# Patient Record
Sex: Female | Born: 1945 | Marital: Single | Smoking: Former smoker
Health system: Southern US, Community
[De-identification: ages and names within clinical notes are randomized; demographics above are authoritative.]

## PROBLEM LIST (undated history)

## (undated) ENCOUNTER — Emergency Department (HOSPITAL_COMMUNITY): Disposition: A | Discharge status: Other Acute Inpatient Hospital | Payer: Medicare HMO

## (undated) DIAGNOSIS — I639 Cerebral infarction, unspecified: Secondary | ICD-10-CM

## (undated) DIAGNOSIS — C801 Malignant (primary) neoplasm, unspecified: Secondary | ICD-10-CM

## (undated) HISTORY — PX: ANKLE FRACTURE SURGERY: SHX122

---

## 2015-06-02 ENCOUNTER — Emergency Department (HOSPITAL_COMMUNITY): Payer: Medicare HMO

## 2015-06-02 ENCOUNTER — Inpatient Hospital Stay (HOSPITAL_COMMUNITY)
Admission: EM | Admit: 2015-06-02 | Discharge: 2015-06-14 | DRG: 576 | Disposition: A | Discharge status: Skilled Nursing Facility | Payer: Medicare HMO | Attending: Internal Medicine | Admitting: Internal Medicine

## 2015-06-02 ENCOUNTER — Inpatient Hospital Stay (HOSPITAL_COMMUNITY): Payer: Medicare HMO | Admitting: Anesthesiology

## 2015-06-02 ENCOUNTER — Encounter (HOSPITAL_COMMUNITY): Payer: Self-pay | Admitting: Emergency Medicine

## 2015-06-02 ENCOUNTER — Encounter (HOSPITAL_COMMUNITY)
Admission: EM | Disposition: A | Discharge status: Skilled Nursing Facility | Payer: Self-pay | Source: Home / Self Care | Attending: Family Medicine

## 2015-06-02 ENCOUNTER — Encounter: Payer: Self-pay | Admitting: *Deleted

## 2015-06-02 DIAGNOSIS — I779 Disorder of arteries and arterioles, unspecified: Secondary | ICD-10-CM | POA: Diagnosis not present

## 2015-06-02 DIAGNOSIS — I7419 Embolism and thrombosis of other parts of aorta: Secondary | ICD-10-CM | POA: Diagnosis present

## 2015-06-02 DIAGNOSIS — M314 Aortic arch syndrome [Takayasu]: Secondary | ICD-10-CM

## 2015-06-02 DIAGNOSIS — D62 Acute posthemorrhagic anemia: Secondary | ICD-10-CM | POA: Diagnosis not present

## 2015-06-02 DIAGNOSIS — N179 Acute kidney failure, unspecified: Secondary | ICD-10-CM | POA: Diagnosis present

## 2015-06-02 DIAGNOSIS — K5900 Constipation, unspecified: Secondary | ICD-10-CM | POA: Diagnosis not present

## 2015-06-02 DIAGNOSIS — Z6821 Body mass index (BMI) 21.0-21.9, adult: Secondary | ICD-10-CM

## 2015-06-02 DIAGNOSIS — E279 Disorder of adrenal gland, unspecified: Secondary | ICD-10-CM

## 2015-06-02 DIAGNOSIS — Z87891 Personal history of nicotine dependence: Secondary | ICD-10-CM | POA: Diagnosis not present

## 2015-06-02 DIAGNOSIS — E876 Hypokalemia: Secondary | ICD-10-CM | POA: Diagnosis present

## 2015-06-02 DIAGNOSIS — C799 Secondary malignant neoplasm of unspecified site: Secondary | ICD-10-CM | POA: Diagnosis not present

## 2015-06-02 DIAGNOSIS — Z510 Encounter for antineoplastic radiation therapy: Secondary | ICD-10-CM | POA: Diagnosis present

## 2015-06-02 DIAGNOSIS — G936 Cerebral edema: Secondary | ICD-10-CM | POA: Diagnosis present

## 2015-06-02 DIAGNOSIS — N6320 Unspecified lump in the left breast, unspecified quadrant: Secondary | ICD-10-CM | POA: Diagnosis present

## 2015-06-02 DIAGNOSIS — D72829 Elevated white blood cell count, unspecified: Secondary | ICD-10-CM | POA: Diagnosis present

## 2015-06-02 DIAGNOSIS — I361 Nonrheumatic tricuspid (valve) insufficiency: Secondary | ICD-10-CM | POA: Diagnosis not present

## 2015-06-02 DIAGNOSIS — H547 Unspecified visual loss: Secondary | ICD-10-CM | POA: Diagnosis present

## 2015-06-02 DIAGNOSIS — R160 Hepatomegaly, not elsewhere classified: Secondary | ICD-10-CM | POA: Diagnosis not present

## 2015-06-02 DIAGNOSIS — D638 Anemia in other chronic diseases classified elsewhere: Secondary | ICD-10-CM

## 2015-06-02 DIAGNOSIS — E43 Unspecified severe protein-calorie malnutrition: Secondary | ICD-10-CM | POA: Diagnosis present

## 2015-06-02 DIAGNOSIS — D329 Benign neoplasm of meninges, unspecified: Secondary | ICD-10-CM | POA: Diagnosis present

## 2015-06-02 DIAGNOSIS — S21002A Unspecified open wound of left breast, initial encounter: Secondary | ICD-10-CM

## 2015-06-02 DIAGNOSIS — E872 Acidosis: Secondary | ICD-10-CM | POA: Diagnosis present

## 2015-06-02 DIAGNOSIS — N61 Mastitis without abscess: Secondary | ICD-10-CM | POA: Diagnosis present

## 2015-06-02 DIAGNOSIS — C7949 Secondary malignant neoplasm of other parts of nervous system: Secondary | ICD-10-CM

## 2015-06-02 DIAGNOSIS — R001 Bradycardia, unspecified: Secondary | ICD-10-CM | POA: Diagnosis present

## 2015-06-02 DIAGNOSIS — C50919 Malignant neoplasm of unspecified site of unspecified female breast: Secondary | ICD-10-CM | POA: Diagnosis present

## 2015-06-02 DIAGNOSIS — C801 Malignant (primary) neoplasm, unspecified: Secondary | ICD-10-CM | POA: Diagnosis not present

## 2015-06-02 DIAGNOSIS — D7589 Other specified diseases of blood and blood-forming organs: Secondary | ICD-10-CM | POA: Diagnosis present

## 2015-06-02 DIAGNOSIS — I639 Cerebral infarction, unspecified: Secondary | ICD-10-CM | POA: Diagnosis present

## 2015-06-02 DIAGNOSIS — C50112 Malignant neoplasm of central portion of left female breast: Secondary | ICD-10-CM | POA: Diagnosis present

## 2015-06-02 DIAGNOSIS — R7881 Bacteremia: Secondary | ICD-10-CM | POA: Diagnosis not present

## 2015-06-02 DIAGNOSIS — C50912 Malignant neoplasm of unspecified site of left female breast: Secondary | ICD-10-CM | POA: Diagnosis not present

## 2015-06-02 DIAGNOSIS — N644 Mastodynia: Secondary | ICD-10-CM | POA: Diagnosis present

## 2015-06-02 DIAGNOSIS — N63 Unspecified lump in breast: Secondary | ICD-10-CM

## 2015-06-02 DIAGNOSIS — C50812 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: Principal | ICD-10-CM | POA: Diagnosis present

## 2015-06-02 DIAGNOSIS — C50011 Malignant neoplasm of nipple and areola, right female breast: Secondary | ICD-10-CM | POA: Diagnosis not present

## 2015-06-02 DIAGNOSIS — I7410 Embolism and thrombosis of unspecified parts of aorta: Secondary | ICD-10-CM | POA: Diagnosis not present

## 2015-06-02 DIAGNOSIS — C7931 Secondary malignant neoplasm of brain: Secondary | ICD-10-CM | POA: Diagnosis present

## 2015-06-02 DIAGNOSIS — E860 Dehydration: Secondary | ICD-10-CM | POA: Diagnosis present

## 2015-06-02 DIAGNOSIS — Z9012 Acquired absence of left breast and nipple: Secondary | ICD-10-CM | POA: Diagnosis not present

## 2015-06-02 DIAGNOSIS — D509 Iron deficiency anemia, unspecified: Secondary | ICD-10-CM | POA: Diagnosis present

## 2015-06-02 DIAGNOSIS — Z5900 Homelessness unspecified: Secondary | ICD-10-CM

## 2015-06-02 HISTORY — PX: TOTAL MASTECTOMY: SHX6129

## 2015-06-02 LAB — CBC
HCT: 25 % — ABNORMAL LOW (ref 36.0–46.0)
HEMOGLOBIN: 8.1 g/dL — AB (ref 12.0–15.0)
MCH: 24.3 pg — AB (ref 26.0–34.0)
MCHC: 32.4 g/dL (ref 30.0–36.0)
MCV: 75.1 fL — ABNORMAL LOW (ref 78.0–100.0)
Platelets: 512 10*3/uL — ABNORMAL HIGH (ref 150–400)
RBC: 3.33 MIL/uL — AB (ref 3.87–5.11)
RDW: 14.1 % (ref 11.5–15.5)
WBC: 14.2 10*3/uL — AB (ref 4.0–10.5)

## 2015-06-02 LAB — BASIC METABOLIC PANEL
Anion gap: 14 (ref 5–15)
BUN: 29 mg/dL — AB (ref 6–20)
CALCIUM: 8.6 mg/dL — AB (ref 8.9–10.3)
CO2: 19 mmol/L — AB (ref 22–32)
CREATININE: 1.41 mg/dL — AB (ref 0.44–1.00)
Chloride: 103 mmol/L (ref 101–111)
GFR calc Af Amer: 44 mL/min — ABNORMAL LOW (ref >60)
GFR calc non Af Amer: 38 mL/min — ABNORMAL LOW (ref >60)
GLUCOSE: 87 mg/dL (ref 65–99)
Potassium: 4.6 mmol/L (ref 3.5–5.1)
Sodium: 136 mmol/L (ref 135–145)

## 2015-06-02 LAB — RAPID URINE DRUG SCREEN, HOSP PERFORMED
Amphetamines: NOT DETECTED
BARBITURATES: NOT DETECTED
Benzodiazepines: NOT DETECTED
Cocaine: NOT DETECTED
Opiates: NOT DETECTED
Tetrahydrocannabinol: NOT DETECTED

## 2015-06-02 LAB — CBC WITH DIFFERENTIAL/PLATELET
BASOS PCT: 0 %
Basophils Absolute: 0 10*3/uL (ref 0.0–0.1)
EOS ABS: 0 10*3/uL (ref 0.0–0.7)
Eosinophils Relative: 0 %
HCT: 28.8 % — ABNORMAL LOW (ref 36.0–46.0)
Hemoglobin: 9.3 g/dL — ABNORMAL LOW (ref 12.0–15.0)
Lymphocytes Relative: 7 %
Lymphs Abs: 1.2 10*3/uL (ref 0.7–4.0)
MCH: 24.2 pg — AB (ref 26.0–34.0)
MCHC: 32.3 g/dL (ref 30.0–36.0)
MCV: 75 fL — AB (ref 78.0–100.0)
MONOS PCT: 4 %
Monocytes Absolute: 0.8 10*3/uL (ref 0.1–1.0)
NEUTROS ABS: 15.9 10*3/uL — AB (ref 1.7–7.7)
NEUTROS PCT: 89 %
PLATELETS: 553 10*3/uL — AB (ref 150–400)
RBC: 3.84 MIL/uL — ABNORMAL LOW (ref 3.87–5.11)
RDW: 14.2 % (ref 11.5–15.5)
WBC: 17.9 10*3/uL — AB (ref 4.0–10.5)

## 2015-06-02 LAB — COMPREHENSIVE METABOLIC PANEL
ALK PHOS: 103 U/L (ref 38–126)
ALT: 17 U/L (ref 14–54)
ANION GAP: 10 (ref 5–15)
AST: 14 U/L — ABNORMAL LOW (ref 15–41)
Albumin: 1.6 g/dL — ABNORMAL LOW (ref 3.5–5.0)
BUN: 25 mg/dL — ABNORMAL HIGH (ref 6–20)
CALCIUM: 7.5 mg/dL — AB (ref 8.9–10.3)
CO2: 20 mmol/L — AB (ref 22–32)
Chloride: 107 mmol/L (ref 101–111)
Creatinine, Ser: 1.45 mg/dL — ABNORMAL HIGH (ref 0.44–1.00)
GFR, EST AFRICAN AMERICAN: 42 mL/min — AB (ref >60)
GFR, EST NON AFRICAN AMERICAN: 36 mL/min — AB (ref >60)
Glucose, Bld: 113 mg/dL — ABNORMAL HIGH (ref 65–99)
Potassium: 3.3 mmol/L — ABNORMAL LOW (ref 3.5–5.1)
SODIUM: 137 mmol/L (ref 135–145)
Total Bilirubin: 0.5 mg/dL (ref 0.3–1.2)
Total Protein: 5.3 g/dL — ABNORMAL LOW (ref 6.5–8.1)

## 2015-06-02 LAB — HIV ANTIBODY (ROUTINE TESTING W REFLEX): HIV SCREEN 4TH GENERATION: NONREACTIVE

## 2015-06-02 LAB — PROTIME-INR
INR: 1.26 (ref 0.00–1.49)
Prothrombin Time: 15.9 seconds — ABNORMAL HIGH (ref 11.6–15.2)

## 2015-06-02 LAB — GLUCOSE, CAPILLARY: Glucose-Capillary: 133 mg/dL — ABNORMAL HIGH (ref 65–99)

## 2015-06-02 LAB — ABO/RH: ABO/RH(D): O POS

## 2015-06-02 LAB — I-STAT CG4 LACTIC ACID, ED: LACTIC ACID, VENOUS: 1.43 mmol/L (ref 0.5–2.0)

## 2015-06-02 LAB — APTT: aPTT: 45 seconds — ABNORMAL HIGH (ref 24–37)

## 2015-06-02 LAB — ETHANOL

## 2015-06-02 LAB — SURGICAL PCR SCREEN
MRSA, PCR: NEGATIVE
STAPHYLOCOCCUS AUREUS: NEGATIVE

## 2015-06-02 SURGERY — MASTECTOMY, SIMPLE
Anesthesia: General | Site: Breast | Laterality: Left

## 2015-06-02 MED ORDER — EPHEDRINE SULFATE 50 MG/ML IJ SOLN
INTRAMUSCULAR | Status: DC | PRN
  Administered 2015-06-02: 10 mg via INTRAVENOUS

## 2015-06-02 MED ORDER — ACETAMINOPHEN 650 MG RE SUPP: 650.0000 mg | Freq: Four times a day (QID) | RECTAL | Status: DC | PRN

## 2015-06-02 MED ORDER — NEOSTIGMINE METHYLSULFATE 10 MG/10ML IV SOLN
INTRAVENOUS | Status: DC | PRN
Start: 2015-06-02 — End: 2015-06-02
  Administered 2015-06-02: 1 mg via INTRAVENOUS
  Administered 2015-06-02: 4 mg via INTRAVENOUS

## 2015-06-02 MED ORDER — SODIUM CHLORIDE 0.9 % IV SOLN
3.0000 g | Freq: Four times a day (QID) | INTRAVENOUS | Status: DC
  Administered 2015-06-02 – 2015-06-14 (×46): 3 g via INTRAVENOUS
  Filled 2015-06-02 (×53): qty 3

## 2015-06-02 MED ORDER — MORPHINE SULFATE (PF) 2 MG/ML IV SOLN
2.0000 mg | INTRAVENOUS | Status: DC | PRN
  Administered 2015-06-05 – 2015-06-07 (×3): 2 mg via INTRAVENOUS
  Filled 2015-06-02 (×3): qty 1

## 2015-06-02 MED ORDER — ONDANSETRON HCL 4 MG/2ML IJ SOLN
INTRAMUSCULAR | Status: AC
  Filled 2015-06-02: qty 2

## 2015-06-02 MED ORDER — SODIUM CHLORIDE 0.9% FLUSH
3.0000 mL | Freq: Two times a day (BID) | INTRAVENOUS | Status: DC
  Administered 2015-06-02 – 2015-06-13 (×9): 3 mL via INTRAVENOUS

## 2015-06-02 MED ORDER — NEOSTIGMINE METHYLSULFATE 10 MG/10ML IV SOLN
INTRAVENOUS | Status: AC
  Filled 2015-06-02: qty 1

## 2015-06-02 MED ORDER — SODIUM CHLORIDE 0.9 % IV BOLUS (SEPSIS)
2000.0000 mL | Freq: Once | INTRAVENOUS | Status: AC
  Administered 2015-06-02: 2000 mL via INTRAVENOUS

## 2015-06-02 MED ORDER — MIDAZOLAM HCL 2 MG/2ML IJ SOLN
INTRAMUSCULAR | Status: AC
  Filled 2015-06-02: qty 2

## 2015-06-02 MED ORDER — VANCOMYCIN HCL IN DEXTROSE 1-5 GM/200ML-% IV SOLN
1000.0000 mg | INTRAVENOUS | Status: DC
  Administered 2015-06-03 – 2015-06-05 (×3): 1000 mg via INTRAVENOUS
  Filled 2015-06-02 (×3): qty 200

## 2015-06-02 MED ORDER — VANCOMYCIN HCL 10 G IV SOLR
1500.0000 mg | Freq: Once | INTRAVENOUS | Status: AC
  Administered 2015-06-02: 1500 mg via INTRAVENOUS
  Filled 2015-06-02: qty 1500

## 2015-06-02 MED ORDER — OXYCODONE-ACETAMINOPHEN 5-325 MG PO TABS
ORAL_TABLET | ORAL | Status: AC
  Filled 2015-06-02: qty 2

## 2015-06-02 MED ORDER — SUCCINYLCHOLINE CHLORIDE 20 MG/ML IJ SOLN
INTRAMUSCULAR | Status: DC | PRN
  Administered 2015-06-02: 100 mg via INTRAVENOUS

## 2015-06-02 MED ORDER — PROPOFOL 10 MG/ML IV BOLUS
INTRAVENOUS | Status: DC | PRN
  Administered 2015-06-02: 30 mg via INTRAVENOUS
  Administered 2015-06-02: 150 mg via INTRAVENOUS

## 2015-06-02 MED ORDER — OXYCODONE-ACETAMINOPHEN 5-325 MG PO TABS
1.0000 | ORAL_TABLET | ORAL | Status: DC | PRN
  Administered 2015-06-02 – 2015-06-09 (×6): 2 via ORAL
  Administered 2015-06-10 – 2015-06-11 (×3): 1 via ORAL
  Administered 2015-06-12: 2 via ORAL
  Administered 2015-06-12 – 2015-06-13 (×2): 1 via ORAL
  Administered 2015-06-14: 2 via ORAL
  Filled 2015-06-02: qty 2
  Filled 2015-06-02 (×4): qty 1
  Filled 2015-06-02: qty 2
  Filled 2015-06-02: qty 1
  Filled 2015-06-02 (×6): qty 2

## 2015-06-02 MED ORDER — GLYCOPYRROLATE 0.2 MG/ML IJ SOLN
INTRAMUSCULAR | Status: DC | PRN
  Administered 2015-06-02: .15 mg via INTRAVENOUS
  Administered 2015-06-02: 0.6 mg via INTRAVENOUS

## 2015-06-02 MED ORDER — ONDANSETRON HCL 4 MG PO TABS: 4.0000 mg | ORAL_TABLET | Freq: Four times a day (QID) | ORAL | Status: DC | PRN

## 2015-06-02 MED ORDER — FENTANYL CITRATE (PF) 250 MCG/5ML IJ SOLN
INTRAMUSCULAR | Status: AC
  Filled 2015-06-02: qty 5

## 2015-06-02 MED ORDER — FENTANYL CITRATE (PF) 100 MCG/2ML IJ SOLN
INTRAMUSCULAR | Status: DC | PRN
  Administered 2015-06-02: 100 ug via INTRAVENOUS
  Administered 2015-06-02 (×3): 50 ug via INTRAVENOUS

## 2015-06-02 MED ORDER — PIPERACILLIN-TAZOBACTAM 3.375 G IVPB 30 MIN
3.3750 g | Freq: Once | INTRAVENOUS | Status: AC
  Administered 2015-06-02: 3.375 g via INTRAVENOUS
  Filled 2015-06-02: qty 50

## 2015-06-02 MED ORDER — SUCCINYLCHOLINE CHLORIDE 20 MG/ML IJ SOLN
INTRAMUSCULAR | Status: AC
  Filled 2015-06-02: qty 1

## 2015-06-02 MED ORDER — ALBUMIN HUMAN 5 % IV SOLN
INTRAVENOUS | Status: DC | PRN
  Administered 2015-06-02: 13:00:00 via INTRAVENOUS

## 2015-06-02 MED ORDER — ONDANSETRON HCL 4 MG/2ML IJ SOLN: 4.0000 mg | Freq: Four times a day (QID) | INTRAMUSCULAR | Status: DC | PRN

## 2015-06-02 MED ORDER — ROCURONIUM BROMIDE 100 MG/10ML IV SOLN
INTRAVENOUS | Status: DC | PRN
  Administered 2015-06-02: 30 mg via INTRAVENOUS

## 2015-06-02 MED ORDER — LINEZOLID 600 MG/300ML IV SOLN
600.0000 mg | Freq: Once | INTRAVENOUS | Status: AC
  Administered 2015-06-02: 600 mg via INTRAVENOUS
  Filled 2015-06-02: qty 300

## 2015-06-02 MED ORDER — MIDAZOLAM HCL 5 MG/5ML IJ SOLN
INTRAMUSCULAR | Status: DC | PRN
  Administered 2015-06-02: 1 mg via INTRAVENOUS

## 2015-06-02 MED ORDER — ACETAMINOPHEN 325 MG PO TABS: 650.0000 mg | ORAL_TABLET | Freq: Four times a day (QID) | ORAL | Status: DC | PRN

## 2015-06-02 MED ORDER — HYDROCODONE-ACETAMINOPHEN 5-325 MG PO TABS: 1.0000 | ORAL_TABLET | ORAL | Status: DC | PRN

## 2015-06-02 MED ORDER — ALUM & MAG HYDROXIDE-SIMETH 200-200-20 MG/5ML PO SUSP: 30.0000 mL | Freq: Four times a day (QID) | ORAL | Status: DC | PRN

## 2015-06-02 MED ORDER — LINEZOLID 600 MG PO TABS
600.0000 mg | ORAL_TABLET | Freq: Two times a day (BID) | ORAL | Status: DC
  Administered 2015-06-02: 600 mg via ORAL
  Filled 2015-06-02 (×2): qty 1

## 2015-06-02 MED ORDER — LACTATED RINGERS IV SOLN
INTRAVENOUS | Status: DC | PRN
  Administered 2015-06-02 (×2): via INTRAVENOUS

## 2015-06-02 MED ORDER — HYDROMORPHONE HCL 1 MG/ML IJ SOLN
0.2500 mg | INTRAMUSCULAR | Status: DC | PRN
  Administered 2015-06-02 (×2): 0.5 mg via INTRAVENOUS

## 2015-06-02 MED ORDER — DEXAMETHASONE SODIUM PHOSPHATE 10 MG/ML IJ SOLN
INTRAMUSCULAR | Status: DC | PRN
  Administered 2015-06-02: 10 mg via INTRAVENOUS

## 2015-06-02 MED ORDER — HYDROMORPHONE HCL 1 MG/ML IJ SOLN
INTRAMUSCULAR | Status: AC
  Filled 2015-06-02: qty 1

## 2015-06-02 MED ORDER — LIDOCAINE HCL (CARDIAC) 20 MG/ML IV SOLN
INTRAVENOUS | Status: AC
  Filled 2015-06-02: qty 5

## 2015-06-02 MED ORDER — GLYCOPYRROLATE 0.2 MG/ML IJ SOLN
INTRAMUSCULAR | Status: AC
  Filled 2015-06-02: qty 3

## 2015-06-02 MED ORDER — 0.9 % SODIUM CHLORIDE (POUR BTL) OPTIME
TOPICAL | Status: DC | PRN
  Administered 2015-06-02 (×2): 1000 mL

## 2015-06-02 MED ORDER — DEXAMETHASONE SODIUM PHOSPHATE 10 MG/ML IJ SOLN
INTRAMUSCULAR | Status: AC
  Filled 2015-06-02: qty 1

## 2015-06-02 MED ORDER — PROPOFOL 10 MG/ML IV BOLUS
INTRAVENOUS | Status: AC
  Filled 2015-06-02: qty 40

## 2015-06-02 MED ORDER — EPHEDRINE SULFATE 50 MG/ML IJ SOLN
INTRAMUSCULAR | Status: AC
  Filled 2015-06-02: qty 1

## 2015-06-02 MED ORDER — IOHEXOL 300 MG/ML  SOLN
75.0000 mL | Freq: Once | INTRAMUSCULAR | Status: AC | PRN
Start: 2015-06-02 — End: 2015-06-02
  Administered 2015-06-02: 75 mL via INTRAVENOUS

## 2015-06-02 MED ORDER — ROCURONIUM BROMIDE 50 MG/5ML IV SOLN
INTRAVENOUS | Status: AC
  Filled 2015-06-02: qty 1

## 2015-06-02 MED ORDER — PROMETHAZINE HCL 25 MG/ML IJ SOLN: 6.2500 mg | INTRAMUSCULAR | Status: DC | PRN

## 2015-06-02 MED ORDER — SODIUM CHLORIDE 0.9 % IV SOLN
INTRAVENOUS | Status: DC
  Administered 2015-06-02: 05:00:00 via INTRAVENOUS
  Administered 2015-06-03: 100 mL via INTRAVENOUS
  Administered 2015-06-05 – 2015-06-10 (×3): via INTRAVENOUS

## 2015-06-02 MED ORDER — LIDOCAINE HCL (CARDIAC) 20 MG/ML IV SOLN
INTRAVENOUS | Status: DC | PRN
  Administered 2015-06-02: 60 mg via INTRAVENOUS

## 2015-06-02 SURGICAL SUPPLY — 43 items
APPLIER CLIP 9.375 MED OPEN (MISCELLANEOUS) ×6
BINDER BREAST LRG (GAUZE/BANDAGES/DRESSINGS) IMPLANT
BINDER BREAST XLRG (GAUZE/BANDAGES/DRESSINGS) IMPLANT
CANISTER SUCTION 2500CC (MISCELLANEOUS) ×3 IMPLANT
CANISTER WOUND CARE 500ML ATS (WOUND CARE) ×3 IMPLANT
CHLORAPREP W/TINT 26ML (MISCELLANEOUS) IMPLANT
CLIP APPLIE 9.375 MED OPEN (MISCELLANEOUS) ×2 IMPLANT
COVER SURGICAL LIGHT HANDLE (MISCELLANEOUS) ×3 IMPLANT
DRAIN CHANNEL 19F RND (DRAIN) IMPLANT
DRAPE LAPAROSCOPIC ABDOMINAL (DRAPES) ×3 IMPLANT
DRAPE UTILITY XL STRL (DRAPES) IMPLANT
DRSG VAC ATS LRG SENSATRAC (GAUZE/BANDAGES/DRESSINGS) ×3 IMPLANT
ELECT CAUTERY BLADE 6.4 (BLADE) ×3 IMPLANT
ELECT REM PT RETURN 9FT ADLT (ELECTROSURGICAL) ×3
ELECTRODE REM PT RTRN 9FT ADLT (ELECTROSURGICAL) ×1 IMPLANT
EVACUATOR SILICONE 100CC (DRAIN) IMPLANT
GLOVE BIO SURGEON STRL SZ 6 (GLOVE) ×3 IMPLANT
GLOVE BIO SURGEON STRL SZ7 (GLOVE) ×12 IMPLANT
GLOVE BIOGEL PI IND STRL 6.5 (GLOVE) ×3 IMPLANT
GLOVE BIOGEL PI IND STRL 7.0 (GLOVE) ×1 IMPLANT
GLOVE BIOGEL PI IND STRL 7.5 (GLOVE) ×1 IMPLANT
GLOVE BIOGEL PI INDICATOR 6.5 (GLOVE) ×6
GLOVE BIOGEL PI INDICATOR 7.0 (GLOVE) ×2
GLOVE BIOGEL PI INDICATOR 7.5 (GLOVE) ×2
GLOVE SURG SS PI 6.5 STRL IVOR (GLOVE) ×3 IMPLANT
GOWN STRL REUS W/ TWL LRG LVL3 (GOWN DISPOSABLE) ×4 IMPLANT
GOWN STRL REUS W/ TWL XL LVL3 (GOWN DISPOSABLE) ×1 IMPLANT
GOWN STRL REUS W/TWL LRG LVL3 (GOWN DISPOSABLE) ×8
GOWN STRL REUS W/TWL XL LVL3 (GOWN DISPOSABLE) ×2
KIT BASIN OR (CUSTOM PROCEDURE TRAY) ×3 IMPLANT
KIT ROOM TURNOVER OR (KITS) ×3 IMPLANT
NS IRRIG 1000ML POUR BTL (IV SOLUTION) ×3 IMPLANT
PACK GENERAL/GYN (CUSTOM PROCEDURE TRAY) ×3 IMPLANT
PAD ARMBOARD 7.5X6 YLW CONV (MISCELLANEOUS) ×3 IMPLANT
SPECIMEN JAR LARGE (MISCELLANEOUS) ×3 IMPLANT
SPECIMEN JAR X LARGE (MISCELLANEOUS) IMPLANT
SPONGE GAUZE 4X4 12PLY STER LF (GAUZE/BANDAGES/DRESSINGS) IMPLANT
SPONGE LAP 18X18 X RAY DECT (DISPOSABLE) ×3 IMPLANT
SUT ETHILON 3 0 FSL (SUTURE) IMPLANT
SUT VIC AB 3-0 SH 18 (SUTURE) ×6 IMPLANT
TOWEL OR 17X24 6PK STRL BLUE (TOWEL DISPOSABLE) ×3 IMPLANT
TOWEL OR 17X26 10 PK STRL BLUE (TOWEL DISPOSABLE) ×3 IMPLANT
WATER STERILE IRR 1000ML POUR (IV SOLUTION) ×3 IMPLANT

## 2015-06-02 NOTE — Care Management Note (Signed)
Case Management Note  Patient Details  Name: Haneen Ratts MRN: [id number] Date of Birth: 01/20/1946  Subjective/Objective:     Adm w breast infection               Action/Plan: lives in car per chart, sw ref made   Expected Discharge Date:                  Expected Discharge Plan:     In-House Referral:  Clinical Social Work  Discharge planning Services  CM Consult  Post Acute Care Choice:    Choice offered to:     DME Arranged:    DME Agency:     HH Arranged:    [place] Agency:     Status of Service:     Medicare Important Message Given:    Date Medicare IM Given:    Medicare IM give by:    Date Additional Medicare IM Given:    Additional Medicare Important Message give by:     If discussed at [place] of Stay Meetings, dates discussed:    Additional Comments: ur review done  Lacretia Leigh, RN 06/02/2015, 8:22 AM

## 2015-06-02 NOTE — Consult Note (Signed)
Reason for Referral: Breast mass.   HPI: 70 year old woman currently of [place] and have not been seeking medical care routinely. She had been homeless and living in her truck for the last few months. She was seen in the emergency department on 06/01/2015 after she noticed a left breast lesion and purulent drainage. She was found to have a large necrotic left breast mass as well as leukocytosis, thrombocytosis and fever. CT scan of the chest showed a large breast mass consistent with malignancy with axillary lymphadenopathy. She was also noted to have 3 cm visitation attached to the ascending aortic wall. There are intermediate liver and adrenal masses suspicious for malignancy. Infectious disease evaluated the patient for possible endocarditis and have been started on broad-spectrum antibiotics. Patient was evaluated by Gen. surgery and underwent salvage mastectomy and wound VAC placement.   She did report fevers, chills and 20 pound weight loss. The remaining review of system was not able to be obtained. She is not able to give much history at this time as she is recovering from her recent surgery and anesthesia.    History reviewed. No pertinent past medical history.:  Past Surgical History  Procedure Laterality Date  . Ankle fracture surgery    :   Current facility-administered medications:  .  0.9 %  sodium chloride infusion, , Intravenous, Continuous, Ivor Costa, MD, Stopped at 06/02/15 1215 .  0.9 % irrigation (POUR BTL), , , PRN, Donnie Mesa, MD, 1,000 mL at 06/02/15 1257 .  [MAR Hold] acetaminophen (TYLENOL) tablet 650 mg, 650 mg, Oral, Q6H PRN **OR** [MAR Hold] acetaminophen (TYLENOL) suppository 650 mg, 650 mg, Rectal, Q6H PRN, Ivor Costa, MD .  Doug Sou Hold] alum & mag hydroxide-simeth (MAALOX/MYLANTA) 200-200-20 MG/5ML suspension 30 mL, 30 mL, Oral, Q6H PRN, Ivor Costa, MD .  Doug Sou Hold] Ampicillin-Sulbactam (UNASYN) 3 g in sodium chloride 0.9 % 100 mL IVPB, 3 g, Intravenous, Q6H,  Ivor Costa, MD, 3 g at 06/02/15 0644 .  [MAR Hold] HYDROcodone-acetaminophen (NORCO/VICODIN) 5-325 MG per tablet 1-2 tablet, 1-2 tablet, Oral, Q4H PRN, Ivor Costa, MD .  Doug Sou Hold] linezolid (ZYVOX) tablet 600 mg, 600 mg, Oral, Q12H, Ivor Costa, MD .  Doug Sou Hold] morphine 2 MG/ML injection 2 mg, 2 mg, Intravenous, Q4H PRN, Ivor Costa, MD .  Doug Sou Hold] ondansetron [facility]) tablet 4 mg, 4 mg, Oral, Q6H PRN **OR** [MAR Hold] ondansetron (ZOFRAN) injection 4 mg, 4 mg, Intravenous, Q6H PRN, Ivor Costa, MD .  Doug Sou Hold] sodium chloride flush (NS) 0.9 % injection 3 mL, 3 mL, Intravenous, Q12H, Ivor Costa, MD  Facility-Administered Medications Ordered in Other Encounters:  .  albumin human 5 % solution, , , Continuous PRN, Terrill Mohr, CRNA, Stopped at 06/02/15 1302 .  dexamethasone (DECADRON) injection, , , Anesthesia Intra-op, Terrill Mohr, CRNA, 10 mg at 06/02/15 1220 .  ePHEDrine injection, , , Anesthesia Intra-op, Terrill Mohr, CRNA, 10 mg at 06/02/15 1302 .  fentaNYL (SUBLIMAZE) injection, , , Anesthesia Intra-op, Terrill Mohr, CRNA, 50 mcg at 06/02/15 1249 .  glycopyrrolate (ROBINUL) injection, , , Anesthesia Intra-op, Terrill Mohr, CRNA, 0.6 mg at 06/02/15 1318 .  lactated ringers infusion, , , Continuous PRN, Terrill Mohr, CRNA .  lidocaine (cardiac) 100 mg/75ml (XYLOCAINE) 20 MG/ML injection 2%, , , Anesthesia Intra-op, Terrill Mohr, CRNA, 60 mg at 06/02/15 1153 .  midazolam (VERSED) 5 MG/5ML injection, , , Anesthesia Intra-op, Terrill Mohr, CRNA, 1 mg at 06/02/15 1145 .  neostigmine (BLOXIVERZ) injection, ,  Intravenous, Anesthesia Intra-op, Terrill Mohr, CRNA, 4 mg at 06/02/15 1318 .  propofol (DIPRIVAN) 10 mg/mL bolus/IV push, , , Anesthesia Intra-op, Terrill Mohr, CRNA, 30 mg at 06/02/15 1209 .  rocuronium (ZEMURON) injection, , , Anesthesia Intra-op, Terrill Mohr, CRNA, 30 mg at 06/02/15 1209 .  succinylcholine (ANECTINE) injection, , , Anesthesia Intra-op, Terrill Mohr, CRNA, 100 mg at  06/02/15 1156:  No Known Allergies:  History reviewed. No pertinent family history.:  Social History   Social History  . Marital Status: Single    Spouse Name: N/A  . Number of Children: N/A  . Years of Education: N/A   Occupational History  . Not on file.   Social History Main Topics  . Smoking status: Former Research scientist (life sciences)  . Smokeless tobacco: Not on file  . Alcohol Use: No  . Drug Use: No  . Sexual Activity: Not on file   Other Topics Concern  . Not on file   Social History Narrative  . No narrative on file  :  Pertinent items are noted in HPI.  Exam: ECOG 1 Blood pressure 122/68, pulse 77, temperature 98.8 F (37.1 C), temperature source Oral, resp. rate 19, height 5\' 8"  (1.727 m), weight 139 lb 12.4 oz (63.4 kg), SpO2 100 %. General appearance: Slightly somnolent but appeared without distress. She was answering some questions appropriately. Head: Normocephalic, without obvious abnormality Throat: lips, mucosa, and tongue normal; teeth and gums normal no oral ulcers or lesions. Neck: no adenopathy Back: negative Resp: clear to auscultation bilaterally Chest wall: no tenderness. No tenderness or masses noted. Breasts: Normal right breast. Left breast status post mastectomy with a wound VAC in place. Cardio: regular rate and rhythm, S1, S2 normal, no murmur, click, rub or gallop Extremities: extremities normal, atraumatic, no cyanosis or edema Pulses: 2+ and symmetric Skin: Skin color, texture, turgor normal. No rashes or lesions Lymph nodes: Cervical, supraclavicular, and axillary nodes normal.   Recent Labs  06/02/15 0120 06/02/15 0540  WBC 17.9* 14.2*  HGB 9.3* 8.1*  HCT 28.8* 25.0*  PLT 553* 512*    Recent Labs  06/02/15 0120 06/02/15 0540  NA 136 137  K 4.6 3.3*  CL 103 107  CO2 19* 20*  GLUCOSE 87 113*  BUN 29* 25*  CREATININE 1.41* 1.45*  CALCIUM 8.6* 7.5*      Ct Chest W Contrast  06/02/2015  CLINICAL DATA:  Evaluate for left breast  mass versus malignancy. EXAM: CT CHEST WITH CONTRAST TECHNIQUE: Multidetector CT imaging of the chest was performed during intravenous contrast administration. CONTRAST:  64mL OMNIPAQUE IOHEXOL 300 MG/ML  SOLN COMPARISON:  None. FINDINGS: THORACIC INLET/BODY WALL: The entire left breast is affected by a broad-based ulcerated mass with peripheral enhancement, heaped up margins, and nonenhancing debris in the ulcer base. Maximal diameter is approximately 13 cm. No drainable fluid collection. This is has the appearance of a necrotic malignancy. There is likely involvement of the pectoralis muscles but no bony invasion noted. Left axillary nodes are within normal limits in size, but asymmetric and suspicious soley based on the size of breast mass. No right breast lesion is seen. MEDIASTINUM: No cardiomegaly or pericardial effusion. Atherosclerosis, including the coronary arteries. There is a hypodense filling defect attached to the left wall of the ascending aorta which measures up to 3 cm. On reformats, this appears to have a narrow base along the wall which is not otherwise remarkable for thickening or calcification. Atherosclerosis is present along the aorta and  great vessels, without dissection or other aerated notable mural thrombus. There is no indication of valvular disease/vegetation. No lymphadenopathy, including along the left IMA distribution LUNG WINDOWS: Mild centrilobular emphysema. There is no edema, consolidation, effusion, or pneumothorax. No suspicious nodule. UPPER ABDOMEN: There is a 10 mm low-density in the upper left liver which does not measured cystic density. Indeterminate 13 mm right adrenal mass. OSSEOUS: No acute fracture.  No suspicious lytic or blastic lesions. IMPRESSION: 1. 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 As questioned clinically, no abscess.Small but asymmetric left axillary lymph nodes. 2. 3 cm vegetation attached to the ascending aortic wall. This would be unusual  for bland atherosclerotic thrombus, correlate for systemic infection/bacteremia. 3. Indeterminate liver and right adrenal masses. Recommend abdominal staging after workup of #1. Electronically Signed   By: Monte Fantasia M.D.   On: 06/02/2015 03:24    Assessment and Plan:   70 year old woman with the following issues:  1. Left breast mass presented with a large fungating tumor. This will likely be locally advanced breast cancer and possibly metastatic if imaging studies of the abdomen and pelvis showed possible adrenal and liver metastasis. She is s/p salvage mastectomy for palliative purposes given the large tumor and the severe infection and impending sepsis.  After the pathology is available, the characteristics of the tumor will certainly dictate therapeutic options. It is very likely we are dealing with  incurable malignancy and likely we are dealing with more palliative options. If it is a hormone positive tumor, her disease can be palliated for an extended period of time using hormone therapy.  It is also possible also that we are dealing with a rather aggressive triple negative tumor with widespread metastasis that carries a rather poor prognosis.  Any palliative options to be offered will be at a later date once she recovered from surgery and have recovered from her recent infections.  She will definitely require aggressive psychosocial support before any treatment take place given her desperate social situation.   2. Aortic arch vegetation: Further workup and evaluation will require a TTE December recommendation from infectious disease.  3. Leukocytosis: Likely related to infectious process and reactive related to malignancy.  4. Microcytic anemia: Likely multifactorial with element of possible iron deficiency versus nutritional deficiency and chronic disease. Iron studies will be helpful to replace any deficiencies.  5. Prognosis: This will be discussed at a later date once more  information is available.   She would benefit from palliative medicine consult to help with goals of care early on in the process. She will also benefit from social work evaluation to assist her moving forward upon discharge.

## 2015-06-02 NOTE — Evaluation (Signed)
Physical Therapy Evaluation Patient Details Name: Robin Cantrell MRN: [id number] DOB: 1946/03/20 Today's Date: 06/02/2015   History of Present Illness  patient is a 70 yo female whoe presents with Infection of left breast, possible left breast cancer and vegetation in ascending aorta.  Clinical Impression  Patient demonstrates deficits in functional mobility as indicated below. Patient will benefit from continued skilled PT to address deficits and maximize function. Will see as indicated and progress as tolerated.  OF NOTE: Patient reporting increased dizziness (light headed) with blurred vision during mobility. BPs assessed (negative orthostatic response). As patient returned to room and was instructed to sit in chair, patient attempted to sit on bedside table, states she could not see chair.     Follow Up Recommendations SNF;Supervision/Assistance - 24 hour    Equipment Recommendations  Other (comment) (TBD)    Recommendations for Other Services       Precautions / Restrictions Precautions Precautions: Fall Restrictions Weight Bearing Restrictions: No      Mobility  Bed Mobility Overal bed mobility: Needs Assistance Bed Mobility: Supine to Sit     Supine to sit: Min guard     General bed mobility comments: VCs for safety, patient impulsive with poor understanding of environment  Transfers Overall transfer level: Needs assistance Equipment used: 1 person hand held assist Transfers: Sit to/from [facility] Sit to Stand: Min assist Stand pivot transfers: Min guard       General transfer comment: VCs for hand placement and safety. Patient with some instability upon standing. SPT to [facility] with min guard. Min assist required to stand from [facility] secondary to posterior instability.   Ambulation/Gait Ambulation/Gait assistance: Min assist Ambulation Distance (Feet): 40 Feet Assistive device: 1 person hand held assist Gait Pattern/deviations: Step-through  pattern;Decreased stride length;Scissoring;Narrow base of support;Drifts right/left Gait velocity: decreased Gait velocity interpretation: Below normal speed for age/gender General Gait Details: instability noted with minimal ambulation. increased instability with increased time in standing. patient reports + dizziness and blurred vision with upright activity. BP assessed in sitting and standing. sitting 11783 HR 92; standing 143/84 HR 100  Stairs            Wheelchair Mobility    Modified Rankin (Stroke Patients Only)       Balance Overall balance assessment: History of Falls                                           Pertinent Vitals/Pain Pain Assessment: 0-10 Pain Score: 7  Pain Location: left breast/chest Pain Descriptors / Indicators: Discomfort;Grimacing;Guarding;Sore Pain Intervention(s): Limited activity within patient's tolerance;Monitored during session;Repositioned;Relaxation    Home Living Family/patient expects to be discharged to:: Shelter/Homeless                      Prior Function Level of Independence: Independent               Hand Dominance   Dominant Hand: Right    Extremity/Trunk Assessment   Upper Extremity Assessment: Defer to OT evaluation           Lower Extremity Assessment: Generalized weakness      Cervical / Trunk Assessment: Kyphotic  Communication   Communication: No difficulties  Cognition Arousal/Alertness: Awake/alert Behavior During Therapy: Flat affect;Impulsive Overall Cognitive Status: Impaired/Different from baseline Area of Impairment: Attention;Memory;Following commands;Safety/judgement;Awareness;Problem solving   Current Attention Level: Sustained Memory: Decreased  short-term memory Following Commands: Follows one step commands with increased time Safety/Judgement: Decreased awareness of safety;Decreased awareness of deficits Awareness: Emergent Problem Solving: Slow  processing;Decreased initiation;Difficulty sequencing;Requires verbal cues;Requires tactile cues General Comments: patient alert and oriented but very tangential, easily distracted and noted confusion during session.     General Comments      Exercises        Assessment/Plan    PT Assessment Patient needs continued PT services  PT Diagnosis Difficulty walking;Abnormality of gait;Generalized weakness;Acute pain;Altered mental status   PT Problem List Decreased strength;Decreased range of motion;Decreased activity tolerance;Decreased balance;Decreased mobility;Decreased coordination;Decreased cognition;Decreased safety awareness;Decreased skin integrity;Pain  PT Treatment Interventions DME instruction;Gait training;Functional mobility training;Therapeutic activities;Therapeutic exercise;Balance training;Cognitive remediation;Patient/family education   PT Goals (Current goals can be found in the Care Plan section) Acute Rehab PT Goals Patient Stated Goal: to get better PT Goal Formulation: With patient Time For Goal Achievement: 06/16/15 Potential to Achieve Goals: Good    Frequency Min 2X/week   Barriers to discharge Decreased caregiver support      Co-evaluation               End of Session Equipment Utilized During Treatment: Gait belt Activity Tolerance: Patient tolerated treatment well;Patient limited by fatigue Patient left: in chair;with call bell/phone within reach;with chair alarm set Nurse Communication: Mobility status         Time: [id number] PT Time Calculation (min) (ACUTE ONLY): 23 min   Charges:   PT Evaluation $PT Eval Moderate Complexity: 1 Procedure PT Treatments $Therapeutic Activity: 8-22 mins   PT G CodesDuncan Dull June 15, 2015, 9:23 AM  Alben Deeds, PT DPT  [phone]

## 2015-06-02 NOTE — Anesthesia Procedure Notes (Signed)
Procedure Name: Intubation Date/Time: 06/02/2015 11:57 AM Performed by: Terrill Mohr Pre-anesthesia Checklist: Emergency Drugs available, Patient identified, Suction available and Patient being monitored Patient Re-evaluated:Patient Re-evaluated prior to inductionOxygen Delivery Method: Circle system utilized Preoxygenation: Pre-oxygenation with 100% oxygen Intubation Type: IV induction Ventilation: Mask ventilation without difficulty Laryngoscope Size: Mac and 3 Grade View: Grade I Tube type: Oral Tube size: 7.5 mm Number of attempts: 1 Airway Equipment and Method: Stylet Placement Confirmation: ETT inserted through vocal cords under direct vision,  breath sounds checked- equal and bilateral and positive ETCO2 Secured at: 22 (cm upper gum) cm Tube secured with: Tape Dental Injury: Teeth and Oropharynx as per pre-operative assessment

## 2015-06-02 NOTE — Consult Note (Signed)
Reason for Consult: left necrotic breast mass Referring Physician: Dr. Verneita Griffes   HPI: Robin Cantrell is a 70 year old female with no significant medical history expect homelessness.  She presented this AM with left breast pain.  Initially started as "a boil" about 5 months ago and progressively worsened.  She is unable to provide additional information regarding when it increased or became malodorous.  She did not nipple "sunk down" after it initially started.  Reports about 20lb of unintentional weight loss in 5 months along with chills and sweats.  Denies family history of breast cancer stating most family members died of heart failure.  She has children, but is not in contact with them.  Initial ED work up reveals WBC 17.9k afebrile, sCr 1.41.  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   We have therefore been asked to evaluate.    Repeat CBC shows a decrease in WBC to 14.2k.  Started on Unasyn and zyvox.    History reviewed. No pertinent past medical history.  Past Surgical History  Procedure Laterality Date  . Ankle fracture surgery      History reviewed. No pertinent family history.  Social History:  reports that she has quit smoking. She does not have any smokeless tobacco history on file. She reports that she does not drink alcohol or use illicit drugs.  Allergies: No Known Allergies  Medications:  Scheduled Meds: . ampicillin-sulbactam (UNASYN) IV  3 g Intravenous Q6H  . linezolid  600 mg Oral Q12H  . sodium chloride flush  3 mL Intravenous Q12H   Continuous Infusions: . sodium chloride 100 mL/hr at 06/02/15 0521   PRN Meds:.acetaminophen **OR** acetaminophen, alum & mag hydroxide-simeth, HYDROcodone-acetaminophen, morphine injection, ondansetron **OR** ondansetron (ZOFRAN) IV   Results for orders  placed or performed during the hospital encounter of 06/02/15 (from the past 48 hour(s))  CBC with Differential/Platelet     Status: Abnormal   Collection Time: 06/02/15  1:20 AM  Result Value Ref Range   WBC 17.9 (H) 4.0 - 10.5 K/uL   RBC 3.84 (L) 3.87 - 5.11 MIL/uL   Hemoglobin 9.3 (L) 12.0 - 15.0 g/dL   HCT 28.8 (L) 36.0 - 46.0 %   MCV 75.0 (L) 78.0 - 100.0 fL   MCH 24.2 (L) 26.0 - 34.0 pg   MCHC 32.3 30.0 - 36.0 g/dL   RDW 14.2 11.5 - 15.5 %   Platelets 553 (H) 150 - 400 K/uL   Neutrophils Relative % 89 %   Neutro Abs 15.9 (H) 1.7 - 7.7 K/uL   Lymphocytes Relative 7 %   Lymphs Abs 1.2 0.7 - 4.0 K/uL   Monocytes Relative 4 %   Monocytes Absolute 0.8 0.1 - 1.0 K/uL   Eosinophils Relative 0 %   Eosinophils Absolute 0.0 0.0 - 0.7 K/uL   Basophils Relative 0 %   Basophils Absolute 0.0 0.0 - 0.1 K/uL  Basic metabolic panel     Status: Abnormal   Collection Time: 06/02/15  1:20 AM  Result Value Ref Range   Sodium 136 135 - 145 mmol/L   Potassium 4.6 3.5 - 5.1 mmol/L   Chloride 103 101 - 111 mmol/L   CO2 19 (L) 22 - 32 mmol/L   Glucose, Bld 87 65 - 99 mg/dL   BUN 29 (H) 6 - 20 mg/dL   Creatinine, Ser 1.41 (H)  0.44 - 1.00 mg/dL   Calcium 8.6 (L) 8.9 - 10.3 mg/dL   GFR calc non Af Amer 38 (L) >60 mL/min   GFR calc Af Amer 44 (L) >60 mL/min    Comment: (NOTE) The eGFR has been calculated using the CKD EPI equation. This calculation has not been validated in all clinical situations. eGFR's persistently <60 mL/min signify possible Chronic Kidney Disease.    Anion gap 14 5 - 15  Ethanol     Status: None   Collection Time: 06/02/15  1:20 AM  Result Value Ref Range   Alcohol, Ethyl (B) <5 <5 mg/dL    Comment:        LOWEST DETECTABLE LIMIT FOR SERUM ALCOHOL IS 5 mg/dL FOR MEDICAL PURPOSES ONLY   I-Stat CG4 Lactic Acid, ED     Status: None   Collection Time: 06/02/15  1:26 AM  Result Value Ref Range   Lactic Acid, Venous 1.43 0.5 - 2.0 mmol/L  Urine rapid drug screen (hosp  performed)     Status: None   Collection Time: 06/02/15  3:55 AM  Result Value Ref Range   Opiates NONE DETECTED NONE DETECTED   Cocaine NONE DETECTED NONE DETECTED   Benzodiazepines NONE DETECTED NONE DETECTED   Amphetamines NONE DETECTED NONE DETECTED   Tetrahydrocannabinol NONE DETECTED NONE DETECTED   Barbiturates NONE DETECTED NONE DETECTED    Comment:        DRUG SCREEN FOR MEDICAL PURPOSES ONLY.  IF CONFIRMATION IS NEEDED FOR ANY PURPOSE, NOTIFY LAB WITHIN 5 DAYS.        LOWEST DETECTABLE LIMITS FOR URINE DRUG SCREEN Drug Class       Cutoff (ng/mL) Amphetamine      1000 Barbiturate      200 Benzodiazepine   213 Tricyclics       086 Opiates          300 Cocaine          300 THC              50   Surgical pcr screen     Status: None   Collection Time: 06/02/15  4:35 AM  Result Value Ref Range   MRSA, PCR NEGATIVE NEGATIVE   Staphylococcus aureus NEGATIVE NEGATIVE    Comment:        The Xpert SA Assay (FDA approved for NASAL specimens in patients over 62 years of age), is one component of a comprehensive surveillance program.  Test performance has been validated by [facility] for patients greater than or equal to 100 year old. It is not intended to diagnose infection nor to guide or monitor treatment.   Protime-INR     Status: Abnormal   Collection Time: 06/02/15  5:40 AM  Result Value Ref Range   Prothrombin Time 15.9 (H) 11.6 - 15.2 seconds   INR 1.26 0.00 - 1.49  APTT     Status: Abnormal   Collection Time: 06/02/15  5:40 AM  Result Value Ref Range   aPTT 45 (H) 24 - 37 seconds    Comment:        IF BASELINE aPTT IS ELEVATED, SUGGEST PATIENT RISK ASSESSMENT BE USED TO DETERMINE APPROPRIATE ANTICOAGULANT THERAPY.   Comprehensive metabolic panel     Status: Abnormal   Collection Time: 06/02/15  5:40 AM  Result Value Ref Range   Sodium 137 135 - 145 mmol/L   Potassium 3.3 (L) 3.5 - 5.1 mmol/L   Chloride 107 101 - 111 mmol/L  CO2 20 (L) 22 - 32  mmol/L   Glucose, Bld 113 (H) 65 - 99 mg/dL   BUN 25 (H) 6 - 20 mg/dL   Creatinine, Ser 1.45 (H) 0.44 - 1.00 mg/dL   Calcium 7.5 (L) 8.9 - 10.3 mg/dL   Total Protein 5.3 (L) 6.5 - 8.1 g/dL   Albumin 1.6 (L) 3.5 - 5.0 g/dL   AST 14 (L) 15 - 41 U/L   ALT 17 14 - 54 U/L   Alkaline Phosphatase 103 38 - 126 U/L   Total Bilirubin 0.5 0.3 - 1.2 mg/dL   GFR calc non Af Amer 36 (L) >60 mL/min   GFR calc Af Amer 42 (L) >60 mL/min    Comment: (NOTE) The eGFR has been calculated using the CKD EPI equation. This calculation has not been validated in all clinical situations. eGFR's persistently <60 mL/min signify possible Chronic Kidney Disease.    Anion gap 10 5 - 15  CBC     Status: Abnormal   Collection Time: 06/02/15  5:40 AM  Result Value Ref Range   WBC 14.2 (H) 4.0 - 10.5 K/uL   RBC 3.33 (L) 3.87 - 5.11 MIL/uL   Hemoglobin 8.1 (L) 12.0 - 15.0 g/dL   HCT 25.0 (L) 36.0 - 46.0 %   MCV 75.1 (L) 78.0 - 100.0 fL   MCH 24.3 (L) 26.0 - 34.0 pg   MCHC 32.4 30.0 - 36.0 g/dL   RDW 14.1 11.5 - 15.5 %   Platelets 512 (H) 150 - 400 K/uL  Glucose, capillary     Status: Abnormal   Collection Time: 06/02/15  7:39 AM  Result Value Ref Range   Glucose-Capillary 133 (H) 65 - 99 mg/dL   Comment 1 Capillary Specimen     Ct Chest W Contrast  06/02/2015  CLINICAL DATA:  Evaluate for left breast mass versus malignancy. EXAM: CT CHEST WITH CONTRAST TECHNIQUE: Multidetector CT imaging of the chest was performed during intravenous contrast administration. CONTRAST:  73m OMNIPAQUE IOHEXOL 300 MG/ML  SOLN COMPARISON:  None. FINDINGS: THORACIC INLET/BODY WALL: The entire left breast is affected by a broad-based ulcerated mass with peripheral enhancement, heaped up margins, and nonenhancing debris in the ulcer base. Maximal diameter is approximately 13 cm. No drainable fluid collection. This is has the appearance of a necrotic malignancy. There is likely involvement of the pectoralis muscles but no bony invasion  noted. Left axillary nodes are within normal limits in size, but asymmetric and suspicious soley based on the size of breast mass. No right breast lesion is seen. MEDIASTINUM: No cardiomegaly or pericardial effusion. Atherosclerosis, including the coronary arteries. There is a hypodense filling defect attached to the left wall of the ascending aorta which measures up to 3 cm. On reformats, this appears to have a narrow base along the wall which is not otherwise remarkable for thickening or calcification. Atherosclerosis is present along the aorta and great vessels, without dissection or other aerated notable mural thrombus. There is no indication of valvular disease/vegetation. No lymphadenopathy, including along the left IMA distribution LUNG WINDOWS: Mild centrilobular emphysema. There is no edema, consolidation, effusion, or pneumothorax. No suspicious nodule. UPPER ABDOMEN: There is a 10 mm low-density in the upper left liver which does not measured cystic density. Indeterminate 13 mm right adrenal mass. OSSEOUS: No acute fracture.  No suspicious lytic or blastic lesions. IMPRESSION: 1. 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 As questioned clinically, no abscess.Small but asymmetric left axillary lymph nodes. 2. 3 cm  vegetation attached to the ascending aortic wall. This would be unusual for bland atherosclerotic thrombus, correlate for systemic infection/bacteremia. 3. Indeterminate liver and right adrenal masses. Recommend abdominal staging after workup of #1. Electronically Signed   By: Monte Fantasia M.D.   On: 06/02/2015 03:24    Review of Systems  Constitutional: Positive for fever, chills, weight loss, malaise/fatigue and diaphoresis.  Respiratory: Positive for shortness of breath. Negative for cough, hemoptysis, sputum production and wheezing.   Cardiovascular: Negative for chest pain, palpitations, orthopnea, claudication, leg swelling and PND.  Gastrointestinal: Positive for  constipation. Negative for heartburn, nausea, vomiting, abdominal pain, diarrhea, blood in stool and melena.  Genitourinary: Negative for dysuria, urgency, frequency, hematuria and flank pain.  Neurological: Negative for dizziness, tingling, tremors, sensory change, speech change, seizures, loss of consciousness, weakness and headaches.  Psychiatric/Behavioral: Negative for suicidal ideas and substance abuse.   Blood pressure 112/66, pulse 80, temperature 98.8 F (37.1 C), temperature source Oral, resp. rate 20, height 5' 8"  (1.727 m), weight 63.4 kg (139 lb 12.4 oz), SpO2 100 %. Physical Exam  Constitutional: She is oriented to person, place, and time. She appears well-developed and well-nourished. No distress.  Cardiovascular: Normal rate, regular rhythm, normal heart sounds and intact distal pulses.  Exam reveals no gallop and no friction rub.   No murmur heard. Respiratory: Effort normal and breath sounds normal. No respiratory distress. She has no wheezes. She has no rales. She exhibits no tenderness.  GI: Soft. Bowel sounds are normal. She exhibits no distension and no mass. There is no tenderness. There is no rebound and no guarding.  Musculoskeletal: Normal range of motion. She exhibits no edema or tenderness.  Neurological: She is alert and oriented to person, place, and time.  Skin: She is not diaphoretic.  Foul smelling left necrotic mass.  See image   Psychiatric: She has a normal mood and affect. Her behavior is normal. Judgment and thought content normal.  The mass is moderately mobile, so it may not be entirely fixed to the chest wall.     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  May need eventual skin graft -oncology consult  -BID wet to dry dressing changes  ID-Unasyn and zyvox Possible  vegetation in ascending aorta-per primary team.  ID consulted, probably needs cardiology evaluation   AKI-per primary team   The surgical procedure has been discussed with the patient.  Potential risks, benefits, alternative treatments, and expected outcomes have been explained.  All of the patient's questions at this time have been answered.  The likelihood of reaching the patient's treatment goal is good.  The patient understand the proposed surgical procedure and wishes to proceed.   Erby Pian ANP-BC Pager [phone] 06/02/2015, 9:35 AM   Imogene Burn. Georgette Dover, MD, [facility]  General/ Trauma Surgery  06/02/2015 10:46 AM

## 2015-06-02 NOTE — ED Notes (Signed)
Pt presents to ER via [facility] from car in [facility]; pt states she is homeless and lives in her car; pt reports "spider bite" to L breast for unknown amount of time; purulent drainage and foul odor noted; pt also reports dehydration and hunger x 3 days

## 2015-06-02 NOTE — Progress Notes (Signed)
Initial Nutrition Assessment  DOCUMENTATION CODES:   Not applicable  INTERVENTION:   Supplement diet once advanced.   NUTRITION DIAGNOSIS:   Increased nutrient needs related to wound healing as evidenced by estimated needs.  GOAL:   Patient will meet greater than or equal to 90% of their needs  MONITOR:   Diet advancement, Supplement acceptance, PO intake, Skin, I & O's  REASON FOR ASSESSMENT:   Malnutrition Screening Tool   ASSESSMENT:   Pt who is homeless admitted with left breast pain which started as a boil about 5 months ago. 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   Per surgery plan for breast removal and wound vac.  Pt currently in OR. Unable to complete Nutrition-Focused physical exam at this time.  Per MD pt has lost 20 lb of unintentional weight loss in the last 5 months with chills and sweats.  Pt at high risk for malnutrition but unable to determine at this time.   Labs reviewed: potassium low 3.3   Diet Order:  Diet NPO time specified Except for: Sips with Meds  Skin:  Wound (see comment) (Necrotic left breast)  Last BM:  unknown  Height:   Ht Readings from Last 1 Encounters:  06/02/15 5\' 8"  (1.727 m)   Weight:   Wt Readings from Last 1 Encounters:  06/02/15 139 lb 12.4 oz (63.4 kg)    Ideal Body Weight:  63.6 kg  BMI:  Body mass index is 21.26 kg/(m^2).  Estimated Nutritional Needs:   Kcal:  1800-2000  Protein:  95-115 grams  Fluid:  > 1.8 L/day  EDUCATION NEEDS:   No education needs identified at this time  [place], [place], [facility] Pager [phone] After Hours Pager

## 2015-06-02 NOTE — ED Provider Notes (Signed)
CSN: [id number]     Arrival date & time 06/02/15  [id number] History  By signing my name below, I, Meriel Pica, attest that this documentation has been prepared under the direction and in the presence of Everlene Balls, MD. Electronically Signed: Meriel Pica, ED Scribe. 06/02/2015. 1:18 AM.   Chief Complaint  Patient presents with  . Breast Problem    The history is provided by the EMS personnel. The history is limited by the condition of the patient. No language interpreter was used.   LEVEL 5 CAVEAT: ALTERED MENTAL STATUS   HPI Comments: Robin Cantrell is a 70 y.o. female, with no known history, brought in by ambulance, who presents to the Emergency Department for evaluation of a possible spider bite sustained to left upper chest with timeframe unknown. Tthere is a bandage placed over the area that is saturated with purulent drainage. No other details are known.   History reviewed. No pertinent past medical history. Past Surgical History  Procedure Laterality Date  . Ankle fracture surgery     History reviewed. No pertinent family history. Social History  Substance Use Topics  . Smoking status: Former Research scientist (life sciences)  . Smokeless tobacco: None  . Alcohol Use: No   OB History    No data available     Review of Systems  Unable to perform ROS: Mental status change    Allergies  Review of patient's allergies indicates no known allergies.  Home Medications   Prior to Admission medications   Not on File   BP 141/84 mmHg  Pulse 69  Temp(Src) 98.2 F (36.8 C) (Oral)  Resp 18  SpO2 100% Physical Exam  Constitutional: She is oriented to person, place, and time. Vital signs are normal. She appears well-developed and well-nourished.  Non-toxic appearance. She does not appear ill. No distress.  Disheveled appearance  HENT:  Head: Normocephalic and atraumatic.  Nose: Nose normal.  Mouth/Throat: Oropharynx is clear and moist. No oropharyngeal exudate.  Eyes: Conjunctivae and EOM  are normal. Pupils are equal, round, and reactive to light. No scleral icterus.  Neck: Normal range of motion. Neck supple. No tracheal deviation, no edema, no erythema and normal range of motion present. No thyroid mass and no thyromegaly present.  Cardiovascular: Normal rate, regular rhythm, S1 normal, S2 normal, normal heart sounds, intact distal pulses and normal pulses.  Exam reveals no gallop and no friction rub.   No murmur heard. Pulmonary/Chest: Effort normal and breath sounds normal. No respiratory distress. She has no wheezes. She has no rhonchi. She has no rales. She exhibits tenderness.  Abdominal: Soft. Normal appearance and bowel sounds are normal. He exhibits no distension, no ascites and no mass. There is no hepatosplenomegaly. There is no tenderness. There is no rebound, no guarding and no CVA tenderness.  Musculoskeletal: Normal range of motion. He exhibits no edema or tenderness.  Lymphadenopathy:    He has no cervical adenopathy.  Neurological: He is alert and oriented to person, place, and time. He has normal strength. No cranial nerve deficit or sensory deficit.  Skin: Skin is warm, dry and intact. No petechiae and no rash noted. She is not diaphoretic. No erythema. No pallor.  Large necrotic, purulent wound encompassing the entire left breast. It is malodorous and draining pus. There is tenderness to palpation.  Nursing note and vitals reviewed.   ED Course  Procedures  DIAGNOSTIC STUDIES: Oxygen Saturation is 100% on RA, normal by my interpretation.    COORDINATION OF CARE: 12:59 AM Discussed  treatment plan with pt. Pt acknowledges and agrees to plan.   Labs Review Labs Reviewed  CBC WITH DIFFERENTIAL/PLATELET - Abnormal; Notable for the following:    WBC 17.9 (*)    RBC 3.84 (*)    Hemoglobin 9.3 (*)    HCT 28.8 (*)    MCV 75.0 (*)    MCH 24.2 (*)    Platelets 553 (*)    Neutro Abs 15.9 (*)    All other components within normal limits  BASIC METABOLIC  PANEL - Abnormal; Notable for the following:    CO2 19 (*)    BUN 29 (*)    Creatinine, Ser 1.41 (*)    Calcium 8.6 (*)    GFR calc non Af Amer 38 (*)    GFR calc Af Amer 44 (*)    All other components within normal limits  CULTURE, BLOOD (ROUTINE X 2)  CULTURE, BLOOD (ROUTINE X 2)  ETHANOL  URINE RAPID DRUG SCREEN, HOSP PERFORMED  I-STAT CG4 LACTIC ACID, ED    Imaging Review Ct Chest W Contrast  06/02/2015  CLINICAL DATA:  Evaluate for left breast mass versus malignancy. EXAM: CT CHEST WITH CONTRAST TECHNIQUE: Multidetector CT imaging of the chest was performed during intravenous contrast administration. CONTRAST:  63mL OMNIPAQUE IOHEXOL 300 MG/ML  SOLN COMPARISON:  None. FINDINGS: THORACIC INLET/BODY WALL: The entire left breast is affected by a broad-based ulcerated mass with peripheral enhancement, heaped up margins, and nonenhancing debris in the ulcer base. Maximal diameter is approximately 13 cm. No drainable fluid collection. This is has the appearance of a necrotic malignancy. There is likely involvement of the pectoralis muscles but no bony invasion noted. Left axillary nodes are within normal limits in size, but asymmetric and suspicious soley based on the size of breast mass. No right breast lesion is seen. MEDIASTINUM: No cardiomegaly or pericardial effusion. Atherosclerosis, including the coronary arteries. There is a hypodense filling defect attached to the left wall of the ascending aorta which measures up to 3 cm. On reformats, this appears to have a narrow base along the wall which is not otherwise remarkable for thickening or calcification. Atherosclerosis is present along the aorta and great vessels, without dissection or other aerated notable mural thrombus. There is no indication of valvular disease/vegetation. No lymphadenopathy, including along the left IMA distribution LUNG WINDOWS: Mild centrilobular emphysema. There is no edema, consolidation, effusion, or pneumothorax. No  suspicious nodule. UPPER ABDOMEN: There is a 10 mm low-density in the upper left liver which does not measured cystic density. Indeterminate 13 mm right adrenal mass. OSSEOUS: No acute fracture.  No suspicious lytic or blastic lesions. IMPRESSION: 1. 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 As questioned clinically, no abscess.Small but asymmetric left axillary lymph nodes. 2. 3 cm vegetation attached to the ascending aortic wall. This would be unusual for bland atherosclerotic thrombus, correlate for systemic infection/bacteremia. 3. Indeterminate liver and right adrenal masses. Recommend abdominal staging after workup of #1. Electronically Signed   By: Monte Fantasia M.D.   On: 06/02/2015 03:24   I have personally reviewed and evaluated these images and lab results as part of my medical decision-making.   MDM   Final diagnoses:  None    Patient presents to the emergency department for weakness and hunger. She is homeless. Physical exam reveals a large necrotic mass of the left breast. I concern for possible malignancy versus abscess. Will obtain CT scan of the chest for evaluation. Laboratory studies show white blood cell count  of 17.9. Patient was given vancomycin and Zosyn for treatment of possible infection.  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  Patient will require admission for further work up.  [facility] hospitalist have ben paged. Dr. Porfirio Mylar accepts the patient to step down  I personally performed the services described in this documentation, which was scribed in my presence. The recorded information has been reviewed and is accurate.      Everlene Balls, MD 06/02/15 [phone]

## 2015-06-02 NOTE — Transfer of Care (Signed)
Immediate Anesthesia Transfer of Care Note  Patient: Robin Cantrell  Procedure(s) Performed: Procedure(s): LEFT TOTAL MASTECTOMY (Left)  Patient Location: PACU  Anesthesia Type:General  Level of Consciousness: awake, sedated and patient cooperative  Airway & Oxygen Therapy: Patient Spontanous Breathing and Patient connected to face mask oxygen  Post-op Assessment: Report given to RN, Post -op Vital signs reviewed and stable and Patient moving all extremities  Post vital signs: Reviewed and stable  Last Vitals:  Filed Vitals:   06/02/15 0900 06/02/15 1100  BP: 122/68   Pulse: 72 77  Temp:    Resp: 15 19    Complications: No apparent anesthesia complications

## 2015-06-02 NOTE — Progress Notes (Signed)
ANTIBIOTIC CONSULT NOTE - INITIAL  Pharmacy Consult for Vancomycin  Indication: Endovascular infection (Vegetation on aortic wall)  No Known Allergies  Patient Measurements: Height: 5\' 8"  (172.7 cm) Weight: 139 lb 12.4 oz (63.4 kg) IBW/kg (Calculated) : 63.9  Vital Signs: Temp: 97.4 F (36.3 C) (02/03 2018) Temp Source: Oral (02/03 2018) BP: 114/70 mmHg (02/03 1800) Pulse Rate: 46 (02/03 1800)  Labs:  Recent Labs  06/02/15 0120 06/02/15 0540  WBC 17.9* 14.2*  HGB 9.3* 8.1*  PLT 553* 512*  CREATININE 1.41* 1.45*   Estimated Creatinine Clearance: 37.7 mL/min (by C-G formula based on Cr of 1.45). No results for input(s): VANCOTROUGH, VANCOPEAK, VANCORANDOM, GENTTROUGH, GENTPEAK, GENTRANDOM, TOBRATROUGH, TOBRAPEAK, TOBRARND, AMIKACINPEAK, AMIKACINTROU, AMIKACIN in the last 72 hours.   Microbiology: Recent Results (from the past 720 hour(s))  Surgical pcr screen     Status: None   Collection Time: 06/02/15  4:35 AM  Result Value Ref Range Status   MRSA, PCR NEGATIVE NEGATIVE Final   Staphylococcus aureus NEGATIVE NEGATIVE Final    Comment:        The Xpert SA Assay (FDA approved for NASAL specimens in patients over 10 years of age), is one component of a comprehensive surveillance program.  Test performance has been validated by [facility] for patients greater than or equal to 45 year old. It is not intended to diagnose infection nor to guide or monitor treatment.     Medical History: History reviewed. No pertinent past medical history.   Assessment: 70 y/o F on Zyvox/Unasysn, switching Zvyox to Vancomycin, WBC elevated, noted renal dysfunction  Goal of Therapy:  Vancomycin trough level 15-20 mcg/ml  Plan:  -Vancomycin 1000 mg IV q24h (received 1500 mg IV in the ED on 2/3 at ~0300) -Continue Unasyn  -Drug levels as indicated  -F/U infectious work-up  Narda Bonds 06/02/2015,11:43 PM

## 2015-06-02 NOTE — Op Note (Signed)
Pre-op Diagnosis:  Necrotic left breast mass, probable cancer Post-op Diagnosis:  Same Procedure performed: Left mastectomy with placement of wound VAC (60 cm2) Surgeon:  Maia Petties. Anesthesia Gen. endotracheal Indications: This is a 70 year old female who has not sought any medical attention over the last couple of years. She presented to the emergency department with a large necrotic mass in her left breast. This involved almost her entire breast. CT scan showed that this extended down to the chest wall but it is unclear whether this involves the pectoralis muscle. We were asked to see the patient. She has elevated white blood cell count and some electrode abnormalities. We recommended a palliative mastectomy to try to remove the necrotic breast tissue off the chest wall. She knows that this is not a curative procedure. She also understands that we will not be up to close her mastectomy site and that she may need a skin graft in the near future.  Description of procedure:  The patient is brought to the operating room and placed in a supine position on the operating room table. After an adequate level general anesthesia was obtained her left breast was prepped with Betadine and draped in sterile fashion.   We made a large elliptical incision around the entire breast. This goes up to the infraclavicular space and begins medially at the edge of the sternum. Inferiorly this goes almost to the inframammary crease. Laterally this goes almost to the axilla. We made our skin incisions. There are significant large subcutaneous vessels because of the hypervascularity of the tumor. These were controlled with cautery as well as clips. We dissected under the skin flaps to the fascia of the pectoralis muscle superiorly below the clavicle. Medially we dissected down to the sternum. Inferiorly we dissected down to the inframammary crease. With continued our dissection laterally to expose the edge of the pectoralis  muscle. We dissected into the axilla. Several enlarged lymph nodes are palpated. These were taken with the specimen. We continued dissecting until we were at the lateral chest wall just anterior to the latissimus muscle. We then dissected the breast off of the chest wall from medial to lateral. It does not appear that the gross tumor invades the pectoralis. We'll remove the anterior pectoralis fascial specimen. The entire specimen was oriented with a suture lateral. This was sent for pathologic examination. We then irrigated the wound thoroughly and cautery issues for hemostasis. We tacked down the skin flaps to the pectoralis muscle with multiple interrupted 3-0 Vicryl sutures. We tacked down the skin flaps laterally to close the axilla. This left a open wound measuring about 6 x 10 cm. We cut a large VAC sponge to fit the wound. This was placed into the wound and secured with an occlusive drape. This was placed to suction with good seal. The patient was then extubated and brought to the recovery room in stable condition. All sponge, initially, and needle counts are correct.  Imogene Burn. Georgette Dover, MD, [facility]  General/ Trauma Surgery  06/02/2015 1:31 PM

## 2015-06-02 NOTE — Anesthesia Postprocedure Evaluation (Signed)
Anesthesia Post Note  Patient: Robin Cantrell  Procedure(s) Performed: Procedure(s) (LRB): LEFT TOTAL MASTECTOMY (Left)  Patient location during evaluation: PACU Anesthesia Type: General Level of consciousness: awake and alert Pain management: pain level controlled Vital Signs Assessment: post-procedure vital signs reviewed and stable Respiratory status: spontaneous breathing, nonlabored ventilation, respiratory function stable and patient connected to nasal cannula oxygen Cardiovascular status: blood pressure returned to baseline and stable Postop Assessment: no signs of nausea or vomiting Anesthetic complications: no    Last Vitals:  Filed Vitals:   06/02/15 1415 06/02/15 1430  BP:    Pulse: 53 49  Temp:    Resp: 17 18    Last Pain: There were no vitals filed for this visit.               Yao Hyppolite S

## 2015-06-02 NOTE — H&P (Signed)
[facility] History and Physical  Jerald Kimler [id number] DOB: Mar 29, 1946 DOA: 06/02/2015  Referring physician: ED physician PCP: No primary care provider on file.  Specialists:   Chief Complaint: left breast pain  HPI: Robin Cantrell is a 70 y.o. female without significant past medical history except for homeless, who presents with left breast pain.  Patient reports that she noticed left breast lesion for more than 5 month, did not seek for treatment. She states that she may have had a spider bite to left upper chest with timeframe unknown. She has pain over left breast with purulent drainage. She has extensive necrotic lesion over left breast. Patient does not have fever, chills, nausea, vomiting, abdominal pain or symptoms of UTI. She has mild shortness of breath since January, but no chest pain or coughing. No unilateral weakness.   In ED, patient was found to have WBC 17.9, temperature normal, RR 23, no tachycardia, acute renal injury with creatinine 1.41, BUN 29, lactate 1.43, alcohol level less than 5. CT-chest with contrast showed necrotic left breast mass consistent with malignancy, small but asymmetric left axillary lymph nodes, 3 cm vegetation attached to the ascending aortic wall, indeterminate liver and right adrenal masses. Patient is admitted to inpatient for further eval and treatment. ID, Dr. Tommy Medal was consulted.  EKG: Not done in ED, will get one.   Where does patient live?   Homeless, lives in her car Can patient participate in ADLs? Some   Review of Systems:   General: no fevers, chills, has poor appetite, has fatigue HEENT: no blurry vision, hearing changes or sore throat Pulm: has dyspnea, no coughing, wheezing CV: no chest pain, palpitations Abd: no nausea, vomiting, abdominal pain, diarrhea, constipation GU: no dysuria, burning on urination, increased urinary frequency, hematuria  Ext: no leg edema Neuro: no unilateral weakness, numbness, or  tingling, no vision change or hearing loss Skin: no rash. Has necrotic left breast lesion with purulent drainage MSK: No muscle spasm, no deformity, no limitation of range of movement in spin Heme: No easy bruising.  Travel history: No recent long distant travel.  Allergy: No Known Allergies  Past Medical History  Diagnosis Date  . Breast cancer ([place]) 06/02/2015    Past Surgical History  Procedure Laterality Date  . Ankle fracture surgery      Social History:  reports that she has quit smoking. She does not have any smokeless tobacco history on file. She reports that she does not drink alcohol or use illicit drugs.  Family History: reviewed with patient, but patient does not know any family medical history.  Prior to Admission medications   Not on File    Physical Exam: Filed Vitals:   06/02/15 0115 06/02/15 0330 06/02/15 0400 06/02/15 0445  BP: 140/88 141/78 109/80 150/87  Pulse: 71 74  85  Temp:    98.5 F (36.9 C)  TempSrc:    Oral  Resp: 15 23 18 17   Height:    5\' 8"  (1.727 m)  Weight:    63.4 kg (139 lb 12.4 oz)  SpO2: 100% 100% 99% 100%   General: Not in acute distress HEENT:       Eyes: PERRL, EOMI, no scleral icterus.       ENT: No discharge from the ears and nose, no pharynx injection, no tonsillar enlargement.        Neck: No JVD, no bruit, no mass felt. Heme: No neck lymph node enlargement. Cardiac: S1/S2, RRR, No murmurs, No gallops or rubs.  Pulm:  No rales, wheezing, rhonchi or rubs. Abd: Soft, nondistended, nontender, no rebound pain, no organomegaly, BS present. Ext: No pitting leg edema bilaterally. 2+DP/PT pulse bilaterally. Musculoskeletal: No joint deformities, No joint redness or warmth, no limitation of ROM in spin. Skin: there is a large necrotic, purulent wound encompassing the entire left breast. It is malodorous and draining pus. There is tenderness to palpation.  Neuro: Alert, oriented X3, cranial nerves II-XII grossly intact, moves all  extremities. Psych: Patient is not psychotic, no suicidal or hemocidal ideation.  Labs on Admission:  Basic Metabolic Panel:  Recent Labs Lab 06/02/15 0120  NA 136  K 4.6  CL 103  CO2 19*  GLUCOSE 87  BUN 29*  CREATININE 1.41*  CALCIUM 8.6*   Liver Function Tests: No results for input(s): AST, ALT, ALKPHOS, BILITOT, PROT, ALBUMIN in the last 168 hours. No results for input(s): LIPASE, AMYLASE in the last 168 hours. No results for input(s): AMMONIA in the last 168 hours. CBC:  Recent Labs Lab 06/02/15 0120  WBC 17.9*  NEUTROABS 15.9*  HGB 9.3*  HCT 28.8*  MCV 75.0*  PLT 553*   Cardiac Enzymes: No results for input(s): CKTOTAL, CKMB, CKMBINDEX, TROPONINI in the last 168 hours.  BNP (last 3 results) No results for input(s): BNP in the last 8760 hours.  ProBNP (last 3 results) No results for input(s): PROBNP in the last 8760 hours.  CBG: No results for input(s): GLUCAP in the last 168 hours.  Radiological Exams on Admission: Ct Chest W Contrast  06/02/2015  CLINICAL DATA:  Evaluate for left breast mass versus malignancy. EXAM: CT CHEST WITH CONTRAST TECHNIQUE: Multidetector CT imaging of the chest was performed during intravenous contrast administration. CONTRAST:  38mL OMNIPAQUE IOHEXOL 300 MG/ML  SOLN COMPARISON:  None. FINDINGS: THORACIC INLET/BODY WALL: The entire left breast is affected by a broad-based ulcerated mass with peripheral enhancement, heaped up margins, and nonenhancing debris in the ulcer base. Maximal diameter is approximately 13 cm. No drainable fluid collection. This is has the appearance of a necrotic malignancy. There is likely involvement of the pectoralis muscles but no bony invasion noted. Left axillary nodes are within normal limits in size, but asymmetric and suspicious soley based on the size of breast mass. No right breast lesion is seen. MEDIASTINUM: No cardiomegaly or pericardial effusion. Atherosclerosis, including the coronary arteries.  There is a hypodense filling defect attached to the left wall of the ascending aorta which measures up to 3 cm. On reformats, this appears to have a narrow base along the wall which is not otherwise remarkable for thickening or calcification. Atherosclerosis is present along the aorta and great vessels, without dissection or other aerated notable mural thrombus. There is no indication of valvular disease/vegetation. No lymphadenopathy, including along the left IMA distribution LUNG WINDOWS: Mild centrilobular emphysema. There is no edema, consolidation, effusion, or pneumothorax. No suspicious nodule. UPPER ABDOMEN: There is a 10 mm low-density in the upper left liver which does not measured cystic density. Indeterminate 13 mm right adrenal mass. OSSEOUS: No acute fracture.  No suspicious lytic or blastic lesions. IMPRESSION: 1. 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 As questioned clinically, no abscess.Small but asymmetric left axillary lymph nodes. 2. 3 cm vegetation attached to the ascending aortic wall. This would be unusual for bland atherosclerotic thrombus, correlate for systemic infection/bacteremia. 3. Indeterminate liver and right adrenal masses. Recommend abdominal staging after workup of #1. Electronically Signed   By: Monte Fantasia M.D.   On: 06/02/2015  03:24    Assessment/Plan Principal Problem:   Infection of left breast Active Problems:   Left breast mass   Breast cancer (HCC)   AKI (acute kidney injury) ([place])   Breast infection   Homeless   Infection of left breast, possible left breast cancer and vegetation in ascending aorta: pt likely has breast cancer as evidenced by CT-chest with contrast. Now completed with infection. I spoke with ID, Dr. Drucilla Schmidt, who recommended to start Linezolide and Unasyn (patient received 1 dose of vancomycin and Zosyn in emergency room). Pt is hemodynamically stable currently and lactate is normal, but she is at high risk to develop septic  emboli due to possible infectious vegetation in the ascending aorta. Pt needs high level of care. Due to large necrotic lesion over left breast, it seems to be difficult to do 2d echo.  -will admit to SDU -Appreciate Dr. Derek Mound recommendation. -start Linezolide (one dose with IV and then oral) and IV Unasyn per Dr. Tommy Medal -IVF: 2L of NS bolus, then 100 cc/h -When necessary Zofran for nausea, and prn Norco morphine for pain -f/u blood culture -consult to SW for her homeless status -check UDS and HIV ab -INR/PTT/type & screen -May need to oncology and surgeon (pt may need breast reconstruction)   AKI: Likely due to prerenal secondary to dehydration - IVF as above - Check FeNa  - US-renal - Follow up renal function by BMP - Avoid NSAIDs   DVT ppx: SCD  Code Status: Full code Family Communication: None at bed side.  Disposition Plan: Admit to inpatient   Date of Service 06/02/2015    Ivor Costa [facility] Pager [phone]  If 7PM-7AM, please contact night-coverage [email or web address] Password [email or web address] 06/02/2015, 5:03 AM

## 2015-06-02 NOTE — Consult Note (Signed)
Patient ID: Robin Cantrell MRN: [id number], DOB/AGE: 68-22-1947   Admit date: 06/02/2015   Primary Physician: No primary care provider on file. Primary Cardiologist: New (Dr. Oval Linsey)  Pt. Profile:  70 y/o homeless female with no significant past medical history and does not seek routine care, who presented with a chronic left necrotic breast lesion, c/w malignancy on chest CT. She is s/p left total mastectomy. CT also concerning for a 3 cm vegetation attached to the ascending aortic wall.  Problem List  History reviewed. No pertinent past medical history.  Past Surgical History  Procedure Laterality Date  . Ankle fracture surgery       Allergies  No Known Allergies  HPI  70 y/o homeless female, who presented to [facility] on 06/02/15 with a complaint of left breast pain. She reports development of a breast lesion that she first noticed 5 months ago that had progressively worsened with increased pain. On arrival to [facility], she was found to have extensive necrotic tissue over the left breast.   In ED, patient was found to have WBC 17.9, temperature normal, RR 23, no tachycardia, acute renal injury with creatinine 1.41, BUN 29, lactate 1.43. CT of chest with contrast showed necrotic left breast mass consistent with malignancy. She was also noted to have a 3 cm vegetation attached to the ascending aortic wall, for which cardiology has been consulted. She has just returned from the PACU after undergoing left total mastectomy. She remains drowsy from her sedation, thus unable to obtain a good solid history from the patient at this time regarding cardiac symptoms.   Home Medications  Prior to Admission medications   Not on File    Family History  History reviewed. No pertinent family history.  Social History  Social History   Social History  . Marital Status: Single    Spouse Name: N/A  . Number of Children: N/A  . Years of Education: N/A   Occupational History  . Not on file.    Social History Main Topics  . Smoking status: Former Research scientist (life sciences)  . Smokeless tobacco: Not on file  . Alcohol Use: No  . Drug Use: No  . Sexual Activity: Not on file   Other Topics Concern  . Not on file   Social History Narrative  . No narrative on file     Review of Systems General:  No chills, fever, night sweats or weight changes.  Cardiovascular:  No chest pain, dyspnea on exertion, edema, orthopnea, palpitations, paroxysmal nocturnal dyspnea. Dermatological: No rash, lesions/masses Respiratory: No cough, dyspnea Urologic: No hematuria, dysuria Abdominal:   No nausea, vomiting, diarrhea, bright red blood per rectum, melena, or hematemesis Neurologic:  No visual changes, wkns, changes in mental status. All other systems reviewed and are otherwise negative except as noted above.  Physical Exam  Blood pressure 125/67, pulse 48, temperature 98.1 F (36.7 C), temperature source Oral, resp. rate 17, height 5\' 8"  (1.727 m), weight 139 lb 12.4 oz (63.4 kg), SpO2 100 %.  General: Pleasant, NAD Psych: Normal affect. Neuro: Alert and oriented X 3. Moves all extremities spontaneously. HEENT: Normal  Neck: Supple without bruits or JVD. Chest Wall: s/p left total mastectomy  Lungs:  Resp regular and unlabored, CTA. Heart: RRR no s3, s4, or murmurs. Abdomen: Soft, non-tender, non-distended, BS + x 4.  Extremities: No clubbing, cyanosis or edema. DP/PT/Radials 2+ and equal bilaterally.  Labs  Troponin (Point of Care Test) No results for input(s): TROPIPOC in the last 72 hours.  No results for input(s): CKTOTAL, CKMB, TROPONINI in the last 72 hours. Lab Results  Component Value Date   WBC 14.2* 06/02/2015   HGB 8.1* 06/02/2015   HCT 25.0* 06/02/2015   MCV 75.1* 06/02/2015   PLT 512* 06/02/2015    Recent Labs Lab 06/02/15 0540  NA 137  K 3.3*  CL 107  CO2 20*  BUN 25*  CREATININE 1.45*  CALCIUM 7.5*  PROT 5.3*  BILITOT 0.5  ALKPHOS 103  ALT 17  AST 14*  GLUCOSE  113*   No results found for: CHOL, HDL, LDLCALC, TRIG No results found for: DDIMER   Radiology/Studies  Ct Chest W Contrast  06/02/2015  CLINICAL DATA:  Evaluate for left breast mass versus malignancy. EXAM: CT CHEST WITH CONTRAST TECHNIQUE: Multidetector CT imaging of the chest was performed during intravenous contrast administration. CONTRAST:  23mL OMNIPAQUE IOHEXOL 300 MG/ML  SOLN COMPARISON:  None. FINDINGS: THORACIC INLET/BODY WALL: The entire left breast is affected by a broad-based ulcerated mass with peripheral enhancement, heaped up margins, and nonenhancing debris in the ulcer base. Maximal diameter is approximately 13 cm. No drainable fluid collection. This is has the appearance of a necrotic malignancy. There is likely involvement of the pectoralis muscles but no bony invasion noted. Left axillary nodes are within normal limits in size, but asymmetric and suspicious soley based on the size of breast mass. No right breast lesion is seen. MEDIASTINUM: No cardiomegaly or pericardial effusion. Atherosclerosis, including the coronary arteries. There is a hypodense filling defect attached to the left wall of the ascending aorta which measures up to 3 cm. On reformats, this appears to have a narrow base along the wall which is not otherwise remarkable for thickening or calcification. Atherosclerosis is present along the aorta and great vessels, without dissection or other aerated notable mural thrombus. There is no indication of valvular disease/vegetation. No lymphadenopathy, including along the left IMA distribution LUNG WINDOWS: Mild centrilobular emphysema. There is no edema, consolidation, effusion, or pneumothorax. No suspicious nodule. UPPER ABDOMEN: There is a 10 mm low-density in the upper left liver which does not measured cystic density. Indeterminate 13 mm right adrenal mass. OSSEOUS: No acute fracture.  No suspicious lytic or blastic lesions. IMPRESSION: 1. Necrotic left breast mass  consistent with malignancy. As questioned clinically, no abscess.Small but asymmetric left axillary lymph nodes. 2. 3 cm vegetation attached to the ascending aortic wall. This would be unusual for bland atherosclerotic thrombus, correlate for systemic infection/bacteremia. 3. Indeterminate liver and right adrenal masses. Recommend abdominal staging after workup of #1. Electronically Signed   By: Monte Fantasia M.D.   On: 06/02/2015 03:24    ECG  NSR, poor R wave progression    ASSESSMENT AND PLAN  Principal Problem:   Infection of left breast Active Problems:   Left breast mass   Breast cancer (HCC)   AKI (acute kidney injury) ([place])   Breast infection   Homeless    1. Aortic Mass: found incidentally on CT of chest during w/u for necrotic left breast lesion. She has just returned from the PACU after undergoing left total mastectomy. She remains drowsy from her sedation, thus unable to obtain a good solid history from the patient at this time regarding cardiac symptoms.  MD to follow. Plan for TEE to further evaluate once more stable.    Signed, Lyda Jester, PA-C 06/02/2015, 3:16 PM

## 2015-06-02 NOTE — Progress Notes (Signed)
Royston Sinner PX:[id number] DOB: [birth year] DOA: 06/02/2015 PCP: No primary care provider on file.  Brief narrative: 70 y/o ? No prior PMH Homeless Admitted from [facility] ED 06/02/15 with fungating breast lesion ~ 5 mo duration CT chest showed Necrotic mass with asymmetric nodes and  ?2.3 cm [place] aortic wall veg + liver and adrenal masses  WBC 14, Hb 8.1 PLt 512 BUN /Creat 25/1.4 CO2 20  K 3.3  Past medical history-As per Problem list Chart reviewed as below- oncology  Consultants:   Oncology  Gen surgery  Procedures:    Antibiotics:  unasyn  linezolide  Zosyn  vancomycin   Subjective   Alert pleasant in no distress Tells me has been living in her truck for 5 mo as has not been able to afford getting a place to stay since being evicted Gets SS check for 680/mo No close relatives around    Objective    Interim History:   Telemetry: Sinus/sinus tach   Objective: Filed Vitals:   06/02/15 0445 06/02/15 0500 06/02/15 0600 06/02/15 0700  BP: 150/87 162/86 133/69 112/66  Pulse: 85 77 77 80  Temp: 98.5 F (36.9 C)     TempSrc: Oral     Resp: 17 21 20 20   Height: 5\' 8"  (1.727 m)     Weight: 63.4 kg (139 lb 12.4 oz)     SpO2: 100% 100% 100% 100%    Intake/Output Summary (Last 24 hours) at 06/02/15 0727 Last data filed at 06/02/15 0700  Gross per 24 hour  Intake    265 ml  Output      0 ml  Net    265 ml    Exam:  General: alert .  A little confused and "spaced out" Cardiovascular: s1 s 2no m/r/g Respiratory: clear no added sound Abdomen:  Soft nt nd no rebound nor gaurding Skin Neuro intact but a little confused on/off  Data Reviewed: Basic Metabolic Panel:  Recent Labs Lab 06/02/15 0120  NA 136  K 4.6  CL 103  CO2 19*  GLUCOSE 87  BUN 29*  CREATININE 1.41*  CALCIUM 8.6*   Liver Function Tests: No results for input(s): AST, ALT, ALKPHOS, BILITOT, PROT, ALBUMIN in the last 168 hours. No results for input(s): LIPASE, AMYLASE in  the last 168 hours. No results for input(s): AMMONIA in the last 168 hours. CBC:  Recent Labs Lab 06/02/15 0120 06/02/15 0540  WBC 17.9* 14.2*  NEUTROABS 15.9*  --   HGB 9.3* 8.1*  HCT 28.8* 25.0*  MCV 75.0* 75.1*  PLT 553* 512*   Cardiac Enzymes: No results for input(s): CKTOTAL, CKMB, CKMBINDEX, TROPONINI in the last 168 hours. BNP: Invalid input(s): POCBNP CBG: No results for input(s): GLUCAP in the last 168 hours.  Recent Results (from the past 240 hour(s))  Surgical pcr screen     Status: None   Collection Time: 06/02/15  4:35 AM  Result Value Ref Range Status   MRSA, PCR NEGATIVE NEGATIVE Final   Staphylococcus aureus NEGATIVE NEGATIVE Final    Comment:        The Xpert SA Assay (FDA approved for NASAL specimens in patients over 24 years of age), is one component of a comprehensive surveillance program.  Test performance has been validated by [facility] for patients greater than or equal to 100 year old. It is not intended to diagnose infection nor to guide or monitor treatment.      Studies:  All Imaging reviewed and is as per above notation   Scheduled Meds: . ampicillin-sulbactam (UNASYN) IV  3 g Intravenous Q6H  . linezolid (ZYVOX) IV  600 mg Intravenous Once  . linezolid  600 mg Oral Q12H  . sodium chloride flush  3 mL Intravenous Q12H   Continuous Infusions: . sodium chloride 100 mL/hr at 06/02/15 0521     Assessment/Plan:  1. Fungating mass of breast with widespread local destruction-Cancer-asked Gen surg/Onology to eval.  Appreciate input.  Will have salvage mastectomy.  Further management re: Cancer work-up to be directed by Oncology and general surgery-Imagine a biopsy will be needed--she will need at least 24 hr delay as received contrast already for CT chest 2. Ascending aorta 3cm Asc. aorta vegetation-? needs systemic AC ion addition to long term Abx-appreciate ID input-suspect polymicrobial disease needing broad coverage.   Cardiology to evaluate to better characterize with ? TEE.  Follow HIV 3. Metabolic acidosis-No baseline.  Has presumed AKI-continue saline 100 cc/h.  Rpt Bmet am 4. Hypokalemia-? 2/2 to acidosis.  Monitor in am 5. Homelessness-Social worker to look into this   No family Keep SDU overnight monitor  Verneita Griffes, MD  [facility] Pager [phone] 06/02/2015, 7:27 AM    LOS: 0 days

## 2015-06-02 NOTE — Consult Note (Signed)
[facility] for Infectious Disease  Total days of antibiotics 2        Day 2 unasyn/linezolid               Reason for Consult: fungating breast wound    Referring Physician: samtani  Principal Problem:   Infection of left breast Active Problems:   Left breast mass   Breast cancer ([place])   AKI (acute kidney injury) ([place])   Breast infection   Homeless    HPI: Robin Cantrell is a 70 y.o. female who is homeless has been living in her truck for the past 4-5 months. She noticed having a left breast lesion x 5 month,which initially started as a small lesion. She has not sought out any medical care for this. she subscribes to pain over left breast with purulent drainage and foul smelling odor. On physical exam, she has extensive necrotic lesion over left breast. She also subscribes to fever, chills, and 20 pound weight loss. Her admit labs show leukocytosis of WBC 17.9k, elevated plt  at 553, Cr 1.41. CT of chest showed a necrotic left breast mass consistent with malignancy, asymmetric left axillary lymph nodes, but also 3cm vegetation attached to the ascending aortic wall, indeterminate liver/right adrenal masses. she was initiated on amp/sub plus linezolid. Leukocytosis mildly improved this morning. Patient being taken to the OR.  She is tearful during this interview  History reviewed. No pertinent past medical history.  Allergies: No Known Allergies  MEDICATIONS: . ampicillin-sulbactam (UNASYN) IV  3 g Intravenous Q6H  . linezolid  600 mg Oral Q12H  . sodium chloride flush  3 mL Intravenous Q12H    Social History  Substance Use Topics  . Smoking status: Former Research scientist (life sciences)  . Smokeless tobacco: None  . Alcohol Use: No    Family hx: no family history of breast cancer   Review of Systems  Constitutional: Negative for fever, chills, diaphoresis, activity change, appetite change, fatigue and unexpected weight change.  HENT: Negative for congestion, sore throat,  rhinorrhea, sneezing, trouble swallowing and sinus pressure.  Eyes: Negative for photophobia and visual disturbance.  Respiratory: Negative for cough, chest tightness, shortness of breath, wheezing and stridor.  Cardiovascular: Negative for chest pain, palpitations and leg swelling.  Gastrointestinal: Negative for nausea, vomiting, abdominal pain, diarrhea, constipation, blood in stool, abdominal distention and anal bleeding.  Genitourinary: Negative for dysuria, hematuria, flank pain and difficulty urinating.  Musculoskeletal: Negative for myalgias, back pain, joint swelling, arthralgias and gait problem.  Skin: Negative for color change, pallor, rash and wound.  Neurological: Negative for dizziness, tremors, weakness and light-headedness.  Hematological: Negative for adenopathy. Does not bruise/bleed easily.  Psychiatric/Behavioral: Negative for behavioral problems, confusion, sleep disturbance, dysphoric mood, decreased concentration and agitation.     OBJECTIVE: Temp:  [98.2 F (36.8 C)-98.8 F (37.1 C)] 98.8 F (37.1 C) (02/03 0700) Pulse Rate:  [69-85] 80 (02/03 0700) Resp:  [15-23] 20 (02/03 0700) BP: (109-162)/(66-88) 112/66 mmHg (02/03 0700) SpO2:  [99 %-100 %] 100 % (02/03 0700) Weight:  [139 lb 12.4 oz (63.4 kg)] 139 lb 12.4 oz (63.4 kg) (02/03 0445) Physical Exam  Constitutional:  oriented to person, place, and time. appears well-developed and well-nourished. No distress.  HENT: [place]/AT, PERRLA, no scleral icterus Mouth/Throat: Oropharynx is clear and moist. No oropharyngeal exudate.  Cardiovascular: Normal rate, regular rhythm and normal heart sounds. Exam reveals no gallop and no friction rub.  No murmur heard.  Chest wall: left breast nearly completely involved with  large ulcerative wound, dense fibrinous tissue, foul necrotic maladorous wound. Roughly 15 x 15cm height and length. Difficulty to assess depth due to base of wound covered with fibrinous debris. Please see  photo from general surgery consultation for other description. Pulmonary/Chest: Effort normal and breath sounds normal. No respiratory distress.  has no wheezes.  Neck = supple, no nuchal rigidity Abdominal: Soft. Bowel sounds are normal.  exhibits no distension. There is no tenderness.  Lymphadenopathy: no cervical adenopathy. No axillary adenopathy Neurological: alert and oriented to person, place, and time.  Psychiatric: a normal mood and affect.  behavior is normal.    LABS: Results for orders placed or performed during the hospital encounter of 06/02/15 (from the past 48 hour(s))  CBC with Differential/Platelet     Status: Abnormal   Collection Time: 06/02/15  1:20 AM  Result Value Ref Range   WBC 17.9 (H) 4.0 - 10.5 K/uL   RBC 3.84 (L) 3.87 - 5.11 MIL/uL   Hemoglobin 9.3 (L) 12.0 - 15.0 g/dL   HCT 28.8 (L) 36.0 - 46.0 %   MCV 75.0 (L) 78.0 - 100.0 fL   MCH 24.2 (L) 26.0 - 34.0 pg   MCHC 32.3 30.0 - 36.0 g/dL   RDW 14.2 11.5 - 15.5 %   Platelets 553 (H) 150 - 400 K/uL   Neutrophils Relative % 89 %   Neutro Abs 15.9 (H) 1.7 - 7.7 K/uL   Lymphocytes Relative 7 %   Lymphs Abs 1.2 0.7 - 4.0 K/uL   Monocytes Relative 4 %   Monocytes Absolute 0.8 0.1 - 1.0 K/uL   Eosinophils Relative 0 %   Eosinophils Absolute 0.0 0.0 - 0.7 K/uL   Basophils Relative 0 %   Basophils Absolute 0.0 0.0 - 0.1 K/uL  Basic metabolic panel     Status: Abnormal   Collection Time: 06/02/15  1:20 AM  Result Value Ref Range   Sodium 136 135 - 145 mmol/L   Potassium 4.6 3.5 - 5.1 mmol/L   Chloride 103 101 - 111 mmol/L   CO2 19 (L) 22 - 32 mmol/L   Glucose, Bld 87 65 - 99 mg/dL   BUN 29 (H) 6 - 20 mg/dL   Creatinine, Ser 1.41 (H) 0.44 - 1.00 mg/dL   Calcium 8.6 (L) 8.9 - 10.3 mg/dL   GFR calc non Af Amer 38 (L) >60 mL/min   GFR calc Af Amer 44 (L) >60 mL/min    Comment: (NOTE) The eGFR has been calculated using the CKD EPI equation. This calculation has not been validated in all clinical  situations. eGFR's persistently <60 mL/min signify possible Chronic Kidney Disease.    Anion gap 14 5 - 15  Ethanol     Status: None   Collection Time: 06/02/15  1:20 AM  Result Value Ref Range   Alcohol, Ethyl (B) <5 <5 mg/dL    Comment:        LOWEST DETECTABLE LIMIT FOR SERUM ALCOHOL IS 5 mg/dL FOR MEDICAL PURPOSES ONLY   I-Stat CG4 Lactic Acid, ED     Status: None   Collection Time: 06/02/15  1:26 AM  Result Value Ref Range   Lactic Acid, Venous 1.43 0.5 - 2.0 mmol/L  Urine rapid drug screen (hosp performed)     Status: None   Collection Time: 06/02/15  3:55 AM  Result Value Ref Range   Opiates NONE DETECTED NONE DETECTED   Cocaine NONE DETECTED NONE DETECTED   Benzodiazepines NONE DETECTED NONE DETECTED  Amphetamines NONE DETECTED NONE DETECTED   Tetrahydrocannabinol NONE DETECTED NONE DETECTED   Barbiturates NONE DETECTED NONE DETECTED    Comment:        DRUG SCREEN FOR MEDICAL PURPOSES ONLY.  IF CONFIRMATION IS NEEDED FOR ANY PURPOSE, NOTIFY LAB WITHIN 5 DAYS.        LOWEST DETECTABLE LIMITS FOR URINE DRUG SCREEN Drug Class       Cutoff (ng/mL) Amphetamine      1000 Barbiturate      200 Benzodiazepine   725 Tricyclics       366 Opiates          300 Cocaine          300 THC              50   Surgical pcr screen     Status: None   Collection Time: 06/02/15  4:35 AM  Result Value Ref Range   MRSA, PCR NEGATIVE NEGATIVE   Staphylococcus aureus NEGATIVE NEGATIVE    Comment:        The Xpert SA Assay (FDA approved for NASAL specimens in patients over 11 years of age), is one component of a comprehensive surveillance program.  Test performance has been validated by [facility] for patients greater than or equal to 73 year old. It is not intended to diagnose infection nor to guide or monitor treatment.   Protime-INR     Status: Abnormal   Collection Time: 06/02/15  5:40 AM  Result Value Ref Range   Prothrombin Time 15.9 (H) 11.6 - 15.2 seconds   INR  1.26 0.00 - 1.49  APTT     Status: Abnormal   Collection Time: 06/02/15  5:40 AM  Result Value Ref Range   aPTT 45 (H) 24 - 37 seconds    Comment:        IF BASELINE aPTT IS ELEVATED, SUGGEST PATIENT RISK ASSESSMENT BE USED TO DETERMINE APPROPRIATE ANTICOAGULANT THERAPY.   Comprehensive metabolic panel     Status: Abnormal   Collection Time: 06/02/15  5:40 AM  Result Value Ref Range   Sodium 137 135 - 145 mmol/L   Potassium 3.3 (L) 3.5 - 5.1 mmol/L   Chloride 107 101 - 111 mmol/L   CO2 20 (L) 22 - 32 mmol/L   Glucose, Bld 113 (H) 65 - 99 mg/dL   BUN 25 (H) 6 - 20 mg/dL   Creatinine, Ser 1.45 (H) 0.44 - 1.00 mg/dL   Calcium 7.5 (L) 8.9 - 10.3 mg/dL   Total Protein 5.3 (L) 6.5 - 8.1 g/dL   Albumin 1.6 (L) 3.5 - 5.0 g/dL   AST 14 (L) 15 - 41 U/L   ALT 17 14 - 54 U/L   Alkaline Phosphatase 103 38 - 126 U/L   Total Bilirubin 0.5 0.3 - 1.2 mg/dL   GFR calc non Af Amer 36 (L) >60 mL/min   GFR calc Af Amer 42 (L) >60 mL/min    Comment: (NOTE) The eGFR has been calculated using the CKD EPI equation. This calculation has not been validated in all clinical situations. eGFR's persistently <60 mL/min signify possible Chronic Kidney Disease.    Anion gap 10 5 - 15  CBC     Status: Abnormal   Collection Time: 06/02/15  5:40 AM  Result Value Ref Range   WBC 14.2 (H) 4.0 - 10.5 K/uL   RBC 3.33 (L) 3.87 - 5.11 MIL/uL   Hemoglobin 8.1 (L) 12.0 - 15.0 g/dL  HCT 25.0 (L) 36.0 - 46.0 %   MCV 75.1 (L) 78.0 - 100.0 fL   MCH 24.3 (L) 26.0 - 34.0 pg   MCHC 32.4 30.0 - 36.0 g/dL   RDW 14.1 11.5 - 15.5 %   Platelets 512 (H) 150 - 400 K/uL  Type and screen [place]     Status: None   Collection Time: 06/02/15  6:00 AM  Result Value Ref Range   ABO/RH(D) O POS    Antibody Screen NEG    Sample Expiration 06/05/2015   ABO/Rh     Status: None   Collection Time: 06/02/15  6:00 AM  Result Value Ref Range   ABO/RH(D) O POS   Glucose, capillary     Status: Abnormal    Collection Time: 06/02/15  7:39 AM  Result Value Ref Range   Glucose-Capillary 133 (H) 65 - 99 mg/dL   Comment 1 Capillary Specimen     MICRO: 2/3 blood cx ngtd mrsa and staph aureus screen is negative IMAGING: Ct Chest W Contrast  06/02/2015  CLINICAL DATA:  Evaluate for left breast mass versus malignancy. EXAM: CT CHEST WITH CONTRAST TECHNIQUE: Multidetector CT imaging of the chest was performed during intravenous contrast administration. CONTRAST:  70m OMNIPAQUE IOHEXOL 300 MG/ML  SOLN COMPARISON:  None. FINDINGS: THORACIC INLET/BODY WALL: The entire left breast is affected by a broad-based ulcerated mass with peripheral enhancement, heaped up margins, and nonenhancing debris in the ulcer base. Maximal diameter is approximately 13 cm. No drainable fluid collection. This is has the appearance of a necrotic malignancy. There is likely involvement of the pectoralis muscles but no bony invasion noted. Left axillary nodes are within normal limits in size, but asymmetric and suspicious soley based on the size of breast mass. No right breast lesion is seen. MEDIASTINUM: No cardiomegaly or pericardial effusion. Atherosclerosis, including the coronary arteries. There is a hypodense filling defect attached to the left wall of the ascending aorta which measures up to 3 cm. On reformats, this appears to have a narrow base along the wall which is not otherwise remarkable for thickening or calcification. Atherosclerosis is present along the aorta and great vessels, without dissection or other aerated notable mural thrombus. There is no indication of valvular disease/vegetation. No lymphadenopathy, including along the left IMA distribution LUNG WINDOWS: Mild centrilobular emphysema. There is no edema, consolidation, effusion, or pneumothorax. No suspicious nodule. UPPER ABDOMEN: There is a 10 mm low-density in the upper left liver which does not measured cystic density. Indeterminate 13 mm right adrenal mass.  OSSEOUS: No acute fracture.  No suspicious lytic or blastic lesions. IMPRESSION: 1. 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 As questioned clinically, no abscess.Small but asymmetric left axillary lymph nodes. 2. 3 cm vegetation attached to the ascending aortic wall. This would be unusual for bland atherosclerotic thrombus, correlate for systemic infection/bacteremia. 3. Indeterminate liver and right adrenal masses. Recommend abdominal staging after workup of #1. Electronically Signed   By: JMonte FantasiaM.D.   On: 06/02/2015 03:24    Assessment/Plan:  [id number] with left necrotic breast mass likely related to undiagnosed breast cancer but also found to have vegetation to aortic arch  - currently on amp/sub plus linezolid. Await culture results to narrow abtx - appreciate surgery for doing debridement as well as work up for staging - vegetation to ascending aortic wall - wonder if getting CT-A would better characterize 2.3cm vegetation to ascending aortic wall. Will discuss with radiology - recommend to get  TEE for evaluation of heart valve, since TTE will not be able to be done due to chest wall/breast lesion - health maintenance = will check hep c and hiv ab - dispo = anticipate that patient will need numerous social service, community resources but predominantly also stable housing since she will need to care for her wound/possibly cancer treatment   Dr. Tommy Medal to provide further recs over the weekend

## 2015-06-02 NOTE — Anesthesia Preprocedure Evaluation (Addendum)
Anesthesia Evaluation  Patient identified by MRN, date of birth, ID band Patient awake    Reviewed: Allergy & Precautions, NPO status , Patient's Chart, lab work & pertinent test results  Airway Mallampati: II  TM Distance: >3 FB Neck ROM: Full    Dental no notable dental hx. (+) Dental Advisory Given, Poor Dentition, Missing   Pulmonary former smoker,  Mild centrilobular emphysema. There is no edema, consolidation, effusion, or pneumothorax. No suspicious nodule.   Pulmonary exam normal breath sounds clear to auscultation       Cardiovascular negative cardio ROS Normal cardiovascular exam Rhythm:Regular Rate:Normal     Neuro/Psych negative neurological ROS  negative psych ROS   GI/Hepatic negative GI ROS, Neg liver ROS,   Endo/Other  negative endocrine ROS  Renal/GU Renal InsufficiencyRenal disease  negative genitourinary   Musculoskeletal negative musculoskeletal ROS (+)   Abdominal   Peds negative pediatric ROS (+)  Hematology  (+) anemia ,   Anesthesia Other Findings Upper has two teeth (incisors) only.  Lower middle row of teeth all in poor condition and right premolar is very loose.  Reproductive/Obstetrics negative OB ROS                            Anesthesia Physical Anesthesia Plan  ASA: III  Anesthesia Plan: General   Post-op Pain Management:    Induction: Intravenous  Airway Management Planned: Oral ETT  Additional Equipment:   Intra-op Plan:   Post-operative Plan: Extubation in OR  Informed Consent: I have reviewed the patients History and Physical, chart, labs and discussed the procedure including the risks, benefits and alternatives for the proposed anesthesia with the patient or authorized representative who has indicated his/her understanding and acceptance.   Dental advisory given  Plan Discussed with: CRNA and Surgeon  Anesthesia Plan Comments:          Anesthesia Quick Evaluation

## 2015-06-03 ENCOUNTER — Inpatient Hospital Stay (HOSPITAL_COMMUNITY): Payer: Medicare HMO

## 2015-06-03 ENCOUNTER — Encounter (HOSPITAL_COMMUNITY): Payer: Self-pay | Admitting: Radiology

## 2015-06-03 DIAGNOSIS — C50812 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: Secondary | ICD-10-CM | POA: Diagnosis present

## 2015-06-03 DIAGNOSIS — I779 Disorder of arteries and arterioles, unspecified: Secondary | ICD-10-CM

## 2015-06-03 DIAGNOSIS — C50919 Malignant neoplasm of unspecified site of unspecified female breast: Secondary | ICD-10-CM | POA: Insufficient documentation

## 2015-06-03 DIAGNOSIS — C799 Secondary malignant neoplasm of unspecified site: Secondary | ICD-10-CM

## 2015-06-03 DIAGNOSIS — D62 Acute posthemorrhagic anemia: Secondary | ICD-10-CM

## 2015-06-03 LAB — GLUCOSE, CAPILLARY: Glucose-Capillary: 125 mg/dL — ABNORMAL HIGH (ref 65–99)

## 2015-06-03 LAB — IRON AND TIBC
Iron: 6 ug/dL — ABNORMAL LOW (ref 28–170)
Saturation Ratios: 4 % — ABNORMAL LOW (ref 10.4–31.8)
TIBC: 169 ug/dL — ABNORMAL LOW (ref 250–450)
UIBC: 163 ug/dL

## 2015-06-03 LAB — COMPREHENSIVE METABOLIC PANEL
ALBUMIN: 1.6 g/dL — AB (ref 3.5–5.0)
ALT: 15 U/L (ref 14–54)
ANION GAP: 10 (ref 5–15)
AST: 15 U/L (ref 15–41)
Alkaline Phosphatase: 63 U/L (ref 38–126)
BUN: 13 mg/dL (ref 6–20)
CHLORIDE: 108 mmol/L (ref 101–111)
CO2: 18 mmol/L — AB (ref 22–32)
Calcium: 7.9 mg/dL — ABNORMAL LOW (ref 8.9–10.3)
Creatinine, Ser: 1.19 mg/dL — ABNORMAL HIGH (ref 0.44–1.00)
GFR calc Af Amer: 54 mL/min — ABNORMAL LOW (ref >60)
GFR calc non Af Amer: 46 mL/min — ABNORMAL LOW (ref >60)
GLUCOSE: 183 mg/dL — AB (ref 65–99)
POTASSIUM: 4 mmol/L (ref 3.5–5.1)
SODIUM: 136 mmol/L (ref 135–145)
Total Bilirubin: 0.5 mg/dL (ref 0.3–1.2)
Total Protein: 5.1 g/dL — ABNORMAL LOW (ref 6.5–8.1)

## 2015-06-03 LAB — PROTIME-INR
INR: 1.18 (ref 0.00–1.49)
Prothrombin Time: 15.2 seconds (ref 11.6–15.2)

## 2015-06-03 LAB — CBC
HEMATOCRIT: 20.7 % — AB (ref 36.0–46.0)
HEMOGLOBIN: 6.6 g/dL — AB (ref 12.0–15.0)
MCH: 24.3 pg — ABNORMAL LOW (ref 26.0–34.0)
MCHC: 31.9 g/dL (ref 30.0–36.0)
MCV: 76.1 fL — AB (ref 78.0–100.0)
Platelets: 399 10*3/uL (ref 150–400)
RBC: 2.72 MIL/uL — ABNORMAL LOW (ref 3.87–5.11)
RDW: 14.2 % (ref 11.5–15.5)
WBC: 12.2 10*3/uL — ABNORMAL HIGH (ref 4.0–10.5)

## 2015-06-03 LAB — VITAMIN B12: Vitamin B-12: 643 pg/mL (ref 180–914)

## 2015-06-03 LAB — RETICULOCYTES
RBC.: 2.89 MIL/uL — AB (ref 3.87–5.11)
RETIC COUNT ABSOLUTE: 28.9 10*3/uL (ref 19.0–186.0)
Retic Ct Pct: 1 % (ref 0.4–3.1)

## 2015-06-03 LAB — FERRITIN: FERRITIN: 55 ng/mL (ref 11–307)

## 2015-06-03 LAB — PREPARE RBC (CROSSMATCH)

## 2015-06-03 MED ORDER — SODIUM CHLORIDE 0.9 % IV SOLN: Freq: Once | INTRAVENOUS | Status: DC

## 2015-06-03 MED ORDER — IOHEXOL 350 MG/ML SOLN
80.0000 mL | Freq: Once | INTRAVENOUS | Status: AC | PRN
  Administered 2015-06-03: 80 mL via INTRAVENOUS

## 2015-06-03 MED ORDER — SODIUM CHLORIDE 0.9 % IV SOLN
125.0000 mg | Freq: Once | INTRAVENOUS | Status: AC
  Administered 2015-06-03: 125 mg via INTRAVENOUS
  Filled 2015-06-03: qty 10

## 2015-06-03 NOTE — Progress Notes (Signed)
MD ordered for pt to have labs drawn for pediatric tubes only due to hgb 6.6. Labs rejected due to low specimen volumes. Lab tech to redraw samples in regular tubes.

## 2015-06-03 NOTE — Progress Notes (Signed)
Robin Cantrell PX:[id number] DOB: [birth year] DOA: 06/02/2015 PCP: No primary care provider on file.  Brief narrative: 70 y/o ? No prior PMH Homeless Admitted from [facility] ED 06/02/15 with fungating breast lesion ~ 5 mo duration CT chest showed Necrotic mass with asymmetric nodes and  ?2.3 cm [place] aortic wall veg + liver and adrenal masses  WBC 14, Hb 8.1 PLt 512 BUN /Creat 25/1.4 CO2 20  K 3.3  Past medical history-As per Problem list Chart reviewed as below- oncology  Consultants:   Oncology  Gen surgery  Procedures:    Antibiotics:  unasyn  linezolide  Zosyn  vancomycin   Subjective   declines blood transfusion secondary to religious reasons    Objective    Interim History:   Telemetry: Sinus/sinus tach   Objective: Filed Vitals:   06/03/15 0500 06/03/15 0600 06/03/15 0700 06/03/15 0754  BP:    105/66  Pulse: 60 58 52 57  Temp:    98.6 F (37 C)  TempSrc:    Oral  Resp: 10 9 9 11   Height:      Weight:      SpO2: 100% 100% 100% 100%    Intake/Output Summary (Last 24 hours) at 06/03/15 0852 Last data filed at 06/03/15 0700  Gross per 24 hour  Intake   4950 ml  Output    650 ml  Net   4300 ml    Exam:  General: alert .  A little confused and "spaced out" Cardiovascular: s1 s 2no m/r/g Respiratory: clear no added sound Abdomen:  Soft nt nd no rebound nor gaurding Skin Neuro intact but a little confused on/off  Data Reviewed: Basic Metabolic Panel:  Recent Labs Lab 06/02/15 0120 06/02/15 0540 06/03/15 0237  NA 136 137 136  K 4.6 3.3* 4.0  CL 103 107 108  CO2 19* 20* 18*  GLUCOSE 87 113* 183*  BUN 29* 25* 13  CREATININE 1.41* 1.45* 1.19*  CALCIUM 8.6* 7.5* 7.9*   Liver Function Tests:  Recent Labs Lab 06/02/15 0540 06/03/15 0237  AST 14* 15  ALT 17 15  ALKPHOS 103 63  BILITOT 0.5 0.5  PROT 5.3* 5.1*  ALBUMIN 1.6* 1.6*   No results for input(s): LIPASE, AMYLASE in the last 168 hours. No results for input(s):  AMMONIA in the last 168 hours. CBC:  Recent Labs Lab 06/02/15 0120 06/02/15 0540 06/03/15 0237  WBC 17.9* 14.2* 12.2*  NEUTROABS 15.9*  --   --   HGB 9.3* 8.1* 6.6*  HCT 28.8* 25.0* 20.7*  MCV 75.0* 75.1* 76.1*  PLT 553* 512* 399   Cardiac Enzymes: No results for input(s): CKTOTAL, CKMB, CKMBINDEX, TROPONINI in the last 168 hours. BNP: Invalid input(s): POCBNP CBG:  Recent Labs Lab 06/02/15 0739  GLUCAP 133*    Recent Results (from the past 240 hour(s))  Surgical pcr screen     Status: None   Collection Time: 06/02/15  4:35 AM  Result Value Ref Range Status   MRSA, PCR NEGATIVE NEGATIVE Final   Staphylococcus aureus NEGATIVE NEGATIVE Final    Comment:        The Xpert SA Assay (FDA approved for NASAL specimens in patients over 48 years of age), is one component of a comprehensive surveillance program.  Test performance has been validated by [facility] for patients greater than or equal to 21 year old. It is not intended to diagnose infection nor to guide or monitor treatment.      Studies:  All Imaging reviewed and is as per above notation   Scheduled Meds: . sodium chloride   Intravenous Once  . ampicillin-sulbactam (UNASYN) IV  3 g Intravenous Q6H  . sodium chloride flush  3 mL Intravenous Q12H  . vancomycin  1,000 mg Intravenous Q24H   Continuous Infusions: . sodium chloride 100 mL/hr at 06/02/15 2000     Assessment/Plan:  1. Fungating mass of breast with widespread local status post salvage mastectomy.  Further management re: Cancer work-up to be directed by Oncology and general surgery-Imagine a biopsy will be needed--await staging with CT abdomen and pelvis, CT head rule out metastases. 2. Symptomatic anemia, possible acute blood loss component-hemoglobin on admission 9.3-->6.6. Potentially related to surgery and also underlying poor production from either anemia of chronic disease as well as cancer status. We will draw labs in  pediatric tubes and limit continued blood draws-get anemia panel although almost sure this is iron def. She si amenable to IV iron.  Anticoagulation might be relatively contraindicated 3. Ascending aorta 3cm Asc. aorta vegetation-? needs systemic AC ion addition to long term Abx-appreciate ID input-suspect polymicrobial disease needing broad coverage.  Cardiology to evaluate to better characterize with ? TEE vs CTA chest  Follow HIV 4. AKI-Creatinine better trending 1.49-1.1.  continue IV saline 100 cc/hr.  monitor 5. Metabolic acidosis-No baseline.  Has presumed AKI-continue saline 100 cc/h.  start sodium bicarbonate in a.m. if further worsening of acidosis 6. Hypokalemia-? 2/2 to acidosis.  Monitor in am-this isresolving 7. Homelessness-Social worker to look into this 8. Bradycardia-seesm only at night abnd  Monitors are reassuring.  Keep on tele for now however   Keep SDU overnight-->traanfer to tele if remaines stable    Verneita Griffes, MD  [facility] Pager [phone] 06/03/2015, 8:52 AM    LOS: 1 day

## 2015-06-03 NOTE — Progress Notes (Addendum)
Subjective: No new complaints   Antibiotics:  Anti-infectives    Start     Dose/Rate Route Frequency Ordered Stop   06/03/15 0000  vancomycin (VANCOCIN) IVPB 1000 mg/200 mL premix     1,000 mg 200 mL/hr over 60 Minutes Intravenous Every 24 hours 06/02/15 2350     06/02/15 2000  linezolid (ZYVOX) tablet 600 mg  Status:  Discontinued     600 mg Oral Every 12 hours 06/02/15 0454 06/02/15 2310   06/02/15 0600  linezolid (ZYVOX) IVPB 600 mg     600 mg 300 mL/hr over 60 Minutes Intravenous  Once 06/02/15 0454 06/02/15 0958   06/02/15 0600  Ampicillin-Sulbactam (UNASYN) 3 g in sodium chloride 0.9 % 100 mL IVPB    Comments:  Unasyn 3 g IV q6h for CrCl > 30 mL/min   3 g 100 mL/hr over 60 Minutes Intravenous Every 6 hours 06/02/15 0458     06/02/15 0100  vancomycin (VANCOCIN) 1,500 mg in sodium chloride 0.9 % 500 mL IVPB     1,500 mg 250 mL/hr over 120 Minutes Intravenous  Once 06/02/15 0055 06/02/15 0502   06/02/15 0100  piperacillin-tazobactam (ZOSYN) IVPB 3.375 g     3.375 g 100 mL/hr over 30 Minutes Intravenous  Once 06/02/15 0055 06/02/15 0202      Medications: Scheduled Meds: . sodium chloride   Intravenous Once  . ampicillin-sulbactam (UNASYN) IV  3 g Intravenous Q6H  . sodium chloride flush  3 mL Intravenous Q12H  . vancomycin  1,000 mg Intravenous Q24H   Continuous Infusions: . sodium chloride 100 mL/hr at 06/02/15 2000   PRN Meds:.acetaminophen **OR** acetaminophen, alum & mag hydroxide-simeth, HYDROcodone-acetaminophen, morphine injection, ondansetron **OR** ondansetron (ZOFRAN) IV, oxyCODONE-acetaminophen    Objective: Weight change:   Intake/Output Summary (Last 24 hours) at 06/03/15 1255 Last data filed at 06/03/15 1200  Gross per 24 hour  Intake   5010 ml  Output    875 ml  Net   4135 ml   Blood pressure 117/66, pulse 64, temperature 98.5 F (36.9 C), temperature source Oral, resp. rate 16, height 5\' 8"  (1.727 m), weight 139 lb 12.4 oz (63.4  kg), SpO2 100 %. Temp:  [97.3 F (36.3 C)-98.6 F (37 C)] 98.5 F (36.9 C) (02/04 1138) Pulse Rate:  [46-150] 64 (02/04 1200) Resp:  [8-23] 16 (02/04 1200) BP: (105-138)/(62-97) 117/66 mmHg (02/04 1138) SpO2:  [98 %-100 %] 100 % (02/04 1200)  Physical Exam: General: Alert and awake, oriented x3, not in any acute distress. HEENT: anicteric sclera,EOMI CVS regular rate, normal r,  no murmur rubs or gallops Chest: clear to auscultation bilaterally, no wheezing, rales or rhonchi  Abdomen: soft nontender, nondistended, normal bowel sounds, Extremities: no  clubbing or edema noted bilaterally Skin: left chest with dressing in place vaccum Neuro: nonfocal  CBC:  CBC Latest Ref Rng 06/03/2015 06/02/2015 06/02/2015  WBC 4.0 - 10.5 K/uL 12.2(H) 14.2(H) 17.9(H)  Hemoglobin 12.0 - 15.0 g/dL 6.6(LL) 8.1(L) 9.3(L)  Hematocrit 36.0 - 46.0 % 20.7(L) 25.0(L) 28.8(L)  Platelets 150 - 400 K/uL 399 512(H) 553(H)      BMET  Recent Labs  06/02/15 0540 06/03/15 0237  NA 137 136  K 3.3* 4.0  CL 107 108  CO2 20* 18*  GLUCOSE 113* 183*  BUN 25* 13  CREATININE 1.45* 1.19*  CALCIUM 7.5* 7.9*     Liver Panel   Recent Labs  06/02/15 0540 06/03/15 0237  PROT 5.3* 5.1*  ALBUMIN 1.6* 1.6*  AST 14* 15  ALT 17 15  ALKPHOS 103 63  BILITOT 0.5 0.5       Sedimentation Rate No results for input(s): ESRSEDRATE in the last 72 hours. C-Reactive Protein No results for input(s): CRP in the last 72 hours.  Micro Results: Recent Results (from the past 720 hour(s))  Culture, blood (Routine X 2) w Reflex to ID Panel     Status: None (Preliminary result)   Collection Time: 06/02/15  1:10 AM  Result Value Ref Range Status   Specimen Description BLOOD RIGHT ARM  Final   Special Requests BOTTLES DRAWN AEROBIC AND ANAEROBIC 10ML  Final   Culture  Setup Time   Final    GRAM POSITIVE RODS AEROBIC BOTTLE ONLY CRITICAL RESULT CALLED TO, READ BACK BY AND VERIFIED WITH: Youlanda Roys AT [id number] 06/03/15  BY L BENFIELD    Culture PENDING  Incomplete   Report Status PENDING  Incomplete  Surgical pcr screen     Status: None   Collection Time: 06/02/15  4:35 AM  Result Value Ref Range Status   MRSA, PCR NEGATIVE NEGATIVE Final   Staphylococcus aureus NEGATIVE NEGATIVE Final    Comment:        The Xpert SA Assay (FDA approved for NASAL specimens in patients over 100 years of age), is one component of a comprehensive surveillance program.  Test performance has been validated by [facility] for patients greater than or equal to 26 year old. It is not intended to diagnose infection nor to guide or monitor treatment.     Studies/Results: Ct Chest W Contrast  06/02/2015  CLINICAL DATA:  Evaluate for left breast mass versus malignancy. EXAM: CT CHEST WITH CONTRAST TECHNIQUE: Multidetector CT imaging of the chest was performed during intravenous contrast administration. CONTRAST:  55mL OMNIPAQUE IOHEXOL 300 MG/ML  SOLN COMPARISON:  None. FINDINGS: THORACIC INLET/BODY WALL: The entire left breast is affected by a broad-based ulcerated mass with peripheral enhancement, heaped up margins, and nonenhancing debris in the ulcer base. Maximal diameter is approximately 13 cm. No drainable fluid collection. This is has the appearance of a necrotic malignancy. There is likely involvement of the pectoralis muscles but no bony invasion noted. Left axillary nodes are within normal limits in size, but asymmetric and suspicious soley based on the size of breast mass. No right breast lesion is seen. MEDIASTINUM: No cardiomegaly or pericardial effusion. Atherosclerosis, including the coronary arteries. There is a hypodense filling defect attached to the left wall of the ascending aorta which measures up to 3 cm. On reformats, this appears to have a narrow base along the wall which is not otherwise remarkable for thickening or calcification. Atherosclerosis is present along the aorta and great vessels, without dissection  or other aerated notable mural thrombus. There is no indication of valvular disease/vegetation. No lymphadenopathy, including along the left IMA distribution LUNG WINDOWS: Mild centrilobular emphysema. There is no edema, consolidation, effusion, or pneumothorax. No suspicious nodule. UPPER ABDOMEN: There is a 10 mm low-density in the upper left liver which does not measured cystic density. Indeterminate 13 mm right adrenal mass. OSSEOUS: No acute fracture.  No suspicious lytic or blastic lesions. IMPRESSION: 1. 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 As questioned clinically, no abscess.Small but asymmetric left axillary lymph nodes. 2. 3 cm vegetation attached to the ascending aortic wall. This would be unusual for bland atherosclerotic thrombus, correlate for systemic infection/bacteremia. 3. Indeterminate liver and right adrenal masses. Recommend abdominal staging after workup  of #1. Electronically Signed   By: Monte Fantasia M.D.   On: 06/02/2015 03:24      Assessment/Plan:  INTERVAL HISTORY 06/02/15: pt had mastectomy with placement of wound vacuum 06/03/15: blood culture with GPR 1/2 (2nd one without report yet)   Principal Problem:   Infection of left breast Active Problems:   Left breast mass   Breast cancer ([place])   AKI (acute kidney injury) ([place])   Breast infection   Homeless   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HCC)    Robin Cantrell is a 70 y.o. female with  Necrotic breast likely due to breast cancer with superimposed infection also with vegetation in aortic arch  #1 Necrotic breast tissue:  await pathology Continue vancomycin and unasyn  #2 GPR in blood: Not clear if true pathogen or contaminant, should be covered  #3 Vegetation in aortic arch: agree with TEE and CT when able to do studies       #2    LOS: 1 day   Alcide Evener 06/03/2015, 12:55 PM

## 2015-06-03 NOTE — Progress Notes (Signed)
CRITICAL VALUE ALERT  Critical value received:  Hgb 6.6  Date of notification:  06/03/2015  Time of notification:  03:22  Critical value read back:Yes.    Nurse who received alert:  Paulene Floor, RN  MD notified (1st page):  Rogue Bussing  Time of first page:  3:26 AM  Responding MD:  Rogue Bussing  Time MD responded:  03:29  Paulene Floor, RN 06/03/2015 4:09 AM

## 2015-06-03 NOTE — Progress Notes (Signed)
1 Day Post-Op  Subjective: Comfortable regarding left chest  Objective: Vital signs in last 24 hours: Temp:  [97.3 F (36.3 C)-98.6 F (37 C)] 98.6 F (37 C) (02/04 0754) Pulse Rate:  [46-86] 74 (02/04 0900) Resp:  [8-23] 23 (02/04 0900) BP: (105-138)/(62-97) 105/66 mmHg (02/04 0754) SpO2:  [98 %-100 %] 100 % (02/04 0900)    Intake/Output from previous day: 02/03 0701 - 02/04 0700 In: 4950 [I.V.:4200; IV Piggyback:750] Out: 650 [Urine:450; Drains:100; Blood:100] Intake/Output this shift: Total I/O In: 1340 [P.O.:1040; I.V.:300] Out: 64 [Urine:75]  VAC in place over mastectomy.  No obvious hematoma.  Surrounding skin looks viable  Lab Results:   Recent Labs  06/02/15 0540 06/03/15 0237  WBC 14.2* 12.2*  HGB 8.1* 6.6*  HCT 25.0* 20.7*  PLT 512* 399   BMET  Recent Labs  06/02/15 0540 06/03/15 0237  NA 137 136  K 3.3* 4.0  CL 107 108  CO2 20* 18*  GLUCOSE 113* 183*  BUN 25* 13  CREATININE 1.45* 1.19*  CALCIUM 7.5* 7.9*   PT/INR  Recent Labs  06/02/15 0540 06/03/15 0237  LABPROT 15.9* 15.2  INR 1.26 1.18   ABG No results for input(s): PHART, HCO3 in the last 72 hours.  Invalid input(s): PCO2, PO2  Studies/Results: Ct Chest W Contrast  06/02/2015  CLINICAL DATA:  Evaluate for left breast mass versus malignancy. EXAM: CT CHEST WITH CONTRAST TECHNIQUE: Multidetector CT imaging of the chest was performed during intravenous contrast administration. CONTRAST:  81mL OMNIPAQUE IOHEXOL 300 MG/ML  SOLN COMPARISON:  None. FINDINGS: THORACIC INLET/BODY WALL: The entire left breast is affected by a broad-based ulcerated mass with peripheral enhancement, heaped up margins, and nonenhancing debris in the ulcer base. Maximal diameter is approximately 13 cm. No drainable fluid collection. This is has the appearance of a necrotic malignancy. There is likely involvement of the pectoralis muscles but no bony invasion noted. Left axillary nodes are within normal limits in  size, but asymmetric and suspicious soley based on the size of breast mass. No right breast lesion is seen. MEDIASTINUM: No cardiomegaly or pericardial effusion. Atherosclerosis, including the coronary arteries. There is a hypodense filling defect attached to the left wall of the ascending aorta which measures up to 3 cm. On reformats, this appears to have a narrow base along the wall which is not otherwise remarkable for thickening or calcification. Atherosclerosis is present along the aorta and great vessels, without dissection or other aerated notable mural thrombus. There is no indication of valvular disease/vegetation. No lymphadenopathy, including along the left IMA distribution LUNG WINDOWS: Mild centrilobular emphysema. There is no edema, consolidation, effusion, or pneumothorax. No suspicious nodule. UPPER ABDOMEN: There is a 10 mm low-density in the upper left liver which does not measured cystic density. Indeterminate 13 mm right adrenal mass. OSSEOUS: No acute fracture.  No suspicious lytic or blastic lesions. IMPRESSION: 1. 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 As questioned clinically, no abscess.Small but asymmetric left axillary lymph nodes. 2. 3 cm vegetation attached to the ascending aortic wall. This would be unusual for bland atherosclerotic thrombus, correlate for systemic infection/bacteremia. 3. Indeterminate liver and right adrenal masses. Recommend abdominal staging after workup of #1. Electronically Signed   By: Monte Fantasia M.D.   On: 06/02/2015 03:24    Anti-infectives: Anti-infectives    Start     Dose/Rate Route Frequency Ordered Stop   06/03/15 0000  vancomycin (VANCOCIN) IVPB 1000 mg/200 mL premix     1,000 mg 200 mL/hr  over 60 Minutes Intravenous Every 24 hours 06/02/15 2350     06/02/15 2000  linezolid (ZYVOX) tablet 600 mg  Status:  Discontinued     600 mg Oral Every 12 hours 06/02/15 0454 06/02/15 2310   06/02/15 0600  linezolid (ZYVOX) IVPB 600 mg      600 mg 300 mL/hr over 60 Minutes Intravenous  Once 06/02/15 0454 06/02/15 0958   06/02/15 0600  Ampicillin-Sulbactam (UNASYN) 3 g in sodium chloride 0.9 % 100 mL IVPB    Comments:  Unasyn 3 g IV q6h for CrCl > 30 mL/min   3 g 100 mL/hr over 60 Minutes Intravenous Every 6 hours 06/02/15 0458     06/02/15 0100  vancomycin (VANCOCIN) 1,500 mg in sodium chloride 0.9 % 500 mL IVPB     1,500 mg 250 mL/hr over 120 Minutes Intravenous  Once 06/02/15 0055 06/02/15 0502   06/02/15 0100  piperacillin-tazobactam (ZOSYN) IVPB 3.375 g     3.375 g 100 mL/hr over 30 Minutes Intravenous  Once 06/02/15 0055 06/02/15 0202      Assessment/Plan: s/p Procedure(s): LEFT TOTAL MASTECTOMY (Left)  Anemia multifactorial including surgical blood loss.  Continue wound VAC for now.  Probable eventual skin graft.  LOS: 1 day    Evola Hollis A 06/03/2015

## 2015-06-03 NOTE — Progress Notes (Signed)
Physician notified of critical lab value of Hbg 6.6 and ordered 1 unit PRBCs to be transfused, when I spoke with pt she refused any blood products. Pt educated however she stated that for her religious belief she refused to accept any blood products. Consent form for refusal of all blood products read aloud to pt and pt signed. Paged MD to make him aware.   Paulene Floor, RN 06/03/2015 4:14 AM

## 2015-06-03 NOTE — Progress Notes (Signed)
CRITICAL VALUE ALERT  Critical value received:  Gram positive rods in aerobic blood culture bottle  Date of notification:  06/03/2015  Time of notification:  0930  Critical value read back:Yes.    Nurse who received alert:  Lesli Albee  MD notified (1st page):  Dr. Verlon Au

## 2015-06-03 NOTE — Progress Notes (Addendum)
PATIENT ID: 70 year old homeless female here with newly-diagnosed breast cancer now s/p left mastectomy.  Noted to have a vegetation on her ascending aorta on CT scan.  INTERVAL HISTORY: Mastectomy yesterday.  Patient refuses blood transfusion for religious reasons.  SUBJECTIVE:  Ms. Fingerhut is doing well this morning. She denies any pain. She states that she will not accept blood transfusions because she "read it in the bible somewhere."  She is not a Jehovah's Witness.   PHYSICAL EXAM Filed Vitals:   06/03/15 0418 06/03/15 0500 06/03/15 0600 06/03/15 0700  BP:      Pulse:  60 58 52  Temp: 97.8 F (36.6 C)     TempSrc: Oral     Resp:  10 9 9   Height:      Weight:      SpO2:  100% 100% 100%   Gen: No acute distress. Frail. Neck: No JVD. COR: RRR. No m/r/g. Normal S1/S2.  Lungs: CTAB.  No wheezes, rhonchi or rales. Abd: Soft, NT, ND. +BS Ext: WWP. No edmea. 2+ DP/TP bilaterally.   LABS: No results found for: TROPONINI Results for orders placed or performed during the hospital encounter of 06/02/15 (from the past 24 hour(s))  Comprehensive metabolic panel     Status: Abnormal   Collection Time: 06/03/15  2:37 AM  Result Value Ref Range   Sodium 136 135 - 145 mmol/L   Potassium 4.0 3.5 - 5.1 mmol/L   Chloride 108 101 - 111 mmol/L   CO2 18 (L) 22 - 32 mmol/L   Glucose, Bld 183 (H) 65 - 99 mg/dL   BUN 13 6 - 20 mg/dL   Creatinine, Ser 1.19 (H) 0.44 - 1.00 mg/dL   Calcium 7.9 (L) 8.9 - 10.3 mg/dL   Total Protein 5.1 (L) 6.5 - 8.1 g/dL   Albumin 1.6 (L) 3.5 - 5.0 g/dL   AST 15 15 - 41 U/L   ALT 15 14 - 54 U/L   Alkaline Phosphatase 63 38 - 126 U/L   Total Bilirubin 0.5 0.3 - 1.2 mg/dL   GFR calc non Af Amer 46 (L) >60 mL/min   GFR calc Af Amer 54 (L) >60 mL/min   Anion gap 10 5 - 15  CBC     Status: Abnormal   Collection Time: 06/03/15  2:37 AM  Result Value Ref Range   WBC 12.2 (H) 4.0 - 10.5 K/uL   RBC 2.72 (L) 3.87 - 5.11 MIL/uL   Hemoglobin 6.6 (LL)  12.0 - 15.0 g/dL   HCT 20.7 (L) 36.0 - 46.0 %   MCV 76.1 (L) 78.0 - 100.0 fL   MCH 24.3 (L) 26.0 - 34.0 pg   MCHC 31.9 30.0 - 36.0 g/dL   RDW 14.2 11.5 - 15.5 %   Platelets 399 150 - 400 K/uL  Protime-INR     Status: None   Collection Time: 06/03/15  2:37 AM  Result Value Ref Range   Prothrombin Time 15.2 11.6 - 15.2 seconds   INR 1.18 0.00 - 1.49  Prepare RBC     Status: None   Collection Time: 06/03/15  3:56 AM  Result Value Ref Range   Order Confirmation ORDER PROCESSED BY BLOOD BANK     Intake/Output Summary (Last 24 hours) at 06/03/15 0748 Last data filed at 06/03/15 0700  Gross per 24 hour  Intake   4950 ml  Output    650 ml  Net   4300 ml    EKG:  pending  Telemetry: Bradycardia to the 40s. Otherwise sinus rhythm and occasional episodes of atrial tachycardia.  ASSESSMENT AND PLAN:  Principal Problem:   Infection of left breast Active Problems:   Left breast mass   Breast cancer ([place])   AKI (acute kidney injury) ([place])   Breast infection   Homeless   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HCC)   # Endovascular infection:  Ms. Franssen is receiving IV antibiotics through the guidance of the infectious disease team. Her renal function has improved significantly. Would recommend getting a CT angiogram the chest when stable to better evaluate her aorta.  Blood cultures no growth to date.  # Possible endocarditis: Given the infection on her aorta, it is concerning that there may also be infection on her heart valves. Transthoracic echo was not possible given her wound VAC. Will plan for TEE on Monday.  Daily EKGs to monitor her PR interval until endocarditis is ruled out.   # Acute on chronic anemia:  patient refusing blood transfusion post surgery. She cites religious reasons but is not a Restaurant manager, fast food. She is amenable to other procedures.   Will consider iron supplementation.   # Bradycardia:  Patient is asymptomatic.  Will continue to monitor for now  [place]ut[place]. Oval Linsey, MD,  [facility] 06/03/2015 7:48 AM

## 2015-06-04 DIAGNOSIS — Z590 Homelessness: Secondary | ICD-10-CM

## 2015-06-04 DIAGNOSIS — C50011 Malignant neoplasm of nipple and areola, right female breast: Secondary | ICD-10-CM

## 2015-06-04 DIAGNOSIS — I779 Disorder of arteries and arterioles, unspecified: Secondary | ICD-10-CM

## 2015-06-04 DIAGNOSIS — C50012 Malignant neoplasm of nipple and areola, left female breast: Secondary | ICD-10-CM

## 2015-06-04 LAB — GLUCOSE, CAPILLARY: Glucose-Capillary: 109 mg/dL — ABNORMAL HIGH (ref 65–99)

## 2015-06-04 LAB — HEPATITIS C ANTIBODY

## 2015-06-04 NOTE — Progress Notes (Addendum)
PATIENT ID: 70 year old homeless female here with newly-diagnosed breast cancer now s/p left mastectomy.  Noted to have a vegetation on her ascending aorta on CT scan.  INTERVAL HISTORY: Robin Cantrell had a chest CT-A yesterday.  A 1.4 cm lesion with a "stalk like attachment" to the aorta was noted.  It is unclear whether it is neoplastic vs. Infectious.  Adventitial thickening throughout the aorta was also noted.  No metastases were noted.   SUBJECTIVE:  Feels well. No complaints   PHYSICAL EXAM Filed Vitals:   06/03/15 2310 06/03/15 2321 06/04/15 0329 06/04/15 0753  BP:  120/64 125/64 158/80  Pulse:  65    Temp:  98.1 F (36.7 C) 98 F (36.7 C) 98.6 F (37 C)  TempSrc: Oral Oral Oral Oral  Resp:  18 11 18   Height:      Weight:      SpO2:  100% 98% 100%   Gen: No acute distress. Frail. Neck: No JVD. COR: RRR. No m/r/g. Normal S1/S2.  Lungs: CTAB.  No wheezes, rhonchi or rales. Chest: Wound VAC over left chest wall Abd: Soft, NT, ND. +BS Ext: WWP. No edmea. 2+ DP/TP bilaterally.   LABS: No results found for: TROPONINI Results for orders placed or performed during the hospital encounter of 06/02/15 (from the past 24 hour(s))  Reticulocytes     Status: Abnormal   Collection Time: 06/03/15  9:40 AM  Result Value Ref Range   Retic Ct Pct 1.0 0.4 - 3.1 %   RBC. 2.89 (L) 3.87 - 5.11 MIL/uL   Retic Count, Manual 28.9 19.0 - 186.0 K/uL  Ferritin     Status: None   Collection Time: 06/03/15 11:25 AM  Result Value Ref Range   Ferritin 55 11 - 307 ng/mL  Iron and TIBC     Status: Abnormal   Collection Time: 06/03/15 11:25 AM  Result Value Ref Range   Iron 6 (L) 28 - 170 ug/dL   TIBC 169 (L) 250 - 450 ug/dL   Saturation Ratios 4 (L) 10.4 - 31.8 %   UIBC 163 ug/dL  Vitamin B12     Status: None   Collection Time: 06/03/15 11:25 AM  Result Value Ref Range   Vitamin B-12 643 180 - 914 pg/mL    Intake/Output Summary (Last 24 hours) at 06/04/15 0810 Last data filed  at 06/04/15 0801  Gross per 24 hour  Intake 4620.01 ml  Output   2250 ml  Net 2370.01 ml    EKG: Sinus rhythm. 60 bpm.  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)   # Endovascular infection:  Robin Cantrell is receiving IV antibiotics through the guidance of the infectious disease team. Her renal function has improved significantly. CT scan shows a pedunculated lesion on the ascending aorta. It is unclear whether it is infection versus metastasis. Planning for TEE tomorrow. Her surgical options are limited with a hemoglobin of 6.6 and refusal to accept transfusion.  # Possible endocarditis: Given the possible infection on her aorta, it is concerning that there may also be infection on her  heart valves. Transthoracic echo would be limited given her wound VAC. Will plan for TEE tomorrow.  PR interval is stable.  # Acute on chronic anemia:  Patient refuses blood transfusion post surgery. She cites religious reasons but is not a Restaurant manager, fast food. She is amenable to other procedures.   Iron supplementation started 2/4.   # Bradycardia:  Resolved.  Robin Cantrell C. Oval Linsey, MD, [facility] 06/04/2015 8:10 AM

## 2015-06-04 NOTE — Progress Notes (Signed)
Subjective: No new complaints she states she is feeling much better   Antibiotics:  Anti-infectives    Start     Dose/Rate Route Frequency Ordered Stop   06/03/15 0000  vancomycin (VANCOCIN) IVPB 1000 mg/200 mL premix     1,000 mg 200 mL/hr over 60 Minutes Intravenous Every 24 hours 06/02/15 2350     06/02/15 2000  linezolid (ZYVOX) tablet 600 mg  Status:  Discontinued     600 mg Oral Every 12 hours 06/02/15 0454 06/02/15 2310   06/02/15 0600  linezolid (ZYVOX) IVPB 600 mg     600 mg 300 mL/hr over 60 Minutes Intravenous  Once 06/02/15 0454 06/02/15 0958   06/02/15 0600  Ampicillin-Sulbactam (UNASYN) 3 g in sodium chloride 0.9 % 100 mL IVPB    Comments:  Unasyn 3 g IV q6h for CrCl > 30 mL/min   3 g 100 mL/hr over 60 Minutes Intravenous Every 6 hours 06/02/15 0458     06/02/15 0100  vancomycin (VANCOCIN) 1,500 mg in sodium chloride 0.9 % 500 mL IVPB     1,500 mg 250 mL/hr over 120 Minutes Intravenous  Once 06/02/15 0055 06/02/15 0502   06/02/15 0100  piperacillin-tazobactam (ZOSYN) IVPB 3.375 g     3.375 g 100 mL/hr over 30 Minutes Intravenous  Once 06/02/15 0055 06/02/15 0202      Medications: Scheduled Meds: . sodium chloride   Intravenous Once  . ampicillin-sulbactam (UNASYN) IV  3 g Intravenous Q6H  . sodium chloride flush  3 mL Intravenous Q12H  . vancomycin  1,000 mg Intravenous Q24H   Continuous Infusions: . sodium chloride 100 mL/hr at 06/04/15 0700   PRN Meds:.acetaminophen **OR** acetaminophen, alum & mag hydroxide-simeth, HYDROcodone-acetaminophen, morphine injection, ondansetron **OR** ondansetron (ZOFRAN) IV, oxyCODONE-acetaminophen    Objective: Weight change:   Intake/Output Summary (Last 24 hours) at 06/04/15 1428 Last data filed at 06/04/15 1400  Gross per 24 hour  Intake 3643.01 ml  Output   2600 ml  Net 1043.01 ml   Blood pressure 144/75, pulse 65, temperature 98.5 F (36.9 C), temperature source Oral, resp. rate 10, height 5\' 8"   (1.727 m), weight 139 lb 12.4 oz (63.4 kg), SpO2 100 %. Temp:  [98 F (36.7 C)-98.6 F (37 C)] 98.5 F (36.9 C) (02/05 1249) Pulse Rate:  [52-65] 65 (02/04 2321) Resp:  [7-18] 10 (02/05 1249) BP: (109-158)/(64-80) 144/75 mmHg (02/05 1249) SpO2:  [98 %-100 %] 100 % (02/05 1249)  Physical Exam: General: Alert and awake, oriented x3, not in any acute distress. HEENT: anicteric sclera,EOMI CVS regular rate, normal r,  no murmur rubs or gallops Chest: clear to auscultation bilaterally, no wheezing, rales or rhonchi  Abdomen: soft nontender, nondistended, normal bowel sounds, Extremities: no  clubbing or edema noted bilaterally Skin: left chest with dressing in place vaccum Neuro: nonfocal  CBC:  CBC Latest Ref Rng 06/03/2015 06/02/2015 06/02/2015  WBC 4.0 - 10.5 K/uL 12.2(H) 14.2(H) 17.9(H)  Hemoglobin 12.0 - 15.0 g/dL 6.6(LL) 8.1(L) 9.3(L)  Hematocrit 36.0 - 46.0 % 20.7(L) 25.0(L) 28.8(L)  Platelets 150 - 400 K/uL 399 512(H) 553(H)      BMET  Recent Labs  06/02/15 0540 06/03/15 0237  NA 137 136  K 3.3* 4.0  CL 107 108  CO2 20* 18*  GLUCOSE 113* 183*  BUN 25* 13  CREATININE 1.45* 1.19*  CALCIUM 7.5* 7.9*     Liver Panel   Recent Labs  06/02/15 0540 06/03/15 0237  PROT 5.3*  5.1*  ALBUMIN 1.6* 1.6*  AST 14* 15  ALT 17 15  ALKPHOS 103 63  BILITOT 0.5 0.5       Sedimentation Rate No results for input(s): ESRSEDRATE in the last 72 hours. C-Reactive Protein No results for input(s): CRP in the last 72 hours.  Micro Results: Recent Results (from the past 720 hour(s))  Culture, blood (Routine X 2) w Reflex to ID Panel     Status: None (Preliminary result)   Collection Time: 06/02/15  1:10 AM  Result Value Ref Range Status   Specimen Description BLOOD RIGHT ARM  Final   Special Requests BOTTLES DRAWN AEROBIC AND ANAEROBIC 10ML  Final   Culture  Setup Time   Final    GRAM POSITIVE RODS AEROBIC BOTTLE ONLY CRITICAL RESULT CALLED TO, READ BACK BY AND VERIFIED  WITH: Youlanda Roys AT [id number] 06/03/15 BY L BENFIELD    Culture   Final    DIPHTHEROIDS(CORYNEBACTERIUM SPECIES) Standardized susceptibility testing for this organism is not available.    Report Status PENDING  Incomplete  Culture, blood (Routine X 2) w Reflex to ID Panel     Status: None (Preliminary result)   Collection Time: 06/02/15  1:15 AM  Result Value Ref Range Status   Specimen Description BLOOD RIGHT HAND  Final   Special Requests AEROBIC BOTTLE ONLY 5ML  Final   Culture NO GROWTH 1 DAY  Final   Report Status PENDING  Incomplete  Surgical pcr screen     Status: None   Collection Time: 06/02/15  4:35 AM  Result Value Ref Range Status   MRSA, PCR NEGATIVE NEGATIVE Final   Staphylococcus aureus NEGATIVE NEGATIVE Final    Comment:        The Xpert SA Assay (FDA approved for NASAL specimens in patients over 72 years of age), is one component of a comprehensive surveillance program.  Test performance has been validated by [facility] for patients greater than or equal to 24 year old. It is not intended to diagnose infection nor to guide or monitor treatment.     Studies/Results: Ct Head W Wo Contrast  06/03/2015  CLINICAL DATA:  70 year old female with newly diagnosed breast cancer status post mastectomy yesterday. Subsequent encounter. EXAM: CT HEAD WITHOUT AND WITH CONTRAST TECHNIQUE: Contiguous axial images were obtained from the base of the skull through the vertex without and with intravenous contrast CONTRAST:  89mL OMNIPAQUE IOHEXOL 350 MG/ML SOLN COMPARISON:  Chest CT 06/02/2015. FINDINGS: Mild right mastoid effusion. Negative visible nasopharynx. Other Visualized paranasal sinuses and mastoids are clear. No acute or suspicious osseous lesion identified in the visible skull. Visualized scalp soft tissues are within normal limits. Along the right lateral hemisphere there is a 3.3 cm length 1.4 cm with peripheral, dural based appearing hyperdense lesion with enhancement at the  level of the operculum (series 4, image 15). Despite this lesions size, there is no definite associated edema. There is patchy and confluent bilateral cerebral white matter hypodensity which might in part be small vessel related, however, following contrast there are multiple subcentimeter round enhancing lesions (most about 7 mm diameter) identified in the right frontal lobe (series 6, images 13 and 14), left frontal lobe, and posterior left temporal lobe. The largest of these lesions is a 14 mm left operculum metastasis on series 6, image 17. Associated vasogenic edema but no significant mass effect. In all 6 such lesions are identified. Despite these findings there is no intracranial mass effect. Basilar cisterns remain patent. No  chronic cortically based infarct identified. No ventriculomegaly. No acute intracranial hemorrhage identified. Major intracranial vascular structures are enhancing. IMPRESSION: 1. Positive for metastatic disease to the brain. At least 6 round enhancing brain masses in both hemispheres, most are small - about 7 mm in size while the largest is 14 mm. Some vasogenic edema but no significant intracranial mass effect. 2. Superimposed moderate-sized 3 cm right operculum extra-axial appearing hyperdense an enhancing mass. Although dural based metastasis is possible this may instead reflect a superimposed benign meningioma. 3. Follow-up brain MRI without and with contrast may clarify the diagnosis of #2 and detect additional small brain metastases. Electronically Signed   By: Genevie Ann M.D.   On: 06/03/2015 13:41   Ct Abdomen Pelvis W Contrast  06/03/2015  CLINICAL DATA:  Patient status post LEFT mastectomy for metastatic breast cancer. Patient did lesion discovered on CT exam from 06/02/2015. EXAM: CT ANGIOGRAPHY CHEST CT ABDOMEN AND PELVIS WITH CONTRAST TECHNIQUE: Multidetector CT imaging of the chest was performed using the standard protocol during bolus administration of intravenous  contrast. Multiplanar CT image reconstructions and MIPs were obtained to evaluate the vascular anatomy. Multidetector CT imaging of the abdomen and pelvis was performed using the standard protocol during bolus administration of intravenous contrast. CONTRAST:  80 mL Omnipaque COMPARISON:  CT 06/02/2015. FINDINGS: CTA CHEST FINDINGS Mediastinum/Nodes: Again demonstrated a irregular mass within the ascending aorta with a stalk like attachment to the aortic wall seen best on image 72, series 12 sagittal series. At its attachment the lesion puckers the wall of the aorta. The lesion is difficult to measure but measure approximately 1.4 cm (image 60, series 8). There is abnormal thickening of the adventitia of the aorta of throughout the aorta. No additional lesions are present within the aorta. There is uniform thickening of the adventitia throughout aorta. Great vessels are normal. No pulmonary embolism. No pericardial fluid. The LEFT and RIGHT ventricle appear normal. Normal atria Lungs/Pleura: No pulmonary nodularity suggest metastasis. Small bilateral pleural effusions. Musculoskeletal: A postsurgical change in the LEFT chest wall consistent with mastectomy. CT ABDOMEN and PELVIS FINDINGS Hepatobiliary: Small hypodense lesion in the superior aspect of the LEFT hepatic lobe has low attenuation consistent with a cyst. No biliary duct dilatation. Gallbladder normal. Pancreas: Pancreas is normal. No ductal dilatation. No pancreatic inflammation. Spleen: Normal spleen Adrenals/urinary tract: There is bilateral nodular adrenal thickening. The lesion of concern on comparison CT is felt to represent a nodule enlargement of the medial limb of the RIGHT adrenal gland to 13 mm. . No renal obstruction. Ureters are normal. On the lateral projection there is suggestion hypodensity within the dome the bladder which is felt to represent streak artifact from adjacent contrast within the bowel. Stomach/Bowel: Stomach, small bowel,  appendix are normal. There is diffuse vessel thickening in the ascending colon without pericolonic inflammation. No mass lesion or obstruction. The LEFT colon and rectosigmoid colon are normal. Vascular/Lymphatic: Abdominal aorta is normal caliber. There is no retroperitoneal or periportal lymphadenopathy. No pelvic lymphadenopathy. Reproductive: Uterus and ovaries are normal. Other: No free fluid. Musculoskeletal: No aggressive osseous lesion. Review of the MIP images confirms the above findings. IMPRESSION: Chest Impression: 1. Pedunculated lesion within the ascending aorta with differential including infectious versus neoplastic etiology. Transesophageal echocardiography may provide further characterization. 2. Uniform adventitial thickening involving the entirety the thoracic aorta. 3. No evidence of pulmonary metastasis. 4. Postsurgical changes in the LEFT chest wall Abdomen / Pelvis Impression: 1. Small hypodense lesion in the liver likely represents benign  cyst. 2. Nodular enlargement of the RIGHT adrenal gland is indeterminate. Consider FDG PET scan for complete staging. 3. No metastatic disease identified elsewhere in the abdomen or pelvis. 4. Mucosal thickening in the ascending colon is nonspecific but suggests segmental colitis. Electronically Signed   By: Suzy Bouchard M.D.   On: 06/03/2015 13:57   Ct Angio Chest Aorta W/cm &/or Wo/cm  06/03/2015  CLINICAL DATA:  Patient status post LEFT mastectomy for metastatic breast cancer. Patient did lesion discovered on CT exam from 06/02/2015. EXAM: CT ANGIOGRAPHY CHEST CT ABDOMEN AND PELVIS WITH CONTRAST TECHNIQUE: Multidetector CT imaging of the chest was performed using the standard protocol during bolus administration of intravenous contrast. Multiplanar CT image reconstructions and MIPs were obtained to evaluate the vascular anatomy. Multidetector CT imaging of the abdomen and pelvis was performed using the standard protocol during bolus administration  of intravenous contrast. CONTRAST:  80 mL Omnipaque COMPARISON:  CT 06/02/2015. FINDINGS: CTA CHEST FINDINGS Mediastinum/Nodes: Again demonstrated a irregular mass within the ascending aorta with a stalk like attachment to the aortic wall seen best on image 72, series 12 sagittal series. At its attachment the lesion puckers the wall of the aorta. The lesion is difficult to measure but measure approximately 1.4 cm (image 60, series 8). There is abnormal thickening of the adventitia of the aorta of throughout the aorta. No additional lesions are present within the aorta. There is uniform thickening of the adventitia throughout aorta. Great vessels are normal. No pulmonary embolism. No pericardial fluid. The LEFT and RIGHT ventricle appear normal. Normal atria Lungs/Pleura: No pulmonary nodularity suggest metastasis. Small bilateral pleural effusions. Musculoskeletal: A postsurgical change in the LEFT chest wall consistent with mastectomy. CT ABDOMEN and PELVIS FINDINGS Hepatobiliary: Small hypodense lesion in the superior aspect of the LEFT hepatic lobe has low attenuation consistent with a cyst. No biliary duct dilatation. Gallbladder normal. Pancreas: Pancreas is normal. No ductal dilatation. No pancreatic inflammation. Spleen: Normal spleen Adrenals/urinary tract: There is bilateral nodular adrenal thickening. The lesion of concern on comparison CT is felt to represent a nodule enlargement of the medial limb of the RIGHT adrenal gland to 13 mm. . No renal obstruction. Ureters are normal. On the lateral projection there is suggestion hypodensity within the dome the bladder which is felt to represent streak artifact from adjacent contrast within the bowel. Stomach/Bowel: Stomach, small bowel, appendix are normal. There is diffuse vessel thickening in the ascending colon without pericolonic inflammation. No mass lesion or obstruction. The LEFT colon and rectosigmoid colon are normal. Vascular/Lymphatic: Abdominal  aorta is normal caliber. There is no retroperitoneal or periportal lymphadenopathy. No pelvic lymphadenopathy. Reproductive: Uterus and ovaries are normal. Other: No free fluid. Musculoskeletal: No aggressive osseous lesion. Review of the MIP images confirms the above findings. IMPRESSION: Chest Impression: 1. Pedunculated lesion within the ascending aorta with differential including infectious versus neoplastic etiology. Transesophageal echocardiography may provide further characterization. 2. Uniform adventitial thickening involving the entirety the thoracic aorta. 3. No evidence of pulmonary metastasis. 4. Postsurgical changes in the LEFT chest wall Abdomen / Pelvis Impression: 1. Small hypodense lesion in the liver likely represents benign cyst. 2. Nodular enlargement of the RIGHT adrenal gland is indeterminate. Consider FDG PET scan for complete staging. 3. No metastatic disease identified elsewhere in the abdomen or pelvis. 4. Mucosal thickening in the ascending colon is nonspecific but suggests segmental colitis. Electronically Signed   By: Suzy Bouchard M.D.   On: 06/03/2015 13:57      Assessment/Plan:  INTERVAL  HISTORY 06/02/15: pt had mastectomy with placement of wound vacuum 06/03/15: blood culture with GPR 1/2 (2nd one without report yet) 06/03/15: CTA  Shows: Pedunculated lesion within the ascending aorta with differential including infectious versus neoplastic etiology.   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 It is not at all clear that this is an infectious vegetation could easily be an noninfectious vegetation associated with her malignancy  For now continue vanco/unasyn  Dr. Baxter Flattery to see in am.   LOS: 2 days   Rhina Brackett Dam 06/04/2015, 2:28 PM

## 2015-06-04 NOTE — Progress Notes (Signed)
     [facility] has been requested to perform a transesophageal echocardiogram on Robin Cantrell  for aortic mass and possible endocarditis.  After careful review of history and examination, the risks and benefits of transesophageal echocardiogram have been explained including risks of esophageal damage, perforation (1:10,000 risk), bleeding, pharyngeal hematoma as well as other potential complications associated with conscious sedation including aspiration, arrhythmia, respiratory failure and death. Alternatives to treatment were discussed, questions were answered. Patient is willing to proceed.   Ziana Heyliger C. Oval Linsey, MD, [facility]  06/04/2015 10:19 AM

## 2015-06-04 NOTE — Progress Notes (Addendum)
Robin Cantrell [id number] DOB: Jul 10, 1945 DOA: 06/02/2015 PCP: No primary care provider on file.  Brief narrative: 70 y/o ? No prior PMH Homeless Admitted from [facility] ED 06/02/15 with fungating breast lesion ~ 5 mo duration CT chest showed Necrotic breast mass with asymmetric nodes and  ?2.3 cm [place] aortic wall veg + liver and adrenal masses  WBC 14, Hb 8.1 PLt 512 BUN /Creat 25/1.4 CO2 20  K 3.3  General surgery, oncology consulted and underwent salvage mastectomy of the left breast 06/02/15 and wound VAC placed  Past medical history-As per Problem list Chart reviewed as below- oncology  Consultants:   Oncology  Gen surgery  Procedures:    Antibiotics:  unasyn  linezolide  Zosyn  vancomycin   Subjective   Pain is moderate No nausea no vomiting no chest pain Seems clear mentally   Objective    Interim History:   Telemetry: Sinus/sinus tach   Objective: Filed Vitals:   06/03/15 2321 06/04/15 0329 06/04/15 0753 06/04/15 0800  BP: 120/64 125/64 158/80   Pulse: 65     Temp: 98.1 F (36.7 C) 98 F (36.7 C) 98.6 F (37 C)   TempSrc: Oral Oral Oral   Resp: 18 11 18 14   Height:      Weight:      SpO2: 100% 98% 100%     Intake/Output Summary (Last 24 hours) at 06/04/15 1238 Last data filed at 06/04/15 1000  Gross per 24 hour  Intake 3383.01 ml  Output   1925 ml  Net 1458.01 ml    Exam:  General: alert .   Cardiovascular: s1 s 2no m/r/g Respiratory: clear no added sound Abdomen:  Soft nt nd no rebound nor gaurding Skin Neuro intact  Data Reviewed: Basic Metabolic Panel:  Recent Labs Lab 06/02/15 0120 06/02/15 0540 06/03/15 0237  NA 136 137 136  K 4.6 3.3* 4.0  CL 103 107 108  CO2 19* 20* 18*  GLUCOSE 87 113* 183*  BUN 29* 25* 13  CREATININE 1.41* 1.45* 1.19*  CALCIUM 8.6* 7.5* 7.9*   Liver Function Tests:  Recent Labs Lab 06/02/15 0540 06/03/15 0237  AST 14* 15  ALT 17 15  ALKPHOS 103 63  BILITOT 0.5 0.5  PROT  5.3* 5.1*  ALBUMIN 1.6* 1.6*   No results for input(s): LIPASE, AMYLASE in the last 168 hours. No results for input(s): AMMONIA in the last 168 hours. CBC:  Recent Labs Lab 06/02/15 0120 06/02/15 0540 06/03/15 0237  WBC 17.9* 14.2* 12.2*  NEUTROABS 15.9*  --   --   HGB 9.3* 8.1* 6.6*  HCT 28.8* 25.0* 20.7*  MCV 75.0* 75.1* 76.1*  PLT 553* 512* 399   Cardiac Enzymes: No results for input(s): CKTOTAL, CKMB, CKMBINDEX, TROPONINI in the last 168 hours. BNP: Invalid input(s): POCBNP CBG:  Recent Labs Lab 06/02/15 0739 06/03/15 0753 06/04/15 0759  GLUCAP 133* 125* 109*    Recent Results (from the past 240 hour(s))  Culture, blood (Routine X 2) w Reflex to ID Panel     Status: None (Preliminary result)   Collection Time: 06/02/15  1:10 AM  Result Value Ref Range Status   Specimen Description BLOOD RIGHT ARM  Final   Special Requests BOTTLES DRAWN AEROBIC AND ANAEROBIC 10ML  Final   Culture  Setup Time   Final    GRAM POSITIVE RODS AEROBIC BOTTLE ONLY CRITICAL RESULT CALLED TO, READ BACK BY AND VERIFIED WITH: Youlanda Roys AT [id number] 06/03/15 BY L BENFIELD  Culture   Final    DIPHTHEROIDS(CORYNEBACTERIUM SPECIES) Standardized susceptibility testing for this organism is not available.    Report Status PENDING  Incomplete  Culture, blood (Routine X 2) w Reflex to ID Panel     Status: None (Preliminary result)   Collection Time: 06/02/15  1:15 AM  Result Value Ref Range Status   Specimen Description BLOOD RIGHT HAND  Final   Special Requests AEROBIC BOTTLE ONLY 5ML  Final   Culture NO GROWTH 1 DAY  Final   Report Status PENDING  Incomplete  Surgical pcr screen     Status: None   Collection Time: 06/02/15  4:35 AM  Result Value Ref Range Status   MRSA, PCR NEGATIVE NEGATIVE Final   Staphylococcus aureus NEGATIVE NEGATIVE Final    Comment:        The Xpert SA Assay (FDA approved for NASAL specimens in patients over 64 years of age), is one component of a comprehensive  surveillance program.  Test performance has been validated by [facility] for patients greater than or equal to 9 year old. It is not intended to diagnose infection nor to guide or monitor treatment.      Studies:              All Imaging reviewed and is as per above notation   Scheduled Meds: . sodium chloride   Intravenous Once  . ampicillin-sulbactam (UNASYN) IV  3 g Intravenous Q6H  . sodium chloride flush  3 mL Intravenous Q12H  . vancomycin  1,000 mg Intravenous Q24H   Continuous Infusions: . sodium chloride 100 mL/hr at 06/04/15 0700     Assessment/Plan:  1. Fungating mass of breast with widespread local status post salvage mastectomy.  Further management re: Cancer work-up to be directed by Oncology and general surgery-Imagine a biopsy will be needed-- CT head shows metastatic disease and therefore we will need further input from oncology. Plastic surgery will be consulted tomorrow 06/04/68 regarding plans for closure 2. Infected breast mass-continue for now Unasyn as well as vancomycin. One of 2 bottles growing gram-positive cocci in the blood which may be a contaminant. Defer to ID further plans-note patient not amenable to frequent blood draws therefore will be drawing CBC every other day 3. Symptomatic anemia, possible acute blood loss component-hemoglobin on admission 9.3-->6.6. Potentially related to surgery and also underlying poor production from either anemia of chronic disease as well as cancer status. We will draw labs in pediatric tubes and limit continued blood draws-get anemia panel although almost sure this is iron def. patient was given IV iron on 06/03/15 4. Ascending aorta 3cm Asc. aorta vegetation-? needs systemic AC in addition to long term Abx-appreciate ID input-suspect polymicrobial disease needing broad coverage.  Cardiology to evaluate to better characterize with ? TEE vs CTA chest  Follow HIV 5. AKI-Creatinine better trending 1.49-1.1.  continue IV saline  100 cc/hr.  monitor labs in the a.m. 6. Metabolic acidosis-No baseline.  Has presumed AKI-continue saline 100 cc/h.  start sodium bicarbonate in a.m. if further worsening of acidosis 7. Hypokalemia-? 2/2 to acidosis.  Monitor in am-this is resolving 8. Homelessness-Social worker to look into this 9. Bradycardia-seesm only at night abnd  Monitors are reassuring.  Keep on tele for now however   Patient seems stable for transfer to telemetry Expect will need multimodal management in terms of surgical care and closure of the flap, Oncology to comment on further options   Verneita Griffes, MD  [facility] Pager [phone]  06/04/2015, 12:38 PM    LOS: 2 days

## 2015-06-04 NOTE — Progress Notes (Signed)
Patient ID: Robin Cantrell, female   DOB: [birth year], 70 y.o.   MRN: [id number]  [facility]  POD#: 2  Subjective: Patient in bed, comfortable, no complaints.  Appreciative of care.  Objective: Vital signs in last 24 hours: Temp:  [98 F (36.7 C)-98.6 F (37 C)] 98.6 F (37 C) (02/05 0753) Pulse Rate:  [52-150] 65 (02/04 2321) Resp:  [7-18] 18 (02/05 0753) BP: (109-158)/(64-80) 158/80 mmHg (02/05 0753) SpO2:  [98 %-100 %] 100 % (02/05 0753) Last BM Date: 06/01/15  Intake/Output from previous day: 02/04 0701 - 02/05 0700 In: 4930 [P.O.:2120; I.V.:2100; IV Piggyback:710] Out: 1800 [Urine:1675; Drains:125] Intake/Output this shift: Total I/O In: 100 [I.V.:100] Out: 450 [Urine:450]  Physical Exam: HEENT - sclerae clear, mucous membranes moist Neck - soft Chest - soft, VAC in place with good seal; surrounding skin soft without erythema or induration  Lab Results:   Recent Labs  06/02/15 0540 06/03/15 0237  WBC 14.2* 12.2*  HGB 8.1* 6.6*  HCT 25.0* 20.7*  PLT 512* 399   BMET  Recent Labs  06/02/15 0540 06/03/15 0237  NA 137 136  K 3.3* 4.0  CL 107 108  CO2 20* 18*  GLUCOSE 113* 183*  BUN 25* 13  CREATININE 1.45* 1.19*  CALCIUM 7.5* 7.9*   PT/INR  Recent Labs  06/02/15 0540 06/03/15 0237  LABPROT 15.9* 15.2  INR 1.26 1.18   Comprehensive Metabolic Panel:    Component Value Date/Time   NA 136 06/03/2015 0237   NA 137 06/02/2015 0540   K 4.0 06/03/2015 0237   K 3.3* 06/02/2015 0540   CL 108 06/03/2015 0237   CL 107 06/02/2015 0540   CO2 18* 06/03/2015 0237   CO2 20* 06/02/2015 0540   BUN 13 06/03/2015 0237   BUN 25* 06/02/2015 0540   CREATININE 1.19* 06/03/2015 0237   CREATININE 1.45* 06/02/2015 0540   GLUCOSE 183* 06/03/2015 0237   GLUCOSE 113* 06/02/2015 0540   CALCIUM 7.9* 06/03/2015 0237   CALCIUM 7.5* 06/02/2015 0540   AST 15 06/03/2015 0237   AST 14* 06/02/2015 0540   ALT 15 06/03/2015 0237    ALT 17 06/02/2015 0540   ALKPHOS 63 06/03/2015 0237   ALKPHOS 103 06/02/2015 0540   BILITOT 0.5 06/03/2015 0237   BILITOT 0.5 06/02/2015 0540   PROT 5.1* 06/03/2015 0237   PROT 5.3* 06/02/2015 0540   ALBUMIN 1.6* 06/03/2015 0237   ALBUMIN 1.6* 06/02/2015 0540    Studies/Results: Ct Head W Wo Contrast  06/03/2015  CLINICAL DATA:  70 year old female with newly diagnosed breast cancer status post mastectomy yesterday. Subsequent encounter. EXAM: CT HEAD WITHOUT AND WITH CONTRAST TECHNIQUE: Contiguous axial images were obtained from the base of the skull through the vertex without and with intravenous contrast CONTRAST:  1mL OMNIPAQUE IOHEXOL 350 MG/ML SOLN COMPARISON:  Chest CT 06/02/2015. FINDINGS: Mild right mastoid effusion. Negative visible nasopharynx. Other Visualized paranasal sinuses and mastoids are clear. No acute or suspicious osseous lesion identified in the visible skull. Visualized scalp soft tissues are within normal limits. Along the right lateral hemisphere there is a 3.3 cm length 1.4 cm with peripheral, dural based appearing hyperdense lesion with enhancement at the level of the operculum (series 4, image 15). Despite this lesions size, there is no definite associated edema. There is patchy and confluent bilateral cerebral white matter hypodensity which might in part be small vessel related, however, following contrast there are multiple subcentimeter round enhancing lesions (most about 7 mm  diameter) identified in the right frontal lobe (series 6, images 13 and 14), left frontal lobe, and posterior left temporal lobe. The largest of these lesions is a 14 mm left operculum metastasis on series 6, image 17. Associated vasogenic edema but no significant mass effect. In all 6 such lesions are identified. Despite these findings there is no intracranial mass effect. Basilar cisterns remain patent. No chronic cortically based infarct identified. No ventriculomegaly. No acute intracranial  hemorrhage identified. Major intracranial vascular structures are enhancing. IMPRESSION: 1. Positive for metastatic disease to the brain. At least 6 round enhancing brain masses in both hemispheres, most are small - about 7 mm in size while the largest is 14 mm. Some vasogenic edema but no significant intracranial mass effect. 2. Superimposed moderate-sized 3 cm right operculum extra-axial appearing hyperdense an enhancing mass. Although dural based metastasis is possible this may instead reflect a superimposed benign meningioma. 3. Follow-up brain MRI without and with contrast may clarify the diagnosis of #2 and detect additional small brain metastases. Electronically Signed   By: Genevie Ann M.D.   On: 06/03/2015 13:41   Ct Abdomen Pelvis W Contrast  06/03/2015  CLINICAL DATA:  Patient status post LEFT mastectomy for metastatic breast cancer. Patient did lesion discovered on CT exam from 06/02/2015. EXAM: CT ANGIOGRAPHY CHEST CT ABDOMEN AND PELVIS WITH CONTRAST TECHNIQUE: Multidetector CT imaging of the chest was performed using the standard protocol during bolus administration of intravenous contrast. Multiplanar CT image reconstructions and MIPs were obtained to evaluate the vascular anatomy. Multidetector CT imaging of the abdomen and pelvis was performed using the standard protocol during bolus administration of intravenous contrast. CONTRAST:  80 mL Omnipaque COMPARISON:  CT 06/02/2015. FINDINGS: CTA CHEST FINDINGS Mediastinum/Nodes: Again demonstrated a irregular mass within the ascending aorta with a stalk like attachment to the aortic wall seen best on image 72, series 12 sagittal series. At its attachment the lesion puckers the wall of the aorta. The lesion is difficult to measure but measure approximately 1.4 cm (image 60, series 8). There is abnormal thickening of the adventitia of the aorta of throughout the aorta. No additional lesions are present within the aorta. There is uniform thickening of the  adventitia throughout aorta. Great vessels are normal. No pulmonary embolism. No pericardial fluid. The LEFT and RIGHT ventricle appear normal. Normal atria Lungs/Pleura: No pulmonary nodularity suggest metastasis. Small bilateral pleural effusions. Musculoskeletal: A postsurgical change in the LEFT chest wall consistent with mastectomy. CT ABDOMEN and PELVIS FINDINGS Hepatobiliary: Small hypodense lesion in the superior aspect of the LEFT hepatic lobe has low attenuation consistent with a cyst. No biliary duct dilatation. Gallbladder normal. Pancreas: Pancreas is normal. No ductal dilatation. No pancreatic inflammation. Spleen: Normal spleen Adrenals/urinary tract: There is bilateral nodular adrenal thickening. The lesion of concern on comparison CT is felt to represent a nodule enlargement of the medial limb of the RIGHT adrenal gland to 13 mm. . No renal obstruction. Ureters are normal. On the lateral projection there is suggestion hypodensity within the dome the bladder which is felt to represent streak artifact from adjacent contrast within the bowel. Stomach/Bowel: Stomach, small bowel, appendix are normal. There is diffuse vessel thickening in the ascending colon without pericolonic inflammation. No mass lesion or obstruction. The LEFT colon and rectosigmoid colon are normal. Vascular/Lymphatic: Abdominal aorta is normal caliber. There is no retroperitoneal or periportal lymphadenopathy. No pelvic lymphadenopathy. Reproductive: Uterus and ovaries are normal. Other: No free fluid. Musculoskeletal: No aggressive osseous lesion. Review of the  MIP images confirms the above findings. IMPRESSION: Chest Impression: 1. Pedunculated lesion within the ascending aorta with differential including infectious versus neoplastic etiology. Transesophageal echocardiography may provide further characterization. 2. Uniform adventitial thickening involving the entirety the thoracic aorta. 3. No evidence of pulmonary metastasis.  4. Postsurgical changes in the LEFT chest wall Abdomen / Pelvis Impression: 1. Small hypodense lesion in the liver likely represents benign cyst. 2. Nodular enlargement of the RIGHT adrenal gland is indeterminate. Consider FDG PET scan for complete staging. 3. No metastatic disease identified elsewhere in the abdomen or pelvis. 4. Mucosal thickening in the ascending colon is nonspecific but suggests segmental colitis. Electronically Signed   By: Suzy Bouchard M.D.   On: 06/03/2015 13:57   Ct Angio Chest Aorta W/cm &/or Wo/cm  06/03/2015  CLINICAL DATA:  Patient status post LEFT mastectomy for metastatic breast cancer. Patient did lesion discovered on CT exam from 06/02/2015. EXAM: CT ANGIOGRAPHY CHEST CT ABDOMEN AND PELVIS WITH CONTRAST TECHNIQUE: Multidetector CT imaging of the chest was performed using the standard protocol during bolus administration of intravenous contrast. Multiplanar CT image reconstructions and MIPs were obtained to evaluate the vascular anatomy. Multidetector CT imaging of the abdomen and pelvis was performed using the standard protocol during bolus administration of intravenous contrast. CONTRAST:  80 mL Omnipaque COMPARISON:  CT 06/02/2015. FINDINGS: CTA CHEST FINDINGS Mediastinum/Nodes: Again demonstrated a irregular mass within the ascending aorta with a stalk like attachment to the aortic wall seen best on image 72, series 12 sagittal series. At its attachment the lesion puckers the wall of the aorta. The lesion is difficult to measure but measure approximately 1.4 cm (image 60, series 8). There is abnormal thickening of the adventitia of the aorta of throughout the aorta. No additional lesions are present within the aorta. There is uniform thickening of the adventitia throughout aorta. Great vessels are normal. No pulmonary embolism. No pericardial fluid. The LEFT and RIGHT ventricle appear normal. Normal atria Lungs/Pleura: No pulmonary nodularity suggest metastasis. Small  bilateral pleural effusions. Musculoskeletal: A postsurgical change in the LEFT chest wall consistent with mastectomy. CT ABDOMEN and PELVIS FINDINGS Hepatobiliary: Small hypodense lesion in the superior aspect of the LEFT hepatic lobe has low attenuation consistent with a cyst. No biliary duct dilatation. Gallbladder normal. Pancreas: Pancreas is normal. No ductal dilatation. No pancreatic inflammation. Spleen: Normal spleen Adrenals/urinary tract: There is bilateral nodular adrenal thickening. The lesion of concern on comparison CT is felt to represent a nodule enlargement of the medial limb of the RIGHT adrenal gland to 13 mm. . No renal obstruction. Ureters are normal. On the lateral projection there is suggestion hypodensity within the dome the bladder which is felt to represent streak artifact from adjacent contrast within the bowel. Stomach/Bowel: Stomach, small bowel, appendix are normal. There is diffuse vessel thickening in the ascending colon without pericolonic inflammation. No mass lesion or obstruction. The LEFT colon and rectosigmoid colon are normal. Vascular/Lymphatic: Abdominal aorta is normal caliber. There is no retroperitoneal or periportal lymphadenopathy. No pelvic lymphadenopathy. Reproductive: Uterus and ovaries are normal. Other: No free fluid. Musculoskeletal: No aggressive osseous lesion. Review of the MIP images confirms the above findings. IMPRESSION: Chest Impression: 1. Pedunculated lesion within the ascending aorta with differential including infectious versus neoplastic etiology. Transesophageal echocardiography may provide further characterization. 2. Uniform adventitial thickening involving the entirety the thoracic aorta. 3. No evidence of pulmonary metastasis. 4. Postsurgical changes in the LEFT chest wall Abdomen / Pelvis Impression: 1. Small hypodense lesion in the liver  likely represents benign cyst. 2. Nodular enlargement of the RIGHT adrenal gland is indeterminate. Consider  FDG PET scan for complete staging. 3. No metastatic disease identified elsewhere in the abdomen or pelvis. 4. Mucosal thickening in the ascending colon is nonspecific but suggests segmental colitis. Electronically Signed   By: Suzy Bouchard M.D.   On: 06/03/2015 13:57    Assessment & Plans: Status post left mastectomy for large necrotic tumor  Path pending  VAC dressing in place  Will need plastic surgery consult this week for wound closure  Earnstine Regal, MD, [facility] Office: [place] 06/04/2015

## 2015-06-05 ENCOUNTER — Other Ambulatory Visit: Payer: Self-pay | Admitting: *Deleted

## 2015-06-05 ENCOUNTER — Encounter (HOSPITAL_COMMUNITY): Payer: Self-pay | Admitting: Plastic Surgery

## 2015-06-05 DIAGNOSIS — C7931 Secondary malignant neoplasm of brain: Secondary | ICD-10-CM

## 2015-06-05 DIAGNOSIS — C50912 Malignant neoplasm of unspecified site of left female breast: Secondary | ICD-10-CM

## 2015-06-05 DIAGNOSIS — D649 Anemia, unspecified: Secondary | ICD-10-CM

## 2015-06-05 DIAGNOSIS — Z9012 Acquired absence of left breast and nipple: Secondary | ICD-10-CM

## 2015-06-05 LAB — CULTURE, BLOOD (ROUTINE X 2)

## 2015-06-05 LAB — CBC WITH DIFFERENTIAL/PLATELET
Basophils Absolute: 0.1 10*3/uL (ref 0.0–0.1)
Basophils Relative: 1 %
EOS ABS: 0.2 10*3/uL (ref 0.0–0.7)
EOS PCT: 2 %
HCT: 19.4 % — ABNORMAL LOW (ref 36.0–46.0)
Hemoglobin: 6.2 g/dL — CL (ref 12.0–15.0)
LYMPHS ABS: 2.5 10*3/uL (ref 0.7–4.0)
LYMPHS PCT: 29 %
MCH: 24.3 pg — AB (ref 26.0–34.0)
MCHC: 32 g/dL (ref 30.0–36.0)
MCV: 76.1 fL — AB (ref 78.0–100.0)
MONO ABS: 0.6 10*3/uL (ref 0.1–1.0)
MONOS PCT: 7 %
Neutro Abs: 5.4 10*3/uL (ref 1.7–7.7)
Neutrophils Relative %: 62 %
PLATELETS: 439 10*3/uL — AB (ref 150–400)
RBC: 2.55 MIL/uL — ABNORMAL LOW (ref 3.87–5.11)
RDW: 14.4 % (ref 11.5–15.5)
WBC: 8.7 10*3/uL (ref 4.0–10.5)

## 2015-06-05 LAB — BASIC METABOLIC PANEL
Anion gap: 8 (ref 5–15)
BUN: 7 mg/dL (ref 6–20)
CHLORIDE: 109 mmol/L (ref 101–111)
CO2: 24 mmol/L (ref 22–32)
CREATININE: 0.98 mg/dL (ref 0.44–1.00)
Calcium: 7.5 mg/dL — ABNORMAL LOW (ref 8.9–10.3)
GFR calc Af Amer: >60 mL/min (ref >60)
GFR calc non Af Amer: 58 mL/min — ABNORMAL LOW (ref >60)
GLUCOSE: 85 mg/dL (ref 65–99)
POTASSIUM: 3.3 mmol/L — AB (ref 3.5–5.1)
SODIUM: 141 mmol/L (ref 135–145)

## 2015-06-05 LAB — FOLATE RBC
FOLATE, HEMOLYSATE: 215 ng/mL
Folate, RBC: 1108 ng/mL (ref >498)
Hematocrit: 19.4 % — ABNORMAL LOW (ref 34.0–46.6)

## 2015-06-05 LAB — GLUCOSE, CAPILLARY: Glucose-Capillary: 79 mg/dL (ref 65–99)

## 2015-06-05 LAB — SODIUM, URINE, RANDOM: SODIUM UR: 117 mmol/L

## 2015-06-05 LAB — CREATININE, URINE, RANDOM: CREATININE, URINE: 15.85 mg/dL

## 2015-06-05 MED ORDER — VANCOMYCIN HCL IN DEXTROSE 750-5 MG/150ML-% IV SOLN
750.0000 mg | Freq: Two times a day (BID) | INTRAVENOUS | Status: DC
  Administered 2015-06-05 – 2015-06-12 (×13): 750 mg via INTRAVENOUS
  Filled 2015-06-05 (×16): qty 150

## 2015-06-05 MED ORDER — POTASSIUM CHLORIDE CRYS ER 20 MEQ PO TBCR
20.0000 meq | EXTENDED_RELEASE_TABLET | Freq: Every day | ORAL | Status: DC
  Administered 2015-06-05: 20 meq via ORAL
  Filled 2015-06-05: qty 1

## 2015-06-05 MED ORDER — POTASSIUM CHLORIDE CRYS ER 20 MEQ PO TBCR
20.0000 meq | EXTENDED_RELEASE_TABLET | Freq: Once | ORAL | Status: AC
  Administered 2015-06-05: 20 meq via ORAL
  Filled 2015-06-05: qty 1

## 2015-06-05 NOTE — Consult Note (Addendum)
Reason for Consult: Breast wound Referring Physician: Dr. Verita Lamb  Robin Cantrell is an 70 y.o. female.  HPI: The patient is a 70 yrs old bf here for treatment of her left chest mass / wound.  She presented to the ED with a large open wound on her left breast / chest.  She was taken to the OR by Dr. Georgette Dover for resection.  Path is pending.  Cultures positive for infection.  She states that she is homeless.  She has two sons who live in [place] and are coming to see her this week.  She had an ankle fracture several years ago and states she is otherwise healthy.  The lesion started on her left breast as a small lump a few months ago.  She thought it was a boil.  It quickly got unmanageable. Nothing made it better and between the smell, the drainage and the pain she decided to seek care.  The chart was reviewed the pictures reviewed.  This clear consumed the majority of her left breast.  History reviewed. No pertinent past medical history.  Past Surgical History  Procedure Laterality Date  . Ankle fracture surgery      History reviewed. No pertinent family history.  Social History:  reports that she quit smoking about 22 years ago. Her smoking use included Cigarettes. She does not have any smokeless tobacco history on file. She reports that she does not drink alcohol or use illicit drugs.  Allergies: No Known Allergies  Medications: I have reviewed the patient's current medications.  Results for orders placed or performed during the hospital encounter of 06/02/15 (from the past 48 hour(s))  Culture, blood (Routine X 2) w Reflex to ID Panel     Status: None (Preliminary result)   Collection Time: 06/03/15  3:00 PM  Result Value Ref Range   Specimen Description BLOOD RIGHT ANTECUBITAL    Special Requests IN PEDIATRIC BOTTLE 3CC    Culture NO GROWTH 1 DAY    Report Status PENDING   Culture, blood (Routine X 2) w Reflex to ID Panel     Status: None (Preliminary result)   Collection  Time: 06/03/15  3:05 PM  Result Value Ref Range   Specimen Description BLOOD BLOOD RIGHT HAND    Special Requests IN PEDIATRIC BOTTLE 3CC    Culture NO GROWTH 1 DAY    Report Status PENDING   Glucose, capillary     Status: Abnormal   Collection Time: 06/04/15  7:59 AM  Result Value Ref Range   Glucose-Capillary 109 (H) 65 - 99 mg/dL   Comment 1 Capillary Specimen   Basic metabolic panel     Status: Abnormal   Collection Time: 06/05/15  4:33 AM  Result Value Ref Range   Sodium 141 135 - 145 mmol/L   Potassium 3.3 (L) 3.5 - 5.1 mmol/L   Chloride 109 101 - 111 mmol/L   CO2 24 22 - 32 mmol/L   Glucose, Bld 85 65 - 99 mg/dL   BUN 7 6 - 20 mg/dL   Creatinine, Ser 0.98 0.44 - 1.00 mg/dL   Calcium 7.5 (L) 8.9 - 10.3 mg/dL   GFR calc non Af Amer 58 (L) >60 mL/min   GFR calc Af Amer >60 >60 mL/min    Comment: (NOTE) The eGFR has been calculated using the CKD EPI equation. This calculation has not been validated in all clinical situations. eGFR's persistently <60 mL/min signify possible Chronic Kidney Disease.    Anion gap 8  5 - 15  CBC with Differential/Platelet     Status: Abnormal   Collection Time: 06/05/15  4:33 AM  Result Value Ref Range   WBC 8.7 4.0 - 10.5 K/uL   RBC 2.55 (L) 3.87 - 5.11 MIL/uL   Hemoglobin 6.2 (LL) 12.0 - 15.0 g/dL    Comment: REPEATED TO VERIFY CRITICAL VALUE NOTED.  VALUE IS CONSISTENT WITH PREVIOUSLY REPORTED AND CALLED VALUE.    HCT 19.4 (L) 36.0 - 46.0 %   MCV 76.1 (L) 78.0 - 100.0 fL   MCH 24.3 (L) 26.0 - 34.0 pg   MCHC 32.0 30.0 - 36.0 g/dL   RDW 14.4 11.5 - 15.5 %   Platelets 439 (H) 150 - 400 K/uL   Neutrophils Relative % 62 %   Neutro Abs 5.4 1.7 - 7.7 K/uL   Lymphocytes Relative 29 %   Lymphs Abs 2.5 0.7 - 4.0 K/uL   Monocytes Relative 7 %   Monocytes Absolute 0.6 0.1 - 1.0 K/uL   Eosinophils Relative 2 %   Eosinophils Absolute 0.2 0.0 - 0.7 K/uL   Basophils Relative 1 %   Basophils Absolute 0.1 0.0 - 0.1 K/uL  Creatinine, urine,  random     Status: None   Collection Time: 06/05/15  5:00 AM  Result Value Ref Range   Creatinine, Urine 15.85 mg/dL  Sodium, urine, random     Status: None   Collection Time: 06/05/15  5:00 AM  Result Value Ref Range   Sodium, Ur 117 mmol/L  Glucose, capillary     Status: None   Collection Time: 06/05/15  8:43 AM  Result Value Ref Range   Glucose-Capillary 79 65 - 99 mg/dL   Comment 1 Notify RN     Ct Head W Wo Contrast  06/03/2015  CLINICAL DATA:  70 year old female with newly diagnosed breast cancer status post mastectomy yesterday. Subsequent encounter. EXAM: CT HEAD WITHOUT AND WITH CONTRAST TECHNIQUE: Contiguous axial images were obtained from the base of the skull through the vertex without and with intravenous contrast CONTRAST:  67m OMNIPAQUE IOHEXOL 350 MG/ML SOLN COMPARISON:  Chest CT 06/02/2015. FINDINGS: Mild right mastoid effusion. Negative visible nasopharynx. Other Visualized paranasal sinuses and mastoids are clear. No acute or suspicious osseous lesion identified in the visible skull. Visualized scalp soft tissues are within normal limits. Along the right lateral hemisphere there is a 3.3 cm length 1.4 cm with peripheral, dural based appearing hyperdense lesion with enhancement at the level of the operculum (series 4, image 15). Despite this lesions size, there is no definite associated edema. There is patchy and confluent bilateral cerebral white matter hypodensity which might in part be small vessel related, however, following contrast there are multiple subcentimeter round enhancing lesions (most about 7 mm diameter) identified in the right frontal lobe (series 6, images 13 and 14), left frontal lobe, and posterior left temporal lobe. The largest of these lesions is a 14 mm left operculum metastasis on series 6, image 17. Associated vasogenic edema but no significant mass effect. In all 6 such lesions are identified. Despite these findings there is no intracranial mass effect.  Basilar cisterns remain patent. No chronic cortically based infarct identified. No ventriculomegaly. No acute intracranial hemorrhage identified. Major intracranial vascular structures are enhancing. IMPRESSION: 1. Positive for metastatic disease to the brain. At least 6 round enhancing brain masses in both hemispheres, most are small - about 7 mm in size while the largest is 14 mm. Some vasogenic edema but no significant intracranial  mass effect. 2. Superimposed moderate-sized 3 cm right operculum extra-axial appearing hyperdense an enhancing mass. Although dural based metastasis is possible this may instead reflect a superimposed benign meningioma. 3. Follow-up brain MRI without and with contrast may clarify the diagnosis of #2 and detect additional small brain metastases. Electronically Signed   By: Genevie Ann M.D.   On: 06/03/2015 13:41   Ct Abdomen Pelvis W Contrast  06/03/2015  CLINICAL DATA:  Patient status post LEFT mastectomy for metastatic breast cancer. Patient did lesion discovered on CT exam from 06/02/2015. EXAM: CT ANGIOGRAPHY CHEST CT ABDOMEN AND PELVIS WITH CONTRAST TECHNIQUE: Multidetector CT imaging of the chest was performed using the standard protocol during bolus administration of intravenous contrast. Multiplanar CT image reconstructions and MIPs were obtained to evaluate the vascular anatomy. Multidetector CT imaging of the abdomen and pelvis was performed using the standard protocol during bolus administration of intravenous contrast. CONTRAST:  80 mL Omnipaque COMPARISON:  CT 06/02/2015. FINDINGS: CTA CHEST FINDINGS Mediastinum/Nodes: Again demonstrated a irregular mass within the ascending aorta with a stalk like attachment to the aortic wall seen best on image 72, series 12 sagittal series. At its attachment the lesion puckers the wall of the aorta. The lesion is difficult to measure but measure approximately 1.4 cm (image 60, series 8). There is abnormal thickening of the adventitia of  the aorta of throughout the aorta. No additional lesions are present within the aorta. There is uniform thickening of the adventitia throughout aorta. Great vessels are normal. No pulmonary embolism. No pericardial fluid. The LEFT and RIGHT ventricle appear normal. Normal atria Lungs/Pleura: No pulmonary nodularity suggest metastasis. Small bilateral pleural effusions. Musculoskeletal: A postsurgical change in the LEFT chest wall consistent with mastectomy. CT ABDOMEN and PELVIS FINDINGS Hepatobiliary: Small hypodense lesion in the superior aspect of the LEFT hepatic lobe has low attenuation consistent with a cyst. No biliary duct dilatation. Gallbladder normal. Pancreas: Pancreas is normal. No ductal dilatation. No pancreatic inflammation. Spleen: Normal spleen Adrenals/urinary tract: There is bilateral nodular adrenal thickening. The lesion of concern on comparison CT is felt to represent a nodule enlargement of the medial limb of the RIGHT adrenal gland to 13 mm. . No renal obstruction. Ureters are normal. On the lateral projection there is suggestion hypodensity within the dome the bladder which is felt to represent streak artifact from adjacent contrast within the bowel. Stomach/Bowel: Stomach, small bowel, appendix are normal. There is diffuse vessel thickening in the ascending colon without pericolonic inflammation. No mass lesion or obstruction. The LEFT colon and rectosigmoid colon are normal. Vascular/Lymphatic: Abdominal aorta is normal caliber. There is no retroperitoneal or periportal lymphadenopathy. No pelvic lymphadenopathy. Reproductive: Uterus and ovaries are normal. Other: No free fluid. Musculoskeletal: No aggressive osseous lesion. Review of the MIP images confirms the above findings. IMPRESSION: Chest Impression: 1. Pedunculated lesion within the ascending aorta with differential including infectious versus neoplastic etiology. Transesophageal echocardiography may provide further  characterization. 2. Uniform adventitial thickening involving the entirety the thoracic aorta. 3. No evidence of pulmonary metastasis. 4. Postsurgical changes in the LEFT chest wall Abdomen / Pelvis Impression: 1. Small hypodense lesion in the liver likely represents benign cyst. 2. Nodular enlargement of the RIGHT adrenal gland is indeterminate. Consider FDG PET scan for complete staging. 3. No metastatic disease identified elsewhere in the abdomen or pelvis. 4. Mucosal thickening in the ascending colon is nonspecific but suggests segmental colitis. Electronically Signed   By: Suzy Bouchard M.D.   On: 06/03/2015 13:57   Ct  Angio Chest Aorta W/cm &/or Wo/cm  06/03/2015  CLINICAL DATA:  Patient status post LEFT mastectomy for metastatic breast cancer. Patient did lesion discovered on CT exam from 06/02/2015. EXAM: CT ANGIOGRAPHY CHEST CT ABDOMEN AND PELVIS WITH CONTRAST TECHNIQUE: Multidetector CT imaging of the chest was performed using the standard protocol during bolus administration of intravenous contrast. Multiplanar CT image reconstructions and MIPs were obtained to evaluate the vascular anatomy. Multidetector CT imaging of the abdomen and pelvis was performed using the standard protocol during bolus administration of intravenous contrast. CONTRAST:  80 mL Omnipaque COMPARISON:  CT 06/02/2015. FINDINGS: CTA CHEST FINDINGS Mediastinum/Nodes: Again demonstrated a irregular mass within the ascending aorta with a stalk like attachment to the aortic wall seen best on image 72, series 12 sagittal series. At its attachment the lesion puckers the wall of the aorta. The lesion is difficult to measure but measure approximately 1.4 cm (image 60, series 8). There is abnormal thickening of the adventitia of the aorta of throughout the aorta. No additional lesions are present within the aorta. There is uniform thickening of the adventitia throughout aorta. Great vessels are normal. No pulmonary embolism. No pericardial  fluid. The LEFT and RIGHT ventricle appear normal. Normal atria Lungs/Pleura: No pulmonary nodularity suggest metastasis. Small bilateral pleural effusions. Musculoskeletal: A postsurgical change in the LEFT chest wall consistent with mastectomy. CT ABDOMEN and PELVIS FINDINGS Hepatobiliary: Small hypodense lesion in the superior aspect of the LEFT hepatic lobe has low attenuation consistent with a cyst. No biliary duct dilatation. Gallbladder normal. Pancreas: Pancreas is normal. No ductal dilatation. No pancreatic inflammation. Spleen: Normal spleen Adrenals/urinary tract: There is bilateral nodular adrenal thickening. The lesion of concern on comparison CT is felt to represent a nodule enlargement of the medial limb of the RIGHT adrenal gland to 13 mm. . No renal obstruction. Ureters are normal. On the lateral projection there is suggestion hypodensity within the dome the bladder which is felt to represent streak artifact from adjacent contrast within the bowel. Stomach/Bowel: Stomach, small bowel, appendix are normal. There is diffuse vessel thickening in the ascending colon without pericolonic inflammation. No mass lesion or obstruction. The LEFT colon and rectosigmoid colon are normal. Vascular/Lymphatic: Abdominal aorta is normal caliber. There is no retroperitoneal or periportal lymphadenopathy. No pelvic lymphadenopathy. Reproductive: Uterus and ovaries are normal. Other: No free fluid. Musculoskeletal: No aggressive osseous lesion. Review of the MIP images confirms the above findings. IMPRESSION: Chest Impression: 1. Pedunculated lesion within the ascending aorta with differential including infectious versus neoplastic etiology. Transesophageal echocardiography may provide further characterization. 2. Uniform adventitial thickening involving the entirety the thoracic aorta. 3. No evidence of pulmonary metastasis. 4. Postsurgical changes in the LEFT chest wall Abdomen / Pelvis Impression: 1. Small hypodense  lesion in the liver likely represents benign cyst. 2. Nodular enlargement of the RIGHT adrenal gland is indeterminate. Consider FDG PET scan for complete staging. 3. No metastatic disease identified elsewhere in the abdomen or pelvis. 4. Mucosal thickening in the ascending colon is nonspecific but suggests segmental colitis. Electronically Signed   By: Suzy Bouchard M.D.   On: 06/03/2015 13:57    Review of Systems  Constitutional: Negative.   HENT: Negative.   Eyes: Negative.   Respiratory: Negative.   Cardiovascular: Negative.   Gastrointestinal: Negative.   Genitourinary: Negative.   Musculoskeletal: Negative.   Skin: Negative.   Neurological: Negative for seizures.  Psychiatric/Behavioral: Negative.    Blood pressure 170/76, pulse 65, temperature 98.2 F (36.8 C), temperature source Oral, resp.  rate 17, height 5' 8"  (1.727 m), weight 63.4 kg (139 lb 12.4 oz), SpO2 100 %. Physical Exam  Constitutional: She is oriented to person, place, and time. She appears well-developed.  HENT:  Head: Normocephalic and atraumatic.  Eyes: Conjunctivae and EOM are normal. Pupils are equal, round, and reactive to light.  Cardiovascular: Normal rate.   Respiratory: Effort normal.    Neurological: She is alert and oriented to person, place, and time.  Skin: Skin is warm.  Psychiatric: She has a normal mood and affect. Her behavior is normal. Thought content normal.    Assessment/Plan: Will discuss with primary team.  Will need coverage but durability needs will depend on treatment plan for the tumor, ie. Radiation.  Her options are a skin graft versus a latissimus flap.  Agree with VAC for now. Nutrition consult for maximizing nutritional status.  Start multivitamin, Vit C and Zinc.  Wallace Going 06/05/2015, 12:13 PM

## 2015-06-05 NOTE — Progress Notes (Signed)
Subjective: No new complaints. No fevers. Mild pain to chest wall when they changed wound vac today   Antibiotics:  Anti-infectives    Start     Dose/Rate Route Frequency Ordered Stop   06/05/15 1400  vancomycin (VANCOCIN) IVPB 750 mg/150 ml premix     750 mg 150 mL/hr over 60 Minutes Intravenous Every 12 hours 06/05/15 1258     06/03/15 0000  vancomycin (VANCOCIN) IVPB 1000 mg/200 mL premix  Status:  Discontinued     1,000 mg 200 mL/hr over 60 Minutes Intravenous Every 24 hours 06/02/15 2350 06/05/15 1258   06/02/15 2000  linezolid (ZYVOX) tablet 600 mg  Status:  Discontinued     600 mg Oral Every 12 hours 06/02/15 0454 06/02/15 2310   06/02/15 0600  linezolid (ZYVOX) IVPB 600 mg     600 mg 300 mL/hr over 60 Minutes Intravenous  Once 06/02/15 0454 06/02/15 0958   06/02/15 0600  Ampicillin-Sulbactam (UNASYN) 3 g in sodium chloride 0.9 % 100 mL IVPB    Comments:  Unasyn 3 g IV q6h for CrCl > 30 mL/min   3 g 100 mL/hr over 60 Minutes Intravenous Every 6 hours 06/02/15 0458     06/02/15 0100  vancomycin (VANCOCIN) 1,500 mg in sodium chloride 0.9 % 500 mL IVPB     1,500 mg 250 mL/hr over 120 Minutes Intravenous  Once 06/02/15 0055 06/02/15 0502   06/02/15 0100  piperacillin-tazobactam (ZOSYN) IVPB 3.375 g     3.375 g 100 mL/hr over 30 Minutes Intravenous  Once 06/02/15 0055 06/02/15 0202      Medications: Scheduled Meds: . sodium chloride   Intravenous Once  . ampicillin-sulbactam (UNASYN) IV  3 g Intravenous Q6H  . potassium chloride  20 mEq Oral Daily  . sodium chloride flush  3 mL Intravenous Q12H  . vancomycin  750 mg Intravenous Q12H    Objective: Weight change:   Intake/Output Summary (Last 24 hours) at 06/05/15 1750 Last data filed at 06/05/15 1717  Gross per 24 hour  Intake   3211 ml  Output   4025 ml  Net   -814 ml   Blood pressure 138/86, pulse 69, temperature 99.3 F (37.4 C), temperature source Oral, resp. rate 17, height 5\' 8"  (1.727 m),  weight 139 lb 12.4 oz (63.4 kg), SpO2 100 %. Temp:  [98.2 F (36.8 C)-99.3 F (37.4 C)] 99.3 F (37.4 C) (02/06 1417) Pulse Rate:  [65-69] 69 (02/06 1417) Resp:  [17] 17 (02/06 1417) BP: (138-170)/(76-86) 138/86 mmHg (02/06 1417) SpO2:  [100 %] 100 % (02/06 1417)  Physical Exam: General: Alert and awake, oriented x3, not in any acute distress. HEENT: anicteric sclera,EOMI CVS regular rate, normal r,  no murmur rubs or gallops Chest: clear to auscultation bilaterally, no wheezing, rales or rhonchi  Abdomen: soft nontender, nondistended, normal bowel sounds, Extremities: no  clubbing or edema noted bilaterally Skin: left chest with dressing in place vaccum Neuro: nonfocal  CBC:  CBC Latest Ref Rng 06/05/2015 06/03/2015 06/03/2015  WBC 4.0 - 10.5 K/uL 8.7 12.2(H) -  Hemoglobin 12.0 - 15.0 g/dL 6.2(LL) 6.6(LL) -  Hematocrit 36.0 - 46.0 % 19.4(L) 19.4(L) 20.7(L)  Platelets 150 - 400 K/uL 439(H) 399 -      BMET  Recent Labs  06/03/15 0237 06/05/15 0433  NA 136 141  K 4.0 3.3*  CL 108 109  CO2 18* 24  GLUCOSE 183* 85  BUN 13 7  CREATININE 1.19* 0.98  CALCIUM 7.9* 7.5*     Liver Panel   Recent Labs  06/03/15 0237  PROT 5.1*  ALBUMIN 1.6*  AST 15  ALT 15  ALKPHOS 63  BILITOT 0.5     Micro Results: Recent Results (from the past 720 hour(s))  Culture, blood (Routine X 2) w Reflex to ID Panel     Status: None   Collection Time: 06/02/15  1:10 AM  Result Value Ref Range Status   Specimen Description BLOOD RIGHT ARM  Final   Special Requests BOTTLES DRAWN AEROBIC AND ANAEROBIC 10ML  Final   Culture  Setup Time   Final    GRAM POSITIVE RODS AEROBIC BOTTLE ONLY CRITICAL RESULT CALLED TO, READ BACK BY AND VERIFIED WITH: Youlanda Roys AT [id number] 06/03/15 BY L BENFIELD    Culture   Final    DIPHTHEROIDS(CORYNEBACTERIUM SPECIES) Standardized susceptibility testing for this organism is not available.    Report Status 06/05/2015 FINAL  Final  Culture, blood (Routine X  2) w Reflex to ID Panel     Status: None (Preliminary result)   Collection Time: 06/02/15  1:15 AM  Result Value Ref Range Status   Specimen Description BLOOD RIGHT HAND  Final   Special Requests AEROBIC BOTTLE ONLY 5ML  Final   Culture NO GROWTH 3 DAYS  Final   Report Status PENDING  Incomplete  Surgical pcr screen     Status: None   Collection Time: 06/02/15  4:35 AM  Result Value Ref Range Status   MRSA, PCR NEGATIVE NEGATIVE Final   Staphylococcus aureus NEGATIVE NEGATIVE Final    Comment:        The Xpert SA Assay (FDA approved for NASAL specimens in patients over 56 years of age), is one component of a comprehensive surveillance program.  Test performance has been validated by [facility] for patients greater than or equal to 66 year old. It is not intended to diagnose infection nor to guide or monitor treatment.   Culture, blood (Routine X 2) w Reflex to ID Panel     Status: None (Preliminary result)   Collection Time: 06/03/15  3:00 PM  Result Value Ref Range Status   Specimen Description BLOOD RIGHT ANTECUBITAL  Final   Special Requests IN PEDIATRIC BOTTLE 3CC  Final   Culture NO GROWTH 2 DAYS  Final   Report Status PENDING  Incomplete  Culture, blood (Routine X 2) w Reflex to ID Panel     Status: None (Preliminary result)   Collection Time: 06/03/15  3:05 PM  Result Value Ref Range Status   Specimen Description BLOOD BLOOD RIGHT HAND  Final   Special Requests IN PEDIATRIC BOTTLE 3CC  Final   Culture NO GROWTH 2 DAYS  Final   Report Status PENDING  Incomplete   06/03/15: blood culture with GPR 1/2 - diptheroids  Studies/Results: No results found.  06/03/15: CTA  Shows: Pedunculated lesion within the ascending aorta with differential including infectious versus neoplastic etiology.    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 female with  Necrotic breast likely due to breast cancer  with superimposed infection also with vegetation in aortic arch  #1 Necrotic breast tissue:  await pathology Continue vancomycin and unasyn though the surgery should have taken care of any infection here  #2 Corynebacterium in 1/2 cultures equals contaminant  #3 Vegetation in aortic arch: agree with TEE. It is not at all clear that this is an infectious vegetation could easily be an noninfectious vegetation associated with her malignancy. Await for results of TEE tomorrow  For now continue vanco/unasyn     LOS: 3 days   Robin Cantrell 06/05/2015, 5:50 PM

## 2015-06-05 NOTE — Progress Notes (Signed)
Pharmacy Antibiotic Note  Robin Cantrell is a 70 y.o. female admitted on 06/02/2015.  She is s/p mastectomy of necrotic infected breast and has a vegetation on her aortic arch.  Unasyn day # 4 and vancomycin day # 4.  WBC down 14.2>>8.7. Afebrile.  Cfreat down to 0.98 and creat cl ~ 60 ml/min.  Plan: - increase vancomycin to 750 mg IV q12h - continue unasyn 3 gm IV q8h - f/u TEE results - f/u ID consult  Height: 5\' 8"  (172.7 cm) Weight: 139 lb 12.4 oz (63.4 kg) IBW/kg (Calculated) : 63.9  Temp (24hrs), Avg:98.6 F (37 C), Min:98.2 F (36.8 C), Max:99 F (37.2 C)   Recent Labs Lab 06/02/15 0120 06/02/15 0126 06/02/15 0540 06/03/15 0237 06/05/15 0433  WBC 17.9*  --  14.2* 12.2* 8.7  CREATININE 1.41*  --  1.45* 1.19* 0.98  LATICACIDVEN  --  1.43  --   --   --     Estimated Creatinine Clearance: 55.8 mL/min (by C-G formula based on Cr of 0.98).    No Known Allergies  Antimicrobials this admission:  2/3 Vanco>> 2/3 Unasyn>> 2/3 Zyvox>>2/3  Dose adjustments this admission: 2/6 change vanc from 1 gm q24 to 750 mg IV q12h  Microbiology results:  2/3 MRSA screen- neg 2/3 blood x2 - diptheroids (1/2) = contaminant 2/4 blood x2 ngtd 2/3 HIV - neg 2/4 Hep C Ab - neg   Thank you for allowing pharmacy to be a part of this patient's care.  Eudelia Bunch, Pharm.D. [id number] 06/05/2015 1:01 PM

## 2015-06-05 NOTE — Clinical Documentation Improvement (Signed)
Cardiology General Surgery Infectious Disease Oncology  Based on significant clinical findings of CT head 2/3, please document any associated diagnoses/conditions the patient has or may have.   Brain metastases with vasogenic edema  Other  Clinically Undetermined  Supporting Information: -- CT report 2/3: "Some vasogenic edema but no significant intracranial mass effect" and "Positive for metastatic disease to the brain".  Please exercise your independent, professional judgment when responding. A specific answer is not anticipated or expected.  Thank You, Ezekiel Ina RN [place] [phone]

## 2015-06-05 NOTE — Progress Notes (Addendum)
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  Pt s/p L radical mastectomy and wound vac placement on 06/02/15 and is thought to have mets (liver and adrenal masses), possibly to the brain.  Oncology following.      PT Comments    Pt was able to walk, albeit unsafely, with RW into the hallway.  I am not sure, given her current cognitive status that she could safely use a RW (without further training).  It may prove to be more of an obstacle than a helping aid.  I would like to try gait without the RW with either nothing or a cane next session to see if she is more stable with those options.  PT to continue to follow acutely.    Follow Up Recommendations  SNF     Equipment Recommendations  Rolling walker with 5" wheels    Recommendations for Other Services   NA     Precautions / Restrictions Precautions Precautions: Fall Precaution Comments: wound vac    Mobility  Bed Mobility Overal bed mobility: Needs Assistance Bed Mobility: Supine to Sit;Sit to Supine     Supine to sit: Supervision Sit to supine: Supervision   General bed mobility comments: supervision for safety.  Pt relying heavily on upper extremity support and HOB elevated.   Transfers Overall transfer level: Needs assistance Equipment used: Rolling walker (2 wheeled) Transfers: Sit to/from Stand Sit to Stand: Min guard         General transfer comment: Min guard assist for balance and stability during transitions. Verbal cues needed for safety and safe hand placement during transitions.   Ambulation/Gait Ambulation/Gait assistance: Min guard Ambulation Distance (Feet): 150 Feet Assistive device: Rolling walker (2 wheeled) Gait Pattern/deviations: Step-through  pattern;Shuffle Gait velocity: too fast to be safe Gait velocity interpretation: at or above normal speed for age/gender General Gait Details: Pt moving quickly and impulsively with RW.  Needs assist to maneuver RW around obstacles and cues for avoiding obstacles in the hallway.  Multimodal cues during gait to help her safely navigate the halls with RW.            Balance Overall balance assessment: Needs assistance Sitting-balance support: Feet supported;No upper extremity supported Sitting balance-Leahy Scale: Good     Standing balance support: Bilateral upper extremity supported;Single extremity supported;No upper extremity supported Standing balance-Leahy Scale: Poor                      Cognition Arousal/Alertness: Awake/alert Behavior During Therapy: WFL for tasks assessed/performed Overall Cognitive Status: Impaired/Different from baseline Area of Impairment: Attention;Memory;Following commands;Safety/judgement;Awareness;Problem solving   Current Attention Level: Sustained Memory: Decreased short-term memory Following Commands: Follows one step commands with increased time;Follows multi-step commands inconsistently Safety/Judgement: Decreased awareness of safety;Decreased awareness of deficits Awareness: Emergent Problem Solving: Difficulty sequencing;Requires verbal cues;Requires tactile cues General Comments: Pt tangental in her conversation, at times confused/paranoid "they were watching me" talking about before she came into the hospital.     Exercises General Exercises - Lower Extremity Long Arc Quad: AROM;Both;10 reps;Seated Hip ABduction/ADduction: AROM;Both;10 reps;Seated (adduction only against pillow for resistance) Hip Flexion/Marching: AROM;Both;10 reps;Seated Toe Raises: AROM;Both;10 reps;Seated Heel Raises: AROM;Both;10 reps;Seated        Pertinent Vitals/Pain Pain Assessment: No/denies pain  PT Goals (current goals can now be  found in the care plan section) Acute Rehab PT Goals Patient Stated Goal: to get better Progress towards PT goals: Progressing toward goals    Frequency  Min 2X/week    PT Plan Current plan remains appropriate       End of Session Equipment Utilized During Treatment: Gait belt Activity Tolerance: Patient tolerated treatment well Patient left: in bed;with call bell/phone within reach;with bed alarm set     Time: 1119-1145 PT Time Calculation (min) (ACUTE ONLY): 26 min  Charges:  $Gait Training: 8-22 mins $Therapeutic Exercise: 8-22 mins                      Ezri Landers B. [place], [place], DPT [phone]   06/05/2015, 6:07 PM

## 2015-06-05 NOTE — NC FL2 (Signed)
Robin Cantrell MEDICAID FL2 LEVEL OF CARE SCREENING TOOL     IDENTIFICATION  Patient Name: Robin Cantrell Birthdate: 1946/02/06 Sex: female Admission Date (Current Location): 06/02/2015  [facility] and [place] Number:  Herbalist and Address:  [facility] [place] [address], [place], [place] [address]      Provider Number: [id number]  Attending Physician Name and Address:  Nita Sells, MD  Relative Name and Phone Number:       Current Level of Care: Hospital Recommended Level of Care: [place] Prior Approval Number:    Date Approved/Denied:   PASRR Number:    Discharge Plan: SNF    Current Diagnoses: Patient Active Problem List   Diagnosis Date Noted  . 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 (Robin Cantrell)   . 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 Left breast mass 06/02/2015  . Breast cancer (Robin Cantrell) 06/02/2015  . AKI (acute kidney injury) ([place]) 06/02/2015  . Infection of left breast 06/02/2015  . Breast infection 06/02/2015  . Homeless 06/02/2015  . 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[facility])     Orientation RESPIRATION BLADDER Height & Weight     Time, Situation, Place, Self  Normal Continent Weight: 139 lb 12.4 oz (63.4 kg) Height:  5\' 8"  (172.7 cm)  BEHAVIORAL SYMPTOMS/MOOD NEUROLOGICAL BOWEL NUTRITION STATUS      Continent Diet (NPO)  AMBULATORY STATUS COMMUNICATION OF NEEDS Skin   Extensive Assist Verbally Normal                       Personal Care Assistance Level of Assistance  Bathing, Feeding, Dressing Bathing Assistance: Maximum assistance Feeding assistance: Independent Dressing Assistance: Maximum assistance     Functional Limitations Info  Sight, Hearing, Speech Sight Info: Adequate Hearing Info: Adequate Speech Info: Adequate    SPECIAL CARE FACTORS FREQUENCY  PT (By licensed PT), OT (By licensed OT)                    Contractures      Additional  Factors Info  Code Status Code Status Info: FULL             Current Medications (06/05/2015):  This is the current hospital active medication list Current Facility-Administered Medications  Medication Dose Route Frequency Provider Last Rate Last Dose  . 0.9 %  sodium chloride infusion   Intravenous Continuous Nita Sells, MD 50 mL/hr at 06/05/15 1648    . 0.9 %  sodium chloride infusion   Intravenous Once Dianne Dun, NP      . acetaminophen (TYLENOL) tablet 650 mg  650 mg Oral Q6H PRN Ivor Costa, MD       Or  . acetaminophen (TYLENOL) suppository 650 mg  650 mg Rectal Q6H PRN Ivor Costa, MD      . alum & mag hydroxide-simeth (MAALOX/MYLANTA) 200-200-20 MG/5ML suspension 30 mL  30 mL Oral Q6H PRN Ivor Costa, MD      . Ampicillin-Sulbactam (UNASYN) 3 g in sodium chloride 0.9 % 100 mL IVPB  3 g Intravenous Q6H Ivor Costa, MD   3 g at 06/05/15 1711  . morphine 2 MG/ML injection 2 mg  2 mg Intravenous Q4H PRN Ivor Costa, MD   2 mg at 06/05/15 1504  . ondansetron (ZOFRAN) tablet 4 mg  4 mg Oral Q6H PRN Ivor Costa, MD       Or  . ondansetron [facility])  injection 4 mg  4 mg Intravenous Q6H PRN Ivor Costa, MD      . oxyCODONE-acetaminophen (PERCOCET/ROXICET) 5-325 MG per tablet 1-2 tablet  1-2 tablet Oral Q4H PRN Erby Pian, NP   2 tablet at 06/03/15 1139  . potassium chloride SA (K-DUR,KLOR-CON) CR tablet 20 mEq  20 mEq Oral Daily Nita Sells, MD   20 mEq at 06/05/15 1723  . sodium chloride flush (NS) 0.9 % injection 3 mL  3 mL Intravenous Q12H Ivor Costa, MD   3 mL at 06/04/15 1000  . vancomycin (VANCOCIN) IVPB 750 mg/150 ml premix  750 mg Intravenous Q12H Eudelia Bunch, RPH   750 mg at 06/05/15 1412     Discharge Medications: Please see discharge summary for a list of discharge medications.  Relevant Imaging Results:  Relevant Lab Results:   Additional Information    Raymondo Band, LCSW

## 2015-06-05 NOTE — Progress Notes (Signed)
I have checked on the scheduling of the TEE. I have been informed that the TEE is scheduled for Tuesday, February 7.  Daryel November, MD

## 2015-06-05 NOTE — Progress Notes (Signed)
Robin Cantrell [id number] DOB: [birth year] DOA: 06/02/2015 PCP: No primary care provider on file.  Brief narrative: 70 y/o ? No prior PMH Homeless Admitted from [facility] ED 06/02/15 with fungating breast lesion ~ 5 mo duration CT chest showed Necrotic breast mass with asymmetric nodes and  ?2.3 cm [place] aortic wall veg + liver and adrenal masses  WBC 14, Hb 8.1 PLt 512 BUN /Creat 25/1.4 CO2 20  K 3.3  General surgery, oncology consulted and underwent salvage mastectomy of the left breast 06/02/15 and wound VAC placed  Past medical history-As per Problem list Chart reviewed as below- oncology  Consultants:   Oncology  Gen surgery  Procedures:  2/3 MRSA screen- neg 2/3 blood x2 - diptheroids (1/2) = contaminant 2/4 blood x2 ngtd 2/3 HIV - neg 2/4 Hep C Ab - neg   Antibiotics:  2/3 and stoppedlinezolide  2/3 Unasyn   2/3 vancomycin   Subjective   Fair No new issues tol diet Eating and drinking well No pain nor fever Seems to understand needs a lot of things done in hospital   Objective    Interim History:   Telemetry: Sinus/sinus tach   Objective: Filed Vitals:   06/04/15 1249 06/04/15 1740 06/05/15 0634 06/05/15 1417  BP: 144/75 152/78 170/76 138/86  Pulse:  66 65 69  Temp: 98.5 F (36.9 C) 99 F (37.2 C) 98.2 F (36.8 C) 99.3 F (37.4 C)  TempSrc: Oral Oral  Oral  Resp: 10 17 17 17   Height:      Weight:      SpO2: 100% 97% 100% 100%    Intake/Output Summary (Last 24 hours) at 06/05/15 1443 Last data filed at 06/05/15 1412  Gross per 24 hour  Intake   3531 ml  Output   4425 ml  Net   -894 ml    Exam:  General: alert .   Cardiovascular: s1 s 2no m/r/g Respiratory: clear no added sound Abdomen:  Soft nt nd no rebound nor gaurding Skin Neuro intact  Data Reviewed: Basic Metabolic Panel:  Recent Labs Lab 06/02/15 0120 06/02/15 0540 06/03/15 0237 06/05/15 0433  NA 136 137 136 141  K 4.6 3.3* 4.0 3.3*  CL 103 107 108 109    CO2 19* 20* 18* 24  GLUCOSE 87 113* 183* 85  BUN 29* 25* 13 7  CREATININE 1.41* 1.45* 1.19* 0.98  CALCIUM 8.6* 7.5* 7.9* 7.5*   Liver Function Tests:  Recent Labs Lab 06/02/15 0540 06/03/15 0237  AST 14* 15  ALT 17 15  ALKPHOS 103 63  BILITOT 0.5 0.5  PROT 5.3* 5.1*  ALBUMIN 1.6* 1.6*   No results for input(s): LIPASE, AMYLASE in the last 168 hours. No results for input(s): AMMONIA in the last 168 hours. CBC:  Recent Labs Lab 06/02/15 0120 06/02/15 0540 06/03/15 0237 06/03/15 1125 06/05/15 0433  WBC 17.9* 14.2* 12.2*  --  8.7  NEUTROABS 15.9*  --   --   --  5.4  HGB 9.3* 8.1* 6.6*  --  6.2*  HCT 28.8* 25.0* 20.7* 19.4* 19.4*  MCV 75.0* 75.1* 76.1*  --  76.1*  PLT 553* 512* 399  --  439*   Cardiac Enzymes: No results for input(s): CKTOTAL, CKMB, CKMBINDEX, TROPONINI in the last 168 hours. BNP: Invalid input(s): POCBNP CBG:  Recent Labs Lab 06/02/15 0739 06/03/15 0753 06/04/15 0759 06/05/15 0843  GLUCAP 133* 125* 109* 79    Recent Results (from the past 240 hour(s))  Culture, blood (Routine X  2) w Reflex to ID Panel     Status: None   Collection Time: 06/02/15  1:10 AM  Result Value Ref Range Status   Specimen Description BLOOD RIGHT ARM  Final   Special Requests BOTTLES DRAWN AEROBIC AND ANAEROBIC 10ML  Final   Culture  Setup Time   Final    GRAM POSITIVE RODS AEROBIC BOTTLE ONLY CRITICAL RESULT CALLED TO, READ BACK BY AND VERIFIED WITH: Youlanda Roys AT [id number] 06/03/15 BY L BENFIELD    Culture   Final    DIPHTHEROIDS(CORYNEBACTERIUM SPECIES) Standardized susceptibility testing for this organism is not available.    Report Status 06/05/2015 FINAL  Final  Culture, blood (Routine X 2) w Reflex to ID Panel     Status: None (Preliminary result)   Collection Time: 06/02/15  1:15 AM  Result Value Ref Range Status   Specimen Description BLOOD RIGHT HAND  Final   Special Requests AEROBIC BOTTLE ONLY 5ML  Final   Culture NO GROWTH 2 DAYS  Final   Report  Status PENDING  Incomplete  Surgical pcr screen     Status: None   Collection Time: 06/02/15  4:35 AM  Result Value Ref Range Status   MRSA, PCR NEGATIVE NEGATIVE Final   Staphylococcus aureus NEGATIVE NEGATIVE Final    Comment:        The Xpert SA Assay (FDA approved for NASAL specimens in patients over 41 years of age), is one component of a comprehensive surveillance program.  Test performance has been validated by [facility] for patients greater than or equal to 16 year old. It is not intended to diagnose infection nor to guide or monitor treatment.   Culture, blood (Routine X 2) w Reflex to ID Panel     Status: None (Preliminary result)   Collection Time: 06/03/15  3:00 PM  Result Value Ref Range Status   Specimen Description BLOOD RIGHT ANTECUBITAL  Final   Special Requests IN PEDIATRIC BOTTLE 3CC  Final   Culture NO GROWTH 1 DAY  Final   Report Status PENDING  Incomplete  Culture, blood (Routine X 2) w Reflex to ID Panel     Status: None (Preliminary result)   Collection Time: 06/03/15  3:05 PM  Result Value Ref Range Status   Specimen Description BLOOD BLOOD RIGHT HAND  Final   Special Requests IN PEDIATRIC BOTTLE 3CC  Final   Culture NO GROWTH 1 DAY  Final   Report Status PENDING  Incomplete     Studies:              All Imaging reviewed and is as per above notation   Scheduled Meds: . sodium chloride   Intravenous Once  . ampicillin-sulbactam (UNASYN) IV  3 g Intravenous Q6H  . sodium chloride flush  3 mL Intravenous Q12H  . vancomycin  750 mg Intravenous Q12H   Continuous Infusions: . sodium chloride 100 mL/hr at 06/05/15 0249     Assessment/Plan:  1. Fungating mass of breast with widespread local status post salvage mastectomy.  Further management re: Cancer work-up to be directed by Oncology and general surgery-Imagine a biopsy will be needed-- CT head shows metastatic disease and therefore we will need further input from oncology. Plastic surgery will  be consulted tomorrow 06/04/68 regarding plans for closure-appreciate input 2. Infected breast mass-continue for now Unasyn as well as vancomycin. One of 2 bottles growing gram-positive cocci in the blood which may be a contaminant. Defer to ID further plans-note patient not  amenable to blood transfusion therefore will be drawing CBC every other day 3. Hypokalemia-replace orally Kdur 20 4. Symptomatic anemia, possible acute blood loss component-hemoglobin on admission 9.3-->6.6-->6.2. Potentially related to surgery and also underlying poor production from either anemia of chronic disease as well as cancer status. We will draw labs in pediatric tubes and limit continued blood draws-get anemia panel although almost sure this is iron def. patient was given IV iron on 06/03/15.  Hemoccult stools.  We will consider aranesp but in a setting of breast cancer will need to ask Oncology opinion first 5. Ascending aorta 3cm Asc. aorta vegetation-? needs systemic AC in addition to long term Abx-appreciate ID input-suspect polymicrobial disease needing broad coverage.  TEE scheduled for 06/06/15.  HIV is neg  6. AKI-Creatinine better trending 1.49-1.1.  continue IV saline 100 cc/hr-->50 cc/hr 06/04/12 as eating and drinking.  monitor labs in the a.m. 7. Metabolic acidosis-No baseline.  Has resolved 8. Hypokalemia-? 2/2 to acidosis.  Monitor in am-this is resolving.  Add Kdur 20 daily and rpt labs 9. Homelessness-Social worker to look into this 10. Bradycardia-seesm only at night abnd  Monitors are reassuring.  Keep on tele for now however   Patient seems stable for transfer to telemetry Oncology to comment on further options Await TEE   Verneita Griffes, MD  [facility] Pager [phone] 06/05/2015, 2:43 PM    LOS: 3 days

## 2015-06-05 NOTE — Evaluation (Signed)
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   Clinical Impression   Patient presenting with decreased ADL and functional mobility independence secondary to above. Patient independent, homeless and living in her car PTA. Patient currently functioning at an overall min to max assist level due to generalized weakness and decreased functional use of LUE. Patient will benefit from acute OT to increase overall independence in the areas of ADLs, functional mobility, and overall safety in order to safely discharge to venue listed below.     Follow Up Recommendations  SNF;Supervision/Assistance - 24 hour    Equipment Recommendations  Other (comment) (TBD)    Recommendations for Other Services  None at this time    Precautions / Restrictions Precautions Precautions: Fall Precaution Comments: wound vac Restrictions Weight Bearing Restrictions: No    Mobility Bed Mobility Overal bed mobility: Needs Assistance Bed Mobility: Supine to Sit;Sit to Supine     Supine to sit: Supervision Sit to supine: Supervision   General bed mobility comments: Supervision for safety  Transfers Overall transfer level: Needs assistance Equipment used: 1 person hand held assist Transfers: Sit to/from Stand Sit to Stand: Min guard General transfer comment: Min guard for safety    Balance Overall balance assessment: History of Falls;Needs assistance Sitting-balance support: No upper extremity supported;Feet supported Sitting balance-Leahy Scale: Good     Standing balance support: No upper extremity supported;During functional activity Standing balance-Leahy Scale: Fair    ADL Overall ADL's : Needs assistance/impaired Eating/Feeding: Set up;Sitting    Grooming: Min guard;Standing Grooming Details (indicate cue type and reason): at sink Upper Body Bathing: Sitting;Moderate assistance Upper Body Bathing Details (indicate cue type and reason): due to decreased ROM and strength in LUE/hand Lower Body Bathing: Moderate assistance;Sit to/from stand   Upper Body Dressing : Moderate assistance;Sitting   Lower Body Dressing: Maximal assistance;Sit to/from stand   Toilet Transfer: Minimal assistance;Ambulation;Comfort height toilet;Grab bars Functional mobility during ADLs: Minimal assistance;Cueing for safety General ADL Comments: Pt with difficulty completing ADLs secondary to decreased ROM and strength in LUE/hand. Pt tangential during session and would laugh and cry easily and quickly. Pt required mod cueing for safety and initiation of tasks. Pt alert and oriented.     Vision Additional Comments: To be further tested          Pertinent Vitals/Pain Pain Assessment: Faces Faces Pain Scale: Hurts even more Pain Location: left UE Pain Descriptors / Indicators: Discomfort;Guarding Pain Intervention(s): Limited activity within patient's tolerance;Monitored during session;Repositioned (placed LUE on pillows at end of session)     Hand Dominance Right   Extremity/Trunk Assessment Upper Extremity Assessment Upper Extremity Assessment: LUE deficits/detail (RUE is WFL) LUE Deficits / Details: Decreased strength, coordination, ROM, minimal use of left hand/fingers. Pt became teary eyed when therapist testing LUE ROM & strength. Decreased shoulder movement secondary to recent surgery, didn't go past ~80* of shoulder flexion.  LUE Coordination: decreased fine motor;decreased gross motor   Lower Extremity Assessment Lower Extremity Assessment: Defer to PT evaluation   Cervical / Trunk Assessment Cervical / Trunk Assessment: Kyphotic   Communication Communication Communication: No difficulties   Cognition Arousal/Alertness:  Awake/alert Behavior During Therapy: WFL for tasks assessed/performed Overall Cognitive Status: Impaired/Different from baseline Area of Impairment: Memory;Following commands;Safety/judgement;Awareness;Problem solving     Memory: Decreased short-term memory  Following Commands: Follows one step commands with increased time Safety/Judgement: Decreased awareness of safety;Decreased awareness of deficits Awareness: Emergent Problem Solving: Slow processing;Decreased initiation;Difficulty sequencing;Requires verbal cues;Requires tactile cues General Comments: Minimal confusion during session and somewhat tangential during session. Pt would go into bouts of laughter and bouts of tearing up.               Home Living Family/patient expects to be discharged to:: Shelter/Homeless Additional Comments: Pt reports she is trying to find housing for when she is discharged      Prior Functioning/Environment Level of Independence: Independent     OT Diagnosis: Generalized weakness;Acute pain;Altered mental status   OT Problem List: Decreased strength;Decreased range of motion;Decreased activity tolerance;Impaired balance (sitting and/or standing);Decreased coordination;Decreased cognition;Decreased safety awareness;Decreased knowledge of use of DME or AE;Decreased knowledge of precautions;Pain;Increased edema   OT Treatment/Interventions: Self-care/ADL training;Therapeutic exercise;Energy conservation;DME and/or AE instruction;Therapeutic activities;Patient/family education;Balance training    OT Goals(Current goals can be found in the care plan section) Acute Rehab OT Goals Patient Stated Goal: to get better OT Goal Formulation: With patient Time For Goal Achievement: 06/19/15 Potential to Achieve Goals: Good ADL Goals Pt Will Perform Grooming: with supervision;standing Pt Will Perform Lower Body Bathing: with supervision;sit to/from stand Pt Will Perform Lower Body Dressing: with  supervision;sit to/from stand Pt Will Transfer to Toilet: with supervision;bedside commode;ambulating Additional ADL Goal #1: Pt will be independent with a basic and appropriate HEP for LUE  OT Frequency: Min 2X/week   Barriers to D/C: Decreased caregiver support;Inaccessible home environment   End of Session Equipment Utilized During Treatment: Gait belt Nurse Communication: Mobility status  Activity Tolerance: Patient tolerated treatment well Patient left: in bed;with call bell/phone within reach   Time: 0955-1015 OT Time Calculation (min): 20 min Charges:  OT General Charges $OT Visit: 1 Procedure OT Evaluation $OT Eval Moderate Complexity: 1 Procedure  Chrys Racer , MS, OTR/L, CLT Pager: [phone]  06/05/2015, 10:42 AM

## 2015-06-05 NOTE — Progress Notes (Signed)
[facility] Progress Note  3 Days Post-Op  Subjective: Not much pain.  Tolerating diet well, sleepy this am.  No N/V.  VAC drainage serosang.  Ambulating in halls.  No other complaints.    Objective: Vital signs in last 24 hours: Temp:  [98.2 F (36.8 C)-99 F (37.2 C)] 98.2 F (36.8 C) (02/06 0634) Pulse Rate:  [65-66] 65 (02/06 0634) Resp:  [10-17] 17 (02/06 0634) BP: (144-170)/(75-78) 170/76 mmHg (02/06 0634) SpO2:  [97 %-100 %] 100 % (02/06 0634) Last BM Date: 06/05/15  Intake/Output from previous day: 02/05 0701 - 02/06 0700 In: 2944 [P.O.:820; I.V.:2024; IV Piggyback:100] Out: 3975 [Urine:3975] Intake/Output this shift:    PE: Gen:  Alert, NAD, pleasant Card:  RRR, no M/G/R heard Pulm:  CTA, no W/R/R Left breast:  Soft, no significant erythema or induration, drainage in vac canister serosanguinous.  VAC sponge in place.  Lab Results:   Recent Labs  06/03/15 0237 06/05/15 0433  WBC 12.2* 8.7  HGB 6.6* 6.2*  HCT 20.7* 19.4*  PLT 399 439*   BMET  Recent Labs  06/03/15 0237 06/05/15 0433  NA 136 141  K 4.0 3.3*  CL 108 109  CO2 18* 24  GLUCOSE 183* 85  BUN 13 7  CREATININE 1.19* 0.98  CALCIUM 7.9* 7.5*   PT/INR  Recent Labs  06/03/15 0237  LABPROT 15.2  INR 1.18   CMP     Component Value Date/Time   NA 141 06/05/2015 0433   K 3.3* 06/05/2015 0433   CL 109 06/05/2015 0433   CO2 24 06/05/2015 0433   GLUCOSE 85 06/05/2015 0433   BUN 7 06/05/2015 0433   CREATININE 0.98 06/05/2015 0433   CALCIUM 7.5* 06/05/2015 0433   PROT 5.1* 06/03/2015 0237   ALBUMIN 1.6* 06/03/2015 0237   AST 15 06/03/2015 0237   ALT 15 06/03/2015 0237   ALKPHOS 63 06/03/2015 0237   BILITOT 0.5 06/03/2015 0237   GFRNONAA 58* 06/05/2015 0433   GFRAA >60 06/05/2015 0433   Lipase  No results found for: LIPASE     Studies/Results: Ct Head W Wo Contrast  06/03/2015  CLINICAL DATA:  70 year old female with newly diagnosed breast cancer status post  mastectomy yesterday. Subsequent encounter. EXAM: CT HEAD WITHOUT AND WITH CONTRAST TECHNIQUE: Contiguous axial images were obtained from the base of the skull through the vertex without and with intravenous contrast CONTRAST:  72mL OMNIPAQUE IOHEXOL 350 MG/ML SOLN COMPARISON:  Chest CT 06/02/2015. FINDINGS: Mild right mastoid effusion. Negative visible nasopharynx. Other Visualized paranasal sinuses and mastoids are clear. No acute or suspicious osseous lesion identified in the visible skull. Visualized scalp soft tissues are within normal limits. Along the right lateral hemisphere there is a 3.3 cm length 1.4 cm with peripheral, dural based appearing hyperdense lesion with enhancement at the level of the operculum (series 4, image 15). Despite this lesions size, there is no definite associated edema. There is patchy and confluent bilateral cerebral white matter hypodensity which might in part be small vessel related, however, following contrast there are multiple subcentimeter round enhancing lesions (most about 7 mm diameter) identified in the right frontal lobe (series 6, images 13 and 14), left frontal lobe, and posterior left temporal lobe. The largest of these lesions is a 14 mm left operculum metastasis on series 6, image 17. Associated vasogenic edema but no significant mass effect. In all 6 such lesions are identified. Despite these findings there is no intracranial mass effect. Basilar cisterns remain patent.  No chronic cortically based infarct identified. No ventriculomegaly. No acute intracranial hemorrhage identified. Major intracranial vascular structures are enhancing. IMPRESSION: 1. Positive for metastatic disease to the brain. At least 6 round enhancing brain masses in both hemispheres, most are small - about 7 mm in size while the largest is 14 mm. Some vasogenic edema but no significant intracranial mass effect. 2. Superimposed moderate-sized 3 cm right operculum extra-axial appearing hyperdense  an enhancing mass. Although dural based metastasis is possible this may instead reflect a superimposed benign meningioma. 3. Follow-up brain MRI without and with contrast may clarify the diagnosis of #2 and detect additional small brain metastases. Electronically Signed   By: Genevie Ann M.D.   On: 06/03/2015 13:41   Ct Abdomen Pelvis W Contrast  06/03/2015  CLINICAL DATA:  Patient status post LEFT mastectomy for metastatic breast cancer. Patient did lesion discovered on CT exam from 06/02/2015. EXAM: CT ANGIOGRAPHY CHEST CT ABDOMEN AND PELVIS WITH CONTRAST TECHNIQUE: Multidetector CT imaging of the chest was performed using the standard protocol during bolus administration of intravenous contrast. Multiplanar CT image reconstructions and MIPs were obtained to evaluate the vascular anatomy. Multidetector CT imaging of the abdomen and pelvis was performed using the standard protocol during bolus administration of intravenous contrast. CONTRAST:  80 mL Omnipaque COMPARISON:  CT 06/02/2015. FINDINGS: CTA CHEST FINDINGS Mediastinum/Nodes: Again demonstrated a irregular mass within the ascending aorta with a stalk like attachment to the aortic wall seen best on image 72, series 12 sagittal series. At its attachment the lesion puckers the wall of the aorta. The lesion is difficult to measure but measure approximately 1.4 cm (image 60, series 8). There is abnormal thickening of the adventitia of the aorta of throughout the aorta. No additional lesions are present within the aorta. There is uniform thickening of the adventitia throughout aorta. Great vessels are normal. No pulmonary embolism. No pericardial fluid. The LEFT and RIGHT ventricle appear normal. Normal atria Lungs/Pleura: No pulmonary nodularity suggest metastasis. Small bilateral pleural effusions. Musculoskeletal: A postsurgical change in the LEFT chest wall consistent with mastectomy. CT ABDOMEN and PELVIS FINDINGS Hepatobiliary: Small hypodense lesion in the  superior aspect of the LEFT hepatic lobe has low attenuation consistent with a cyst. No biliary duct dilatation. Gallbladder normal. Pancreas: Pancreas is normal. No ductal dilatation. No pancreatic inflammation. Spleen: Normal spleen Adrenals/urinary tract: There is bilateral nodular adrenal thickening. The lesion of concern on comparison CT is felt to represent a nodule enlargement of the medial limb of the RIGHT adrenal gland to 13 mm. . No renal obstruction. Ureters are normal. On the lateral projection there is suggestion hypodensity within the dome the bladder which is felt to represent streak artifact from adjacent contrast within the bowel. Stomach/Bowel: Stomach, small bowel, appendix are normal. There is diffuse vessel thickening in the ascending colon without pericolonic inflammation. No mass lesion or obstruction. The LEFT colon and rectosigmoid colon are normal. Vascular/Lymphatic: Abdominal aorta is normal caliber. There is no retroperitoneal or periportal lymphadenopathy. No pelvic lymphadenopathy. Reproductive: Uterus and ovaries are normal. Other: No free fluid. Musculoskeletal: No aggressive osseous lesion. Review of the MIP images confirms the above findings. IMPRESSION: Chest Impression: 1. Pedunculated lesion within the ascending aorta with differential including infectious versus neoplastic etiology. Transesophageal echocardiography may provide further characterization. 2. Uniform adventitial thickening involving the entirety the thoracic aorta. 3. No evidence of pulmonary metastasis. 4. Postsurgical changes in the LEFT chest wall Abdomen / Pelvis Impression: 1. Small hypodense lesion in the liver likely represents  benign cyst. 2. Nodular enlargement of the RIGHT adrenal gland is indeterminate. Consider FDG PET scan for complete staging. 3. No metastatic disease identified elsewhere in the abdomen or pelvis. 4. Mucosal thickening in the ascending colon is nonspecific but suggests segmental  colitis. Electronically Signed   By: Suzy Bouchard M.D.   On: 06/03/2015 13:57   Ct Angio Chest Aorta W/cm &/or Wo/cm  06/03/2015  CLINICAL DATA:  Patient status post LEFT mastectomy for metastatic breast cancer. Patient did lesion discovered on CT exam from 06/02/2015. EXAM: CT ANGIOGRAPHY CHEST CT ABDOMEN AND PELVIS WITH CONTRAST TECHNIQUE: Multidetector CT imaging of the chest was performed using the standard protocol during bolus administration of intravenous contrast. Multiplanar CT image reconstructions and MIPs were obtained to evaluate the vascular anatomy. Multidetector CT imaging of the abdomen and pelvis was performed using the standard protocol during bolus administration of intravenous contrast. CONTRAST:  80 mL Omnipaque COMPARISON:  CT 06/02/2015. FINDINGS: CTA CHEST FINDINGS Mediastinum/Nodes: Again demonstrated a irregular mass within the ascending aorta with a stalk like attachment to the aortic wall seen best on image 72, series 12 sagittal series. At its attachment the lesion puckers the wall of the aorta. The lesion is difficult to measure but measure approximately 1.4 cm (image 60, series 8). There is abnormal thickening of the adventitia of the aorta of throughout the aorta. No additional lesions are present within the aorta. There is uniform thickening of the adventitia throughout aorta. Great vessels are normal. No pulmonary embolism. No pericardial fluid. The LEFT and RIGHT ventricle appear normal. Normal atria Lungs/Pleura: No pulmonary nodularity suggest metastasis. Small bilateral pleural effusions. Musculoskeletal: A postsurgical change in the LEFT chest wall consistent with mastectomy. CT ABDOMEN and PELVIS FINDINGS Hepatobiliary: Small hypodense lesion in the superior aspect of the LEFT hepatic lobe has low attenuation consistent with a cyst. No biliary duct dilatation. Gallbladder normal. Pancreas: Pancreas is normal. No ductal dilatation. No pancreatic inflammation. Spleen:  Normal spleen Adrenals/urinary tract: There is bilateral nodular adrenal thickening. The lesion of concern on comparison CT is felt to represent a nodule enlargement of the medial limb of the RIGHT adrenal gland to 13 mm. . No renal obstruction. Ureters are normal. On the lateral projection there is suggestion hypodensity within the dome the bladder which is felt to represent streak artifact from adjacent contrast within the bowel. Stomach/Bowel: Stomach, small bowel, appendix are normal. There is diffuse vessel thickening in the ascending colon without pericolonic inflammation. No mass lesion or obstruction. The LEFT colon and rectosigmoid colon are normal. Vascular/Lymphatic: Abdominal aorta is normal caliber. There is no retroperitoneal or periportal lymphadenopathy. No pelvic lymphadenopathy. Reproductive: Uterus and ovaries are normal. Other: No free fluid. Musculoskeletal: No aggressive osseous lesion. Review of the MIP images confirms the above findings. IMPRESSION: Chest Impression: 1. Pedunculated lesion within the ascending aorta with differential including infectious versus neoplastic etiology. Transesophageal echocardiography may provide further characterization. 2. Uniform adventitial thickening involving the entirety the thoracic aorta. 3. No evidence of pulmonary metastasis. 4. Postsurgical changes in the LEFT chest wall Abdomen / Pelvis Impression: 1. Small hypodense lesion in the liver likely represents benign cyst. 2. Nodular enlargement of the RIGHT adrenal gland is indeterminate. Consider FDG PET scan for complete staging. 3. No metastatic disease identified elsewhere in the abdomen or pelvis. 4. Mucosal thickening in the ascending colon is nonspecific but suggests segmental colitis. Electronically Signed   By: Suzy Bouchard M.D.   On: 06/03/2015 13:57    Anti-infectives: Anti-infectives  Start     Dose/Rate Route Frequency Ordered Stop   06/03/15 0000  vancomycin (VANCOCIN) IVPB 1000  mg/200 mL premix     1,000 mg 200 mL/hr over 60 Minutes Intravenous Every 24 hours 06/02/15 2350     06/02/15 2000  linezolid (ZYVOX) tablet 600 mg  Status:  Discontinued     600 mg Oral Every 12 hours 06/02/15 0454 06/02/15 2310   06/02/15 0600  linezolid (ZYVOX) IVPB 600 mg     600 mg 300 mL/hr over 60 Minutes Intravenous  Once 06/02/15 0454 06/02/15 0958   06/02/15 0600  Ampicillin-Sulbactam (UNASYN) 3 g in sodium chloride 0.9 % 100 mL IVPB    Comments:  Unasyn 3 g IV q6h for CrCl > 30 mL/min   3 g 100 mL/hr over 60 Minutes Intravenous Every 6 hours 06/02/15 0458     06/02/15 0100  vancomycin (VANCOCIN) 1,500 mg in sodium chloride 0.9 % 500 mL IVPB     1,500 mg 250 mL/hr over 120 Minutes Intravenous  Once 06/02/15 0055 06/02/15 0502   06/02/15 0100  piperacillin-tazobactam (ZOSYN) IVPB 3.375 g     3.375 g 100 mL/hr over 30 Minutes Intravenous  Once 06/02/15 0055 06/02/15 0202       Assessment/Plan 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 Alen Blew saw today, will refer to Dr. Lindi Adie as an outpatient -Pathology still pending -Leukocytosis resolved -Ambulate and IS -SCD's and okay to be on heparin/lovenox from our standpoint -VAC change M/W/F, change vac after plastics comes by today. -Plastic surgery consult ID-Unasyn and Vancomycin per ID Day #4 Possible vegetation in ascending aorta-per primary team. ID and Cards following AKI-resolved FEN-hypokalemia, supplemented K+ Disp-patient is homeless which will greatly complicate her recovery    LOS: 3 days    Nat Christen 06/05/2015, 9:03 AM Pager: [phone]

## 2015-06-05 NOTE — Care Management Important Message (Signed)
Important Message  Patient Details  Name: [facility] MRN: [id number] Date of Birth: 28-Jan-1946   Medicare Important Message Given:  Yes    [name] 06/05/2015, 11:02 AMImportant Message  Patient Details  Name: Robin [facility] MRN: [id number] Date of Birth: [birth year]   Medicare Important Message Given:  Yes    [name] G 06/05/2015, 11:02 AM

## 2015-06-05 NOTE — Progress Notes (Signed)
IP PROGRESS NOTE  Subjective:   Events in the last 48 hours noted. Patient's feeling well this morning without any complaints. She is not reporting any chest pain or difficulty breathing. Has not reported any tenderness around the surgery site. Appetite is reasonable. She denied any headaches, blurry vision or seizures.  Objective:  Vital signs in last 24 hours: Temp:  [98.2 F (36.8 C)-99 F (37.2 C)] 98.2 F (36.8 C) (02/06 0634) Pulse Rate:  [65-66] 65 (02/06 0634) Resp:  [10-17] 17 (02/06 0634) BP: (144-170)/(75-78) 170/76 mmHg (02/06 0634) SpO2:  [97 %-100 %] 100 % (02/06 0634) Weight change:  Last BM Date: 06/05/15  Intake/Output from previous day: 02/05 0701 - 02/06 0700 In: 2944 [P.O.:820; I.V.:2024; IV Piggyback:100] Out: 3975 [Urine:3975] Alert, awake woman appeared without distress. Mouth: mucous membranes moist, pharynx normal without lesions Resp: clear to auscultation bilaterally Cardio: regular rate and rhythm, S1, S2 normal, no murmur, click, rub or gallop GI: soft, non-tender; bowel sounds normal; no masses,  no organomegaly Extremities: extremities normal, atraumatic, no cyanosis or edema    Lab Results:  Recent Labs  06/03/15 0237 06/05/15 0433  WBC 12.2* 8.7  HGB 6.6* 6.2*  HCT 20.7* 19.4*  PLT 399 439*    BMET  Recent Labs  06/03/15 0237 06/05/15 0433  NA 136 141  K 4.0 3.3*  CL 108 109  CO2 18* 24  GLUCOSE 183* 85  BUN 13 7  CREATININE 1.19* 0.98  CALCIUM 7.9* 7.5*    Studies/Results: Ct Head W Wo Contrast  06/03/2015  CLINICAL DATA:  70 year old female with newly diagnosed breast cancer status post mastectomy yesterday. Subsequent encounter. EXAM: CT HEAD WITHOUT AND WITH CONTRAST TECHNIQUE: Contiguous axial images were obtained from the base of the skull through the vertex without and with intravenous contrast CONTRAST:  72mL OMNIPAQUE IOHEXOL 350 MG/ML SOLN COMPARISON:  Chest CT 06/02/2015. FINDINGS: Mild right mastoid effusion.  Negative visible nasopharynx. Other Visualized paranasal sinuses and mastoids are clear. No acute or suspicious osseous lesion identified in the visible skull. Visualized scalp soft tissues are within normal limits. Along the right lateral hemisphere there is a 3.3 cm length 1.4 cm with peripheral, dural based appearing hyperdense lesion with enhancement at the level of the operculum (series 4, image 15). Despite this lesions size, there is no definite associated edema. There is patchy and confluent bilateral cerebral white matter hypodensity which might in part be small vessel related, however, following contrast there are multiple subcentimeter round enhancing lesions (most about 7 mm diameter) identified in the right frontal lobe (series 6, images 13 and 14), left frontal lobe, and posterior left temporal lobe. The largest of these lesions is a 14 mm left operculum metastasis on series 6, image 17. Associated vasogenic edema but no significant mass effect. In all 6 such lesions are identified. Despite these findings there is no intracranial mass effect. Basilar cisterns remain patent. No chronic cortically based infarct identified. No ventriculomegaly. No acute intracranial hemorrhage identified. Major intracranial vascular structures are enhancing. IMPRESSION: 1. Positive for metastatic disease to the brain. At least 6 round enhancing brain masses in both hemispheres, most are small - about 7 mm in size while the largest is 14 mm. Some vasogenic edema but no significant intracranial mass effect. 2. Superimposed moderate-sized 3 cm right operculum extra-axial appearing hyperdense an enhancing mass. Although dural based metastasis is possible this may instead reflect a superimposed benign meningioma. 3. Follow-up brain MRI without and with contrast may clarify the diagnosis  of #2 and detect additional small brain metastases. Electronically Signed   By: Genevie Ann M.D.   On: 06/03/2015 13:41   Ct Abdomen Pelvis W  Contrast  06/03/2015  CLINICAL DATA:  Patient status post LEFT mastectomy for metastatic breast cancer. Patient did lesion discovered on CT exam from 06/02/2015. EXAM: CT ANGIOGRAPHY CHEST CT ABDOMEN AND PELVIS WITH CONTRAST TECHNIQUE: Multidetector CT imaging of the chest was performed using the standard protocol during bolus administration of intravenous contrast. Multiplanar CT image reconstructions and MIPs were obtained to evaluate the vascular anatomy. Multidetector CT imaging of the abdomen and pelvis was performed using the standard protocol during bolus administration of intravenous contrast. CONTRAST:  80 mL Omnipaque COMPARISON:  CT 06/02/2015. FINDINGS: CTA CHEST FINDINGS Mediastinum/Nodes: Again demonstrated a irregular mass within the ascending aorta with a stalk like attachment to the aortic wall seen best on image 72, series 12 sagittal series. At its attachment the lesion puckers the wall of the aorta. The lesion is difficult to measure but measure approximately 1.4 cm (image 60, series 8). There is abnormal thickening of the adventitia of the aorta of throughout the aorta. No additional lesions are present within the aorta. There is uniform thickening of the adventitia throughout aorta. Great vessels are normal. No pulmonary embolism. No pericardial fluid. The LEFT and RIGHT ventricle appear normal. Normal atria Lungs/Pleura: No pulmonary nodularity suggest metastasis. Small bilateral pleural effusions. Musculoskeletal: A postsurgical change in the LEFT chest wall consistent with mastectomy. CT ABDOMEN and PELVIS FINDINGS Hepatobiliary: Small hypodense lesion in the superior aspect of the LEFT hepatic lobe has low attenuation consistent with a cyst. No biliary duct dilatation. Gallbladder normal. Pancreas: Pancreas is normal. No ductal dilatation. No pancreatic inflammation. Spleen: Normal spleen Adrenals/urinary tract: There is bilateral nodular adrenal thickening. The lesion of concern on  comparison CT is felt to represent a nodule enlargement of the medial limb of the RIGHT adrenal gland to 13 mm. . No renal obstruction. Ureters are normal. On the lateral projection there is suggestion hypodensity within the dome the bladder which is felt to represent streak artifact from adjacent contrast within the bowel. Stomach/Bowel: Stomach, small bowel, appendix are normal. There is diffuse vessel thickening in the ascending colon without pericolonic inflammation. No mass lesion or obstruction. The LEFT colon and rectosigmoid colon are normal. Vascular/Lymphatic: Abdominal aorta is normal caliber. There is no retroperitoneal or periportal lymphadenopathy. No pelvic lymphadenopathy. Reproductive: Uterus and ovaries are normal. Other: No free fluid. Musculoskeletal: No aggressive osseous lesion. Review of the MIP images confirms the above findings. IMPRESSION: Chest Impression: 1. Pedunculated lesion within the ascending aorta with differential including infectious versus neoplastic etiology. Transesophageal echocardiography may provide further characterization. 2. Uniform adventitial thickening involving the entirety the thoracic aorta. 3. No evidence of pulmonary metastasis. 4. Postsurgical changes in the LEFT chest wall Abdomen / Pelvis Impression: 1. Small hypodense lesion in the liver likely represents benign cyst. 2. Nodular enlargement of the RIGHT adrenal gland is indeterminate. Consider FDG PET scan for complete staging. 3. No metastatic disease identified elsewhere in the abdomen or pelvis. 4. Mucosal thickening in the ascending colon is nonspecific but suggests segmental colitis. Electronically Signed   By: Suzy Bouchard M.D.   On: 06/03/2015 13:57   Ct Angio Chest Aorta W/cm &/or Wo/cm  06/03/2015  CLINICAL DATA:  Patient status post LEFT mastectomy for metastatic breast cancer. Patient did lesion discovered on CT exam from 06/02/2015. EXAM: CT ANGIOGRAPHY CHEST CT ABDOMEN AND PELVIS WITH  CONTRAST  TECHNIQUE: Multidetector CT imaging of the chest was performed using the standard protocol during bolus administration of intravenous contrast. Multiplanar CT image reconstructions and MIPs were obtained to evaluate the vascular anatomy. Multidetector CT imaging of the abdomen and pelvis was performed using the standard protocol during bolus administration of intravenous contrast. CONTRAST:  80 mL Omnipaque COMPARISON:  CT 06/02/2015. FINDINGS: CTA CHEST FINDINGS Mediastinum/Nodes: Again demonstrated a irregular mass within the ascending aorta with a stalk like attachment to the aortic wall seen best on image 72, series 12 sagittal series. At its attachment the lesion puckers the wall of the aorta. The lesion is difficult to measure but measure approximately 1.4 cm (image 60, series 8). There is abnormal thickening of the adventitia of the aorta of throughout the aorta. No additional lesions are present within the aorta. There is uniform thickening of the adventitia throughout aorta. Great vessels are normal. No pulmonary embolism. No pericardial fluid. The LEFT and RIGHT ventricle appear normal. Normal atria Lungs/Pleura: No pulmonary nodularity suggest metastasis. Small bilateral pleural effusions. Musculoskeletal: A postsurgical change in the LEFT chest wall consistent with mastectomy. CT ABDOMEN and PELVIS FINDINGS Hepatobiliary: Small hypodense lesion in the superior aspect of the LEFT hepatic lobe has low attenuation consistent with a cyst. No biliary duct dilatation. Gallbladder normal. Pancreas: Pancreas is normal. No ductal dilatation. No pancreatic inflammation. Spleen: Normal spleen Adrenals/urinary tract: There is bilateral nodular adrenal thickening. The lesion of concern on comparison CT is felt to represent a nodule enlargement of the medial limb of the RIGHT adrenal gland to 13 mm. . No renal obstruction. Ureters are normal. On the lateral projection there is suggestion hypodensity within  the dome the bladder which is felt to represent streak artifact from adjacent contrast within the bowel. Stomach/Bowel: Stomach, small bowel, appendix are normal. There is diffuse vessel thickening in the ascending colon without pericolonic inflammation. No mass lesion or obstruction. The LEFT colon and rectosigmoid colon are normal. Vascular/Lymphatic: Abdominal aorta is normal caliber. There is no retroperitoneal or periportal lymphadenopathy. No pelvic lymphadenopathy. Reproductive: Uterus and ovaries are normal. Other: No free fluid. Musculoskeletal: No aggressive osseous lesion. Review of the MIP images confirms the above findings. IMPRESSION: Chest Impression: 1. Pedunculated lesion within the ascending aorta with differential including infectious versus neoplastic etiology. Transesophageal echocardiography may provide further characterization. 2. Uniform adventitial thickening involving the entirety the thoracic aorta. 3. No evidence of pulmonary metastasis. 4. Postsurgical changes in the LEFT chest wall Abdomen / Pelvis Impression: 1. Small hypodense lesion in the liver likely represents benign cyst. 2. Nodular enlargement of the RIGHT adrenal gland is indeterminate. Consider FDG PET scan for complete staging. 3. No metastatic disease identified elsewhere in the abdomen or pelvis. 4. Mucosal thickening in the ascending colon is nonspecific but suggests segmental colitis. Electronically Signed   By: Suzy Bouchard M.D.   On: 06/03/2015 13:57    Medications: I have reviewed the patient's current medications.  Assessment/Plan:   70 year old woman with the following issues:  1. Left breast mass presented with a large fungating tumor. This will likely be locally advanced breast cancer and possibly metastatic if imaging studies of the abdomen and pelvis showed possible adrenal and liver metastasis. She is s/p salvage mastectomy for palliative purposes given the large tumor and the severe  infection.  CT scan of the abdomen and pelvis were reviewed and did not show clear-cut metastasis at this time. CT scan of the head did show possible CNS metastasis.  The final pathology of  her tumor is still pending and no systemic therapy will be started at this time. Further therapy will be dictated by her tumor characteristics and after her recovery from her recent infection.  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 Depending on these findings with her whole brain radiation or stereotactic radiosurgery will be determined.  3. Aortic arch vegetations. Possible septic versus neoplastic process. She is planned in the near future.  4. Anemia: She refused transfusions.  5. Psychosocial issues: She will require significant support from psychosocial services to navigate her through her treatment course.   I will refer her to breast cancer navigators to make sure any resources that are available for her moving forward.  I asked Dr. Lindi Adie to be her primary oncologist because of his expertise and breast cancer.   LOS: 3 days   [facility] 06/05/2015, 9:33 AM

## 2015-06-06 ENCOUNTER — Encounter (HOSPITAL_COMMUNITY): Payer: Self-pay | Admitting: *Deleted

## 2015-06-06 ENCOUNTER — Encounter (HOSPITAL_COMMUNITY)
Admission: EM | Disposition: A | Discharge status: Skilled Nursing Facility | Payer: Self-pay | Source: Home / Self Care | Attending: Family Medicine

## 2015-06-06 ENCOUNTER — Inpatient Hospital Stay (HOSPITAL_COMMUNITY): Payer: Medicare HMO

## 2015-06-06 DIAGNOSIS — I361 Nonrheumatic tricuspid (valve) insufficiency: Secondary | ICD-10-CM

## 2015-06-06 DIAGNOSIS — R7881 Bacteremia: Secondary | ICD-10-CM

## 2015-06-06 HISTORY — PX: TEE WITHOUT CARDIOVERSION: SHX5443

## 2015-06-06 LAB — TYPE AND SCREEN
ABO/RH(D): O POS
ANTIBODY SCREEN: NEGATIVE
Unit division: 0

## 2015-06-06 LAB — GLUCOSE, CAPILLARY: GLUCOSE-CAPILLARY: 87 mg/dL (ref 65–99)

## 2015-06-06 SURGERY — ECHOCARDIOGRAM, TRANSESOPHAGEAL
Anesthesia: Moderate Sedation

## 2015-06-06 MED ORDER — DIPHENHYDRAMINE HCL 50 MG/ML IJ SOLN
INTRAMUSCULAR | Status: AC
  Filled 2015-06-06: qty 1

## 2015-06-06 MED ORDER — POTASSIUM CHLORIDE CRYS ER 20 MEQ PO TBCR
40.0000 meq | EXTENDED_RELEASE_TABLET | Freq: Every day | ORAL | Status: DC
  Administered 2015-06-07 – 2015-06-10 (×3): 40 meq via ORAL
  Filled 2015-06-06 (×3): qty 2

## 2015-06-06 MED ORDER — FENTANYL CITRATE (PF) 100 MCG/2ML IJ SOLN
INTRAMUSCULAR | Status: AC
  Filled 2015-06-06: qty 2

## 2015-06-06 MED ORDER — MIDAZOLAM HCL 10 MG/2ML IJ SOLN
INTRAMUSCULAR | Status: DC | PRN
  Administered 2015-06-06: 1 mg via INTRAVENOUS

## 2015-06-06 MED ORDER — FENTANYL CITRATE (PF) 100 MCG/2ML IJ SOLN
INTRAMUSCULAR | Status: DC | PRN
  Administered 2015-06-06: 12.5 ug via INTRAVENOUS

## 2015-06-06 MED ORDER — BUTAMBEN-TETRACAINE-BENZOCAINE 2-2-14 % EX AERO
INHALATION_SPRAY | CUTANEOUS | Status: DC | PRN
Start: 2015-06-06 — End: 2015-06-06
  Administered 2015-06-06: 2 via TOPICAL

## 2015-06-06 MED ORDER — MIDAZOLAM HCL 5 MG/ML IJ SOLN
INTRAMUSCULAR | Status: AC
  Filled 2015-06-06: qty 2

## 2015-06-06 NOTE — CV Procedure (Signed)
Brief TEE Report  Moderate Sedation: Versed 3 mg, Fentanyl 37.5 mcg  LVEF >55% Mild TR.  Trivial AR, MR, and PR. There was a pedunculated, mobile mass in the ascending aorta. There was also a mobile mass in the pericardial space inferior to the left atrial appendage.  For further details see the full report.  Timarie Labell C. Oval Linsey, MD, [facility]  06/06/2015  10:43 AM

## 2015-06-06 NOTE — Progress Notes (Signed)
Her mastectomy pathology noted. The results indicate locally advanced breast cancer which is certainly not surprising. Her case will be presented in the breast cancer tumor board and future treatment plan will be dictated by the discussion.  As far as her anemia, I agree with IV iron replacement and we'll consider starting growth factor support as an outpatient.  She will need MRI of the brain to better determine the best course of action to treat her brain metastasis.

## 2015-06-06 NOTE — Interval H&P Note (Signed)
History and Physical Interval Note:  06/06/2015 9:36 AM  Robin Cantrell  has presented today for surgery, with the diagnosis of QUESTIONABLE MASS  The various methods of treatment have been discussed with the patient and family. After consideration of risks, benefits and other options for treatment, the patient has consented to  Procedure(s): TRANSESOPHAGEAL ECHOCARDIOGRAM (TEE) (N/A) as a surgical intervention .  The patient's history has been reviewed, patient examined, no change in status, stable for surgery.  I have reviewed the patient's chart and labs.  Questions were answered to the patient's satisfaction.     Christena Sunderlin C. Oval Linsey, MD, [place]  06/06/2015 9:36 AM

## 2015-06-06 NOTE — Clinical Social Work Note (Signed)
Clinical Social Work Assessment  Patient Details  Name: Robin Cantrell MRN: [id number] Date of Birth: 12/07/1945  Date of referral:  06/06/15               Reason for consult:  Facility Placement                Permission sought to share information with:  Case Manager, Family Supports Permission granted to share information::  Yes, Verbal Permission Granted  Name::     Herbert Moors and [place]::     Relationship::  Brother   Contact Information:  Patient did not provide number  Housing/Transportation Living arrangements for the past 2 months:  Homeless (Patient reports she has been living in her truck for 5-6 months. ) Source of Information:  Patient Patient Interpreter Needed:  None Criminal Activity/Legal Involvement Pertinent to Current Situation/Hospitalization:  No - Comment as needed Significant Relationships:  None Lives with:  Other (Comment) (Homeless) Do you feel safe going back to the place where you live?  No Need for family participation in patient care:  Yes (Comment)  Care giving concerns:  None reported at this time.    Social Worker assessment / plan:  CSW received consult by Therapist, sports. CSW went to speak with patient regarding the possible need for short term rehab. CSW introduced self and acknowledged the patient. Patient is alert and oriented. Patient was cooperative with CSW assessment however became very tearful speaking about her situation. No family was at bedside. CSW informed patient of PT recommendation for short term rehab. Patient is agreeable to SNF placement. CSW provided patient with a list of SNF facilities in [facility], and was provided permission to fax clinical information out to the facilities. Patient reports she is homeless and has been living in her truck for the past 5-6 months. Patient reports she has two sons: one in [place] and one in [place]. Patient reports her sons are coming to visit her on today. CSW to complete FL2 for  MD signature. CSW to initiate SNF placement process.    Employment status:  Disabled (Comment on whether or not currently receiving Disability) Insurance information:  Managed Medicare PT Recommendations:  [place] / Referral to community resources:     Patient/Family's Response to care:  Patient is agreeable to SNF placement in [employer]   Patient/Family's Understanding of and Emotional Response to Diagnosis, Current Treatment, and Prognosis:  Patient was very cooperative with CSW assessment. Patient is agreeable to disposition plan. Patient is hopeful that she will find housing soon and no longer has to live out of her truck. Patient was appreciative of CSW intervention.    Emotional Assessment Appearance:  Appears stated age Attitude/Demeanor/Rapport:  Crying (Cooperative ) Affect (typically observed):  Accepting, Sad, Hopeless Orientation:  Oriented to Self, Oriented to Place, Oriented to Situation Alcohol / Substance use:  Not Applicable Psych involvement (Current and /or in the community):  No (Comment)  Discharge Needs  Concerns to be addressed:  Discharge Planning Concerns Readmission within the last 30 days:  No Current discharge risk:  Physical Impairment Barriers to Discharge:   (Requires further medical work up)   Raymondo Band, LCSW 06/06/2015, 3:47 PM

## 2015-06-06 NOTE — Progress Notes (Signed)
Subjective: No new complaints. No fevers. Underwent TEE without difficulty  Interval hx: TEE revealed There was a pedunculated, mobile mass in the ascending aorta. There was also a mobile mass in the pericardial space inferior to the left atrial appendage.    Antibiotics:  Anti-infectives    Start     Dose/Rate Route Frequency Ordered Stop   06/05/15 1400  vancomycin (VANCOCIN) IVPB 750 mg/150 ml premix     750 mg 150 mL/hr over 60 Minutes Intravenous Every 12 hours 06/05/15 1258     06/03/15 0000  vancomycin (VANCOCIN) IVPB 1000 mg/200 mL premix  Status:  Discontinued     1,000 mg 200 mL/hr over 60 Minutes Intravenous Every 24 hours 06/02/15 2350 06/05/15 1258   06/02/15 2000  linezolid (ZYVOX) tablet 600 mg  Status:  Discontinued     600 mg Oral Every 12 hours 06/02/15 0454 06/02/15 2310   06/02/15 0600  linezolid (ZYVOX) IVPB 600 mg     600 mg 300 mL/hr over 60 Minutes Intravenous  Once 06/02/15 0454 06/02/15 0958   06/02/15 0600  Ampicillin-Sulbactam (UNASYN) 3 g in sodium chloride 0.9 % 100 mL IVPB    Comments:  Unasyn 3 g IV q6h for CrCl > 30 mL/min   3 g 100 mL/hr over 60 Minutes Intravenous Every 6 hours 06/02/15 0458     06/02/15 0100  vancomycin (VANCOCIN) 1,500 mg in sodium chloride 0.9 % 500 mL IVPB     1,500 mg 250 mL/hr over 120 Minutes Intravenous  Once 06/02/15 0055 06/02/15 0502   06/02/15 0100  piperacillin-tazobactam (ZOSYN) IVPB 3.375 g     3.375 g 100 mL/hr over 30 Minutes Intravenous  Once 06/02/15 0055 06/02/15 0202      Medications: Scheduled Meds: . sodium chloride   Intravenous Once  . ampicillin-sulbactam (UNASYN) IV  3 g Intravenous Q6H  . [START ON 06/07/2015] potassium chloride  40 mEq Oral Daily  . sodium chloride flush  3 mL Intravenous Q12H  . vancomycin  750 mg Intravenous Q12H    Objective: Weight change:   Intake/Output Summary (Last 24 hours) at 06/06/15 1646 Last data filed at 06/06/15 1500  Gross per 24 hour  Intake  1771.67 ml  Output   2850 ml  Net -1078.33 ml   Blood pressure 139/81, pulse 69, temperature 98.4 F (36.9 C), temperature source Oral, resp. rate 14, height 5\' 8"  (1.727 m), weight 139 lb 12.4 oz (63.4 kg), SpO2 100 %. Temp:  [98.1 F (36.7 C)-99 F (37.2 C)] 98.4 F (36.9 C) (02/07 1351) Pulse Rate:  [54-75] 69 (02/07 1351) Resp:  [9-19] 14 (02/07 1351) BP: (119-198)/(61-94) 139/81 mmHg (02/07 1351) SpO2:  [95 %-100 %] 100 % (02/07 1351)  Physical Exam: General: Alert and awake, oriented x3, not in any acute distress. HEENT: anicteric sclera,EOMI CVS regular rate, normal r,  no murmur rubs or gallops Chest: clear to auscultation bilaterally, no wheezing, rales or rhonchi  Abdomen: soft nontender, nondistended, normal bowel sounds, Extremities: no  clubbing or edema noted bilaterally Skin: left chest with dressing in place vaccum Neuro: nonfocal  CBC:  CBC Latest Ref Rng 06/05/2015 06/03/2015 06/03/2015  WBC 4.0 - 10.5 K/uL 8.7 12.2(H) -  Hemoglobin 12.0 - 15.0 g/dL 6.2(LL) 6.6(LL) -  Hematocrit 36.0 - 46.0 % 19.4(L) 19.4(L) 20.7(L)  Platelets 150 - 400 K/uL 439(H) 399 -      BMET  Recent Labs  06/05/15 0433  NA 141  K  3.3*  CL 109  CO2 24  GLUCOSE 85  BUN 7  CREATININE 0.98  CALCIUM 7.5*     Liver Panel  No results for input(s): PROT, ALBUMIN, AST, ALT, ALKPHOS, BILITOT, BILIDIR, IBILI in the last 72 hours.   Micro Results: Recent Results (from the past 720 hour(s))  Culture, blood (Routine X 2) w Reflex to ID Panel     Status: None   Collection Time: 06/02/15  1:10 AM  Result Value Ref Range Status   Specimen Description BLOOD RIGHT ARM  Final   Special Requests BOTTLES DRAWN AEROBIC AND ANAEROBIC 10ML  Final   Culture  Setup Time   Final    GRAM POSITIVE RODS AEROBIC BOTTLE ONLY CRITICAL RESULT CALLED TO, READ BACK BY AND VERIFIED WITH: Youlanda Roys AT [id number] 06/03/15 BY L BENFIELD    Culture   Final    DIPHTHEROIDS(CORYNEBACTERIUM  SPECIES) Standardized susceptibility testing for this organism is not available.    Report Status 06/05/2015 FINAL  Final  Culture, blood (Routine X 2) w Reflex to ID Panel     Status: None (Preliminary result)   Collection Time: 06/02/15  1:15 AM  Result Value Ref Range Status   Specimen Description BLOOD RIGHT HAND  Final   Special Requests AEROBIC BOTTLE ONLY 5ML  Final   Culture NO GROWTH 4 DAYS  Final   Report Status PENDING  Incomplete  Surgical pcr screen     Status: None   Collection Time: 06/02/15  4:35 AM  Result Value Ref Range Status   MRSA, PCR NEGATIVE NEGATIVE Final   Staphylococcus aureus NEGATIVE NEGATIVE Final    Comment:        The Xpert SA Assay (FDA approved for NASAL specimens in patients over 77 years of age), is one component of a comprehensive surveillance program.  Test performance has been validated by [facility] for patients greater than or equal to 54 year old. It is not intended to diagnose infection nor to guide or monitor treatment.   Culture, blood (Routine X 2) w Reflex to ID Panel     Status: None (Preliminary result)   Collection Time: 06/03/15  3:00 PM  Result Value Ref Range Status   Specimen Description BLOOD RIGHT ANTECUBITAL  Final   Special Requests IN PEDIATRIC BOTTLE 3CC  Final   Culture NO GROWTH 3 DAYS  Final   Report Status PENDING  Incomplete  Culture, blood (Routine X 2) w Reflex to ID Panel     Status: None (Preliminary result)   Collection Time: 06/03/15  3:05 PM  Result Value Ref Range Status   Specimen Description BLOOD BLOOD RIGHT HAND  Final   Special Requests IN PEDIATRIC BOTTLE 3CC  Final   Culture NO GROWTH 3 DAYS  Final   Report Status PENDING  Incomplete   06/03/15: blood culture with GPR 1/2 - diptheroids  Studies/Results: No results found.  06/03/15: CTA  Shows: Pedunculated lesion within the ascending aorta with differential including infectious versus neoplastic etiology.     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 female with  Necrotic breast likely due to breast cancer with superimposed infection also with vegetation in aortic arch  #1 Necrotic breast tissue: - pathology c/w with breast Ca. Oncology asking to get brain MRI in order to stage her disease  Continue vancomycin and unasyn for now. Plan to treat as mop up with 14 days of treatment but we will see if can switch to orals once ready for discharge  #2 Corynebacterium in 1/2 cultures equals contaminant  #3 Vegetation in aortic arch and mobile mass in pericardial space: It is not at all clear that this is an infectious vegetation could easily be an noninfectious vegetation associated with her malignancy.   For now continue vanco/unasyn     LOS: 4 days   Carlyle Basques 06/06/2015, 4:46 PM

## 2015-06-06 NOTE — Progress Notes (Signed)
Patient ID: Robin Cantrell, female   DOB: 1945/10/25, 70 y.o.   MRN: [id number]     [facility]      [address] [place], [place] [address]    Phone: [phone] FAX: [phone]     Subjective: Pathology reviewed with the patient.  Sitting up in bed, eating. VSS.  Afebrile.  hgb 6.2  No complaints.   Objective:  Vital signs:  Filed Vitals:   06/06/15 1130 06/06/15 1140 06/06/15 1150 06/06/15 1351  BP:  183/61 175/78 139/81  Pulse: 60 54 58 69  Temp:    98.4 F (36.9 C)  TempSrc:    Oral  Resp: _0 Height:      Weight:      SpO2: 96% 95% 97% 100%    Last BM Date: 06/05/15  Intake/Output   Yesterday:  02/06 0701 - 02/07 0700 In: 2421.7 [P.O.:360; I.V.:1261.7; IV Piggyback:800] Out: 9211 [Urine:3650; Drains:75] This shift:  Total I/O In: 480 [P.O.:480] Out: 350 [Urine:350]   Physical Exam: General: Pt awake/alert/oriented x4 in no acute distress  Skin: VAC in place left breast    Problem List:   Principal Problem:   Infection of left breast Active Problems:   Left breast mass   Breast cancer (HCC)   AKI (acute kidney injury) ([place])   Breast infection   Homeless   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HCC)   Aorta disorder (HCC)   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 ([place])   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 [facility])    Results:   Labs: Results for orders placed or performed during the hospital encounter of 06/02/15 (from the past 48 hour(s))  Basic metabolic panel     Status: Abnormal   Collection Time: 06/05/15  4:33 AM  Result Value Ref Range   Sodium 141 135 - 145 mmol/L   Potassium 3.3 (L) 3.5 - 5.1 mmol/L   Chloride 109 101 - 111 mmol/L   CO2 24 22 - 32 mmol/L   Glucose, Bld 85 65 - 99 mg/dL   BUN 7 6 - 20 mg/dL   Creatinine, Ser 0.98 0.44 - 1.00 mg/dL   Calcium 7.5 (L) 8.9 - 10.3 mg/dL   GFR calc non Af Amer 58 (L) >60 mL/min   GFR calc Af Amer >60 >60 mL/min    Comment:  (NOTE) The eGFR has been calculated using the CKD EPI equation. This calculation has not been validated in all clinical situations. eGFR's persistently <60 mL/min signify possible Chronic Kidney Disease.    Anion gap 8 5 - 15  CBC with Differential/Platelet     Status: Abnormal   Collection Time: 06/05/15  4:33 AM  Result Value Ref Range   WBC 8.7 4.0 - 10.5 K/uL   RBC 2.55 (L) 3.87 - 5.11 MIL/uL   Hemoglobin 6.2 (LL) 12.0 - 15.0 g/dL    Comment: REPEATED TO VERIFY CRITICAL VALUE NOTED.  VALUE IS CONSISTENT WITH PREVIOUSLY REPORTED AND CALLED VALUE.    HCT 19.4 (L) 36.0 - 46.0 %   MCV 76.1 (L) 78.0 - 100.0 fL   MCH 24.3 (L) 26.0 - 34.0 pg   MCHC 32.0 30.0 - 36.0 g/dL   RDW 14.4 11.5 - 15.5 %   Platelets 439 (H) 150 - 400 K/uL   Neutrophils Relative % 62 %   Neutro Abs 5.4 1.7 - 7.7 K/uL   Lymphocytes Relative 29 %   Lymphs Abs 2.5 0.7 - 4.0  K/uL   Monocytes Relative 7 %   Monocytes Absolute 0.6 0.1 - 1.0 K/uL   Eosinophils Relative 2 %   Eosinophils Absolute 0.2 0.0 - 0.7 K/uL   Basophils Relative 1 %   Basophils Absolute 0.1 0.0 - 0.1 K/uL  Creatinine, urine, random     Status: None   Collection Time: 06/05/15  5:00 AM  Result Value Ref Range   Creatinine, Urine 15.85 mg/dL  Sodium, urine, random     Status: None   Collection Time: 06/05/15  5:00 AM  Result Value Ref Range   Sodium, Ur 117 mmol/L  Glucose, capillary     Status: None   Collection Time: 06/05/15  8:43 AM  Result Value Ref Range   Glucose-Capillary 79 65 - 99 mg/dL   Comment 1 Notify RN   Glucose, capillary     Status: None   Collection Time: 06/06/15  7:27 AM  Result Value Ref Range   Glucose-Capillary 87 65 - 99 mg/dL    Imaging / Studies: No results found.  Medications / Allergies:  Scheduled Meds: . sodium chloride   Intravenous Once  . ampicillin-sulbactam (UNASYN) IV  3 g Intravenous Q6H  . [START ON 06/07/2015] potassium chloride  40 mEq Oral Daily  . sodium chloride flush  3 mL  Intravenous Q12H  . vancomycin  750 mg Intravenous Q12H   Continuous Infusions: . sodium chloride 50 mL/hr at 06/05/15 1648   PRN Meds:.acetaminophen **OR** acetaminophen, alum & mag hydroxide-simeth, morphine injection, ondansetron **OR** ondansetron (ZOFRAN) IV, oxyCODONE-acetaminophen  Antibiotics: Anti-infectives    Start     Dose/Rate Route Frequency Ordered Stop   06/05/15 1400  vancomycin (VANCOCIN) IVPB 750 mg/150 ml premix     750 mg 150 mL/hr over 60 Minutes Intravenous Every 12 hours 06/05/15 1258     06/03/15 0000  vancomycin (VANCOCIN) IVPB 1000 mg/200 mL premix  Status:  Discontinued     1,000 mg 200 mL/hr over 60 Minutes Intravenous Every 24 hours 06/02/15 2350 06/05/15 1258   06/02/15 2000  linezolid (ZYVOX) tablet 600 mg  Status:  Discontinued     600 mg Oral Every 12 hours 06/02/15 0454 06/02/15 2310   06/02/15 0600  linezolid (ZYVOX) IVPB 600 mg     600 mg 300 mL/hr over 60 Minutes Intravenous  Once 06/02/15 0454 06/02/15 0958   06/02/15 0600  Ampicillin-Sulbactam (UNASYN) 3 g in sodium chloride 0.9 % 100 mL IVPB    Comments:  Unasyn 3 g IV q6h for CrCl > 30 mL/min   3 g 100 mL/hr over 60 Minutes Intravenous Every 6 hours 06/02/15 0458     06/02/15 0100  vancomycin (VANCOCIN) 1,500 mg in sodium chloride 0.9 % 500 mL IVPB     1,500 mg 250 mL/hr over 120 Minutes Intravenous  Once 06/02/15 0055 06/02/15 0502   06/02/15 0100  piperacillin-tazobactam (ZOSYN) IVPB 3.375 g     3.375 g 100 mL/hr over 30 Minutes Intravenous  Once 06/02/15 0055 06/02/15 0202        Assessment/Plan Necrotic left breast mass  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 Lindi Adie would probably be helpful if we had definitive plans so that plastics can make recommendations re flap.  -Ambulate and IS -SCD's and okay to be on heparin/lovenox from our  standpoint -VAC change M/W/F -Plastic surgery consult ID-Unasyn and Vancomycin per  ID Day #4 Possible vegetation in ascending aorta-TEE shows mass v thrombus v vegetation  AKI-resolved Anemia-hgb 6.2 refusing blood tx  FEN-tolerating POs.  k supplemented  Disp-patient is homeless which will greatly complicate her recovery   Erby Pian, [facility] Surgery Pager [phone](7A-4:30P) For consults and floor pages call [phone](7A-4:30P)  06/06/2015 3:07 PM

## 2015-06-06 NOTE — Progress Notes (Signed)
Robin Cantrell PX:[id number] DOB: 23-Jul-1945 DOA: 06/02/2015 PCP: No primary care provider on file.  Brief narrative:  70 y/o ? No prior PMH Homeless Admitted from [facility] ED 06/02/15 with fungating breast lesion ~ 5 mo duration CT chest showed Necrotic breast mass with asymmetric nodes and  ?2.3 cm Phillips aortic wall veg + liver and adrenal masses  WBC 14, Hb 8.1 PLt 512 BUN /Creat 25/1.4 CO2 20  K 3.3  General surgery, oncology consulted and underwent salvage mastectomy of the left breast 06/02/15 and wound VAC placed  Past medical history-As per Problem list Chart reviewed as below- oncology  Consultants:  Oncology  Gen surgery  Cardiology  Procedures:  2/3 MRSA screen- neg 2/3 blood x2 - diptheroids (1/2) = contaminant 2/4 blood x2 ngtd 2/3 HIV - neg 2/4 Hep C Ab - neg   Antibiotics:  2/3 and stopped linezolide  2/3 Unasyn   2/3 vancomycin   Subjective   Some cough Just back from TEE tol fluids some cough No fever no chills   Objective    Interim History:   Telemetry: Sinus/sinus tach   Objective: Filed Vitals:   06/06/15 1130 06/06/15 1140 06/06/15 1150 06/06/15 1351  BP:  183/61 175/78 139/81  Pulse: 60 54 58 69  Temp:    98.4 F (36.9 C)  TempSrc:    Oral  Resp: 11 10 10 14   Height:      Weight:      SpO2: 96% 95% 97% 100%    Intake/Output Summary (Last 24 hours) at 06/06/15 1403 Last data filed at 06/06/15 1300  Gross per 24 hour  Intake 2661.67 ml  Output   2575 ml  Net  86.67 ml    Exam:  General: alert, eomi Cardiovascular: s1 s 2no m/r/g Respiratory: clear no added sound Abdomen:  Soft nt nd no rebound nor gaurding Skin Neuro intact  Data Reviewed: Basic Metabolic Panel:  Recent Labs Lab 06/02/15 0120 06/02/15 0540 06/03/15 0237 06/05/15 0433  NA 136 137 136 141  K 4.6 3.3* 4.0 3.3*  CL 103 107 108 109  CO2 19* 20* 18* 24  GLUCOSE 87 113* 183* 85  BUN 29* 25* 13 7  CREATININE 1.41* 1.45* 1.19* 0.98    CALCIUM 8.6* 7.5* 7.9* 7.5*   Liver Function Tests:  Recent Labs Lab 06/02/15 0540 06/03/15 0237  AST 14* 15  ALT 17 15  ALKPHOS 103 63  BILITOT 0.5 0.5  PROT 5.3* 5.1*  ALBUMIN 1.6* 1.6*   No results for input(s): LIPASE, AMYLASE in the last 168 hours. No results for input(s): AMMONIA in the last 168 hours. CBC:  Recent Labs Lab 06/02/15 0120 06/02/15 0540 06/03/15 0237 06/03/15 1125 06/05/15 0433  WBC 17.9* 14.2* 12.2*  --  8.7  NEUTROABS 15.9*  --   --   --  5.4  HGB 9.3* 8.1* 6.6*  --  6.2*  HCT 28.8* 25.0* 20.7* 19.4* 19.4*  MCV 75.0* 75.1* 76.1*  --  76.1*  PLT 553* 512* 399  --  439*   Cardiac Enzymes: No results for input(s): CKTOTAL, CKMB, CKMBINDEX, TROPONINI in the last 168 hours. BNP: Invalid input(s): POCBNP CBG:  Recent Labs Lab 06/02/15 0739 06/03/15 0753 06/04/15 0759 06/05/15 0843 06/06/15 0727  GLUCAP 133* 125* 109* 79 87    Recent Results (from the past 240 hour(s))  Culture, blood (Routine X 2) w Reflex to ID Panel     Status: None   Collection Time: 06/02/15  1:10  AM  Result Value Ref Range Status   Specimen Description BLOOD RIGHT ARM  Final   Special Requests BOTTLES DRAWN AEROBIC AND ANAEROBIC 10ML  Final   Culture  Setup Time   Final    GRAM POSITIVE RODS AEROBIC BOTTLE ONLY CRITICAL RESULT CALLED TO, READ BACK BY AND VERIFIED WITH: Youlanda Roys AT [id number] 06/03/15 BY L BENFIELD    Culture   Final    DIPHTHEROIDS(CORYNEBACTERIUM SPECIES) Standardized susceptibility testing for this organism is not available.    Report Status 06/05/2015 FINAL  Final  Culture, blood (Routine X 2) w Reflex to ID Panel     Status: None (Preliminary result)   Collection Time: 06/02/15  1:15 AM  Result Value Ref Range Status   Specimen Description BLOOD RIGHT HAND  Final   Special Requests AEROBIC BOTTLE ONLY 5ML  Final   Culture NO GROWTH 4 DAYS  Final   Report Status PENDING  Incomplete  Surgical pcr screen     Status: None   Collection Time:  06/02/15  4:35 AM  Result Value Ref Range Status   MRSA, PCR NEGATIVE NEGATIVE Final   Staphylococcus aureus NEGATIVE NEGATIVE Final    Comment:        The Xpert SA Assay (FDA approved for NASAL specimens in patients over 63 years of age), is one component of a comprehensive surveillance program.  Test performance has been validated by [facility] for patients greater than or equal to 30 year old. It is not intended to diagnose infection nor to guide or monitor treatment.   Culture, blood (Routine X 2) w Reflex to ID Panel     Status: None (Preliminary result)   Collection Time: 06/03/15  3:00 PM  Result Value Ref Range Status   Specimen Description BLOOD RIGHT ANTECUBITAL  Final   Special Requests IN PEDIATRIC BOTTLE 3CC  Final   Culture NO GROWTH 3 DAYS  Final   Report Status PENDING  Incomplete  Culture, blood (Routine X 2) w Reflex to ID Panel     Status: None (Preliminary result)   Collection Time: 06/03/15  3:05 PM  Result Value Ref Range Status   Specimen Description BLOOD BLOOD RIGHT HAND  Final   Special Requests IN PEDIATRIC BOTTLE 3CC  Final   Culture NO GROWTH 3 DAYS  Final   Report Status PENDING  Incomplete     Studies:              All Imaging reviewed and is as per above notation   Scheduled Meds: . sodium chloride   Intravenous Once  . ampicillin-sulbactam (UNASYN) IV  3 g Intravenous Q6H  . potassium chloride  20 mEq Oral Daily  . sodium chloride flush  3 mL Intravenous Q12H  . vancomycin  750 mg Intravenous Q12H   Continuous Infusions: . sodium chloride 50 mL/hr at 06/05/15 1648     Assessment/Plan:  1. Fungating mass of breast with widespread local status post salvage mastectomy-Pathology shows Grade III AdenoCa, Invasive with lymphovasc invasion.  Further management re: Cancer work-up to be directed by Oncology and general surgery-Imagine a biopsy will be needed-- CT head shows metastatic disease and therefore we will need further input from  oncology. Plastic surgery input re: plans for closure-appreciate 2. Infected breast mass-continue for now Unasyn as well as vancomycin. One of 2 bottles growing gram-positive cocci in the blood =diptheroids=? a contaminant. Defer to ID further plans-note patient not amenable to blood transfusion therefore will be drawing CBC  every other day 3. Hypokalemia-replace orally Kdur 20-increase to 40 daily 4. Symptomatic anemia, possible acute blood loss component-hemoglobin on admission 9.3-->6.6-->6.2. Potentially related to surgery and also underlying poor production from either anemia of chronic disease as well as cancer status.  Limit continued blood draws-Anemia panel = iron def. patient was given IV iron on 06/03/15.  Hemoccult stools.   5. Ascending aorta 3cm Asc. aorta vegetation-discussed with oncology and cardiology who feel risk of AC in patient who is at risk for anemia out-weight benefit.  Continue long term Abx-appreciate ID input-suspect polymicrobial disease vs malignancy-it is unclear which needing broad coverage.  TEE.  As above 06/06/15.  HIV is neg  6. AKI-Creatinine better trending 1.49-1.1.  continue IV saline 100 cc/hr-->50 cc/hr 06/04/12 as eating and drinking.  monitor labs in the a.m. 7. Metabolic acidosis-No baseline.  Has resolved 8. Homelessness-Social worker to look into this 9. Bradycardia-seesm only at night and  Monitors are reassuring.  Keep on tele for now however   Verneita Griffes, MD  [facility] Pager [phone] 06/06/2015, 2:03 PM    LOS: 4 days

## 2015-06-06 NOTE — H&P (View-Only) (Signed)
I have checked on the scheduling of the TEE. I have been informed that the TEE is scheduled for Tuesday, February 7.  Daryel November, MD

## 2015-06-06 NOTE — Progress Notes (Signed)
  Echocardiogram Echocardiogram Transesophageal has been performed.  Robin Cantrell 06/06/2015, 10:56 AM

## 2015-06-07 ENCOUNTER — Other Ambulatory Visit: Payer: Self-pay | Admitting: Plastic Surgery

## 2015-06-07 ENCOUNTER — Inpatient Hospital Stay (HOSPITAL_COMMUNITY): Payer: Medicare HMO

## 2015-06-07 ENCOUNTER — Encounter (HOSPITAL_COMMUNITY): Payer: Self-pay | Admitting: Cardiovascular Disease

## 2015-06-07 DIAGNOSIS — S21002A Unspecified open wound of left breast, initial encounter: Secondary | ICD-10-CM

## 2015-06-07 DIAGNOSIS — C50112 Malignant neoplasm of central portion of left female breast: Secondary | ICD-10-CM

## 2015-06-07 LAB — CBC WITH DIFFERENTIAL/PLATELET
BASOS ABS: 0 10*3/uL (ref 0.0–0.1)
BASOS PCT: 0 %
EOS PCT: 2 %
Eosinophils Absolute: 0.2 10*3/uL (ref 0.0–0.7)
HCT: 18.3 % — ABNORMAL LOW (ref 36.0–46.0)
Hemoglobin: 5.9 g/dL — CL (ref 12.0–15.0)
Lymphocytes Relative: 22 %
Lymphs Abs: 2.6 10*3/uL (ref 0.7–4.0)
MCH: 24.4 pg — ABNORMAL LOW (ref 26.0–34.0)
MCHC: 32.2 g/dL (ref 30.0–36.0)
MCV: 75.6 fL — AB (ref 78.0–100.0)
MONO ABS: 0.7 10*3/uL (ref 0.1–1.0)
MONOS PCT: 6 %
Neutro Abs: 8.2 10*3/uL — ABNORMAL HIGH (ref 1.7–7.7)
Neutrophils Relative %: 70 %
PLATELETS: 441 10*3/uL — AB (ref 150–400)
RBC: 2.42 MIL/uL — ABNORMAL LOW (ref 3.87–5.11)
RDW: 14.5 % (ref 11.5–15.5)
WBC: 11.7 10*3/uL — ABNORMAL HIGH (ref 4.0–10.5)

## 2015-06-07 LAB — COMPREHENSIVE METABOLIC PANEL
ALT: 18 U/L (ref 14–54)
ANION GAP: 7 (ref 5–15)
AST: 22 U/L (ref 15–41)
Albumin: 1.8 g/dL — ABNORMAL LOW (ref 3.5–5.0)
Alkaline Phosphatase: 63 U/L (ref 38–126)
CHLORIDE: 104 mmol/L (ref 101–111)
CO2: 29 mmol/L (ref 22–32)
Calcium: 7.8 mg/dL — ABNORMAL LOW (ref 8.9–10.3)
Creatinine, Ser: 0.81 mg/dL (ref 0.44–1.00)
GFR calc Af Amer: >60 mL/min (ref >60)
Glucose, Bld: 90 mg/dL (ref 65–99)
POTASSIUM: 3.4 mmol/L — AB (ref 3.5–5.1)
Sodium: 140 mmol/L (ref 135–145)
TOTAL PROTEIN: 5.1 g/dL — AB (ref 6.5–8.1)
Total Bilirubin: 0.7 mg/dL (ref 0.3–1.2)

## 2015-06-07 LAB — GLUCOSE, CAPILLARY: GLUCOSE-CAPILLARY: 126 mg/dL — AB (ref 65–99)

## 2015-06-07 LAB — CULTURE, BLOOD (ROUTINE X 2): Culture: NO GROWTH

## 2015-06-07 MED ORDER — GADOBENATE DIMEGLUMINE 529 MG/ML IV SOLN
13.0000 mL | Freq: Once | INTRAVENOUS | Status: AC | PRN
  Administered 2015-06-07: 13 mL via INTRAVENOUS

## 2015-06-07 MED ORDER — ENSURE ENLIVE PO LIQD
237.0000 mL | Freq: Two times a day (BID) | ORAL | Status: DC
  Administered 2015-06-07 – 2015-06-13 (×6): 237 mL via ORAL

## 2015-06-07 NOTE — Progress Notes (Signed)
Robin Cantrell [id number] DOB: 10/07/1945 DOA: 06/02/2015 PCP: No primary care provider on file.  Brief narrative:  70 y/o ? 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 Currently awaiting tumor board decision and then coordination with plastic surgery regarding further management of the wound  Past medical history-As per Problem list Chart reviewed as below- oncology  Consultants:  Oncology  Gen surgery  Cardiology  Procedures:  2/3 MRSA screen- neg 2/3 blood x2 - diptheroids (1/2) = contaminant 2/4 blood x2 ngtd 2/3 HIV - neg 2/4 Hep C Ab - neg   Antibiotics:  2/3 and stopped linezolide  2/3 Unasyn   2/3 vancomycin   Subjective   Short of breath with activity No nausea no vomiting Eating and drinking fairly Long-time discussion regarding communication with her family if she so desires-she has not spoken to her children about her diagnosis or other family members Emotional support given patient tearful and feels that she has a lot of unresolved issues with her family    Objective    Interim History:   Telemetry: Sinus/sinus tach   Objective: Filed Vitals:   06/06/15 1150 06/06/15 1351 06/06/15 2004 06/07/15 0618  BP: 175/78 139/81 153/70 136/70  Pulse: 58 69 80 73  Temp:  98.4 F (36.9 C) 99.2 F (37.3 C) 98.7 F (37.1 C)  TempSrc:  Oral Oral Oral  Resp: 10 14 19 19   Height:      Weight:      SpO2: 97% 100% 100% 100%    Intake/Output Summary (Last 24 hours) at 06/07/15 1053 Last data filed at 06/07/15 [id number]  Gross per  24 hour  Intake 2907.5 ml  Output   3320 ml  Net -412.5 ml    Exam:  General: alert, eomi Cardiovascular: s1 s 2no m/r/g-wound VAC in place Respiratory: clear no added sound   Data Reviewed: Basic Metabolic Panel:  Recent Labs Lab 06/02/15 0120 06/02/15 0540 06/03/15 0237 06/05/15 0433 06/07/15 0534  NA 136 137 136 141 140  K 4.6 3.3* 4.0 3.3* 3.4*  CL 103 107 108 109 104  CO2 19* 20* 18* 24 29  GLUCOSE 87 113* 183* 85 90  BUN 29* 25* 13 7 <5*  CREATININE 1.41* 1.45* 1.19* 0.98 0.81  CALCIUM 8.6* 7.5* 7.9* 7.5* 7.8*   Liver Function Tests:  Recent Labs Lab 06/02/15 0540 06/03/15 0237 06/07/15 0534  AST 14* 15 22  ALT 17 15 18   ALKPHOS 103 63 63  BILITOT 0.5 0.5 0.7  PROT 5.3* 5.1* 5.1*  ALBUMIN 1.6* 1.6* 1.8*   No results for input(s): LIPASE, AMYLASE in the last 168 hours. No results for input(s): AMMONIA in the last 168 hours. CBC:  Recent Labs Lab 06/02/15 0120 06/02/15 0540 06/03/15 0237 06/03/15 1125 06/05/15 0433 06/07/15 0534  WBC 17.9* 14.2* 12.2*  --  8.7 11.7*  NEUTROABS 15.9*  --   --   --  5.4 8.2*  HGB 9.3* 8.1* 6.6*  --  6.2* 5.9*  HCT 28.8* 25.0* 20.7* 19.4* 19.4* 18.3*  MCV 75.0* 75.1* 76.1*  --  76.1* 75.6*  PLT 553* 512* 399  --  439* 441*   Cardiac Enzymes: No results for input(s): CKTOTAL, CKMB, CKMBINDEX, TROPONINI in the last 168 hours. BNP: Invalid input(s): POCBNP CBG:  Recent Labs Lab 06/03/15 0753 06/04/15 0759 06/05/15 0843 06/06/15 0727 06/07/15 0857  GLUCAP 125* 109* 79 87 126*    Recent Results (from the past 240 hour(s))  Culture, blood (Routine X 2) w Reflex to ID Panel     Status: None   Collection Time: 06/02/15  1:10 AM  Result Value Ref Range Status   Specimen Description BLOOD RIGHT ARM  Final   Special Requests BOTTLES DRAWN AEROBIC AND ANAEROBIC 10ML  Final   Culture  Setup Time   Final    GRAM POSITIVE RODS AEROBIC BOTTLE ONLY CRITICAL RESULT CALLED TO, READ BACK BY AND VERIFIED WITH: Youlanda Roys AT [id number] 06/03/15 BY L BENFIELD    Culture   Final    DIPHTHEROIDS(CORYNEBACTERIUM SPECIES) Standardized susceptibility testing for this organism is not available.    Report Status 06/05/2015 FINAL  Final  Culture, blood (Routine X 2) w Reflex to ID Panel     Status: None (Preliminary result)   Collection Time: 06/02/15  1:15 AM  Result Value Ref Range Status   Specimen Description BLOOD RIGHT HAND  Final   Special Requests AEROBIC BOTTLE ONLY 5ML  Final   Culture NO GROWTH 4 DAYS  Final   Report Status PENDING  Incomplete  Surgical pcr screen     Status: None   Collection Time: 06/02/15  4:35 AM  Result Value Ref Range Status   MRSA, PCR NEGATIVE NEGATIVE Final   Staphylococcus aureus NEGATIVE NEGATIVE Final    Comment:        The Xpert SA Assay (FDA approved for NASAL specimens in patients over 93 years of age), is one component of a comprehensive surveillance program.  Test performance has been validated by [facility] for patients greater than or equal to 29 year old. It is not intended to diagnose infection nor to guide or monitor treatment.   Culture, blood (Routine X 2) w Reflex to ID Panel     Status: None (Preliminary result)   Collection Time: 06/03/15  3:00 PM  Result Value Ref Range Status   Specimen Description BLOOD RIGHT ANTECUBITAL  Final   Special Requests IN PEDIATRIC BOTTLE 3CC  Final   Culture NO GROWTH 3 DAYS  Final   Report Status PENDING  Incomplete  Culture, blood (Routine X 2) w Reflex to ID Panel     Status: None (Preliminary result)   Collection Time: 06/03/15  3:05 PM  Result Value Ref Range Status   Specimen Description BLOOD BLOOD RIGHT HAND  Final   Special Requests IN PEDIATRIC BOTTLE 3CC  Final   Culture NO GROWTH 3 DAYS  Final   Report Status PENDING  Incomplete     Studies:              All Imaging reviewed and is as per above notation   Scheduled Meds: . sodium chloride   Intravenous Once  . ampicillin-sulbactam (UNASYN)  IV  3 g Intravenous Q6H  . potassium chloride  40 mEq Oral Daily  . sodium chloride flush  3 mL Intravenous Q12H  . vancomycin  750 mg Intravenous Q12H   Continuous Infusions: . sodium chloride 50 mL/hr at 06/07/15 0058     Assessment/Plan:  1. Fungating mass of breast with widespread local status post salvage  mastectomy-Pathology shows Grade III AdenoCa, Invasive with lymphovasc invasion.  Further management re: Cancer work-up to be directed by Oncology and general surgery-I will d/w Dr. Marla Roe further 2. Infected breast mass-continue for now Unasyn as well as vancomycin. One of 2 bottles growing gram-positive cocci in the blood =diptheroids=? a contaminant. Defer to ID further plans-note patient not amenable to blood transfusion therefore will be drawing CBC every other day 3. Hypokalemia-replace orally Kdur 20-increase to 40 daily 4. Symptomatic anemia, possible acute blood loss component-hemoglobin on admission 9.3-->6.6-->6.2--5.9. Potentially related to surgery and also underlying poor production from either anemia of chronic disease as well as cancer status.  Limit continued blood draws-Anemia panel = iron def. patient was given IV iron on 06/03/15.  Hemoccult stools. Suspect we can repeat an iron infusion in the next week or so with iron studies. No role for erythropoietin at this stage   5. 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 discussed with oncology and cardiology who feel risk of AC in patient who is at risk for anemia out-weight benefit.  Continue long term Abx-appreciate ID input-suspect polymicrobial disease vs malignancy-it is unclear which. needing broad coverage--duration of antibiotics to be completed probably on 2/17.  -. HIV is neg  6. AKI-Creatinine better trending 1.49-1.1.  continue IV saline 100 cc/hr-->50 cc/hr 06/04/12 as eating and drinking. Saline locked on 2/8. 7. Metabolic acidosis-No baseline.  Has  resolved 8. Homelessness-Social worker to look into this 9. Bradycardia-seems only at night and monitors are reassuring.  D/c tele  Verneita Griffes, MD  [facility] Pager [phone] 06/07/2015, 10:53 AM    LOS: 5 days

## 2015-06-07 NOTE — Progress Notes (Signed)
Events noted. MRI results indicated no mass or metastatic lesions with some vasogenic edema.  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  Starting dexamethasone at 4 mg twice a day would be reasonable to decrease her vasogenic edema and prevents symptoms. Unless there is a contraindication from an infectious disease or surgical standpoint standpoint.

## 2015-06-07 NOTE — Progress Notes (Signed)
Patient ID: Robin Cantrell, female   DOB: [birth year], 70 y.o.   MRN: [id number]     [facility]      [address] [place], [place] [address]    Phone: [phone] FAX: [phone]     Subjective: Denies sob, cp, palpitations or fatigue.  VSS.    Objective:  Vital signs:  Filed Vitals:   06/06/15 1150 06/06/15 1351 06/06/15 2004 06/07/15 0618  BP: 175/78 139/81 153/70 136/70  Pulse: 58 69 80 73  Temp:  98.4 F (36.9 C) 99.2 F (37.3 C) 98.7 F (37.1 C)  TempSrc:  Oral Oral Oral  Resp: 10 14 19 19   Height:      Weight:      SpO2: 97% 100% 100% 100%    Last BM Date: 06/06/15  Intake/Output   Yesterday:  02/07 0701 - 02/08 0700 In: 3535.8 [P.O.:1440; I.V.:2095.8] Out: 3320 [Urine:3250; Drains:70] This shift:    I/O last 3 completed shifts: In: 4227.5 [P.O.:1440; I.V.:2537.5; IV Piggyback:250] Out: 7741 [Urine:5250; Drains:70]    Physical Exam: General: Pt awake/alert/oriented x4 in no acute distress  Skin: VAC in place left breast     Problem List:   Principal Problem:   Infection of left breast Active Problems:   Left breast mass   Breast cancer (HCC)   AKI (acute kidney injury) (HCC)   Breast infection   Homeless   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HCC)   Aorta disorder (HCC)   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 ([place])   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 [facility])    Results:   Labs: Results for orders placed or performed during the hospital encounter of 06/02/15 (from the past 48 hour(s))  Glucose, capillary     Status: None   Collection Time: 06/05/15  8:43 AM  Result Value Ref Range   Glucose-Capillary 79 65 - 99 mg/dL   Comment 1 Notify RN   Glucose, capillary     Status: None   Collection Time: 06/06/15  7:27 AM  Result Value Ref Range   Glucose-Capillary 87 65 - 99 mg/dL  CBC with Differential/Platelet     Status: Abnormal   Collection Time: 06/07/15  5:34 AM  Result Value Ref  Range   WBC 11.7 (H) 4.0 - 10.5 K/uL    Comment: REPEATED TO VERIFY   RBC 2.42 (L) 3.87 - 5.11 MIL/uL   Hemoglobin 5.9 (LL) 12.0 - 15.0 g/dL    Comment: REPEATED TO VERIFY CRITICAL VALUE NOTED.  VALUE IS CONSISTENT WITH PREVIOUSLY REPORTED AND CALLED VALUE.    HCT 18.3 (L) 36.0 - 46.0 %   MCV 75.6 (L) 78.0 - 100.0 fL   MCH 24.4 (L) 26.0 - 34.0 pg   MCHC 32.2 30.0 - 36.0 g/dL   RDW 14.5 11.5 - 15.5 %   Platelets 441 (H) 150 - 400 K/uL    Comment: REPEATED TO VERIFY   Neutrophils Relative % 70 %   Neutro Abs 8.2 (H) 1.7 - 7.7 K/uL   Lymphocytes Relative 22 %   Lymphs Abs 2.6 0.7 - 4.0 K/uL   Monocytes Relative 6 %   Monocytes Absolute 0.7 0.1 - 1.0 K/uL   Eosinophils Relative 2 %   Eosinophils Absolute 0.2 0.0 - 0.7 K/uL   Basophils Relative 0 %   Basophils Absolute 0.0 0.0 - 0.1 K/uL  Comprehensive metabolic panel     Status: Abnormal   Collection Time: 06/07/15  5:34 AM  Result Value  Ref Range   Sodium 140 135 - 145 mmol/L   Potassium 3.4 (L) 3.5 - 5.1 mmol/L   Chloride 104 101 - 111 mmol/L   CO2 29 22 - 32 mmol/L   Glucose, Bld 90 65 - 99 mg/dL   BUN <5 (L) 6 - 20 mg/dL   Creatinine, Ser 0.81 0.44 - 1.00 mg/dL   Calcium 7.8 (L) 8.9 - 10.3 mg/dL   Total Protein 5.1 (L) 6.5 - 8.1 g/dL   Albumin 1.8 (L) 3.5 - 5.0 g/dL   AST 22 15 - 41 U/L   ALT 18 14 - 54 U/L   Alkaline Phosphatase 63 38 - 126 U/L   Total Bilirubin 0.7 0.3 - 1.2 mg/dL   GFR calc non Af Amer >60 >60 mL/min   GFR calc Af Amer >60 >60 mL/min    Comment: (NOTE) The eGFR has been calculated using the CKD EPI equation. This calculation has not been validated in all clinical situations. eGFR's persistently <60 mL/min signify possible Chronic Kidney Disease.    Anion gap 7 5 - 15    Imaging / Studies: No results found.  Medications / Allergies:  Scheduled Meds: . sodium chloride   Intravenous Once  . ampicillin-sulbactam (UNASYN) IV  3 g Intravenous Q6H  . potassium chloride  40 mEq Oral Daily  .  sodium chloride flush  3 mL Intravenous Q12H  . vancomycin  750 mg Intravenous Q12H   Continuous Infusions: . sodium chloride 50 mL/hr at 06/07/15 0058   PRN Meds:.acetaminophen **OR** acetaminophen, alum & mag hydroxide-simeth, morphine injection, ondansetron **OR** ondansetron (ZOFRAN) IV, oxyCODONE-acetaminophen  Antibiotics: Anti-infectives    Start     Dose/Rate Route Frequency Ordered Stop   06/05/15 1400  vancomycin (VANCOCIN) IVPB 750 mg/150 ml premix     750 mg 150 mL/hr over 60 Minutes Intravenous Every 12 hours 06/05/15 1258     06/03/15 0000  vancomycin (VANCOCIN) IVPB 1000 mg/200 mL premix  Status:  Discontinued     1,000 mg 200 mL/hr over 60 Minutes Intravenous Every 24 hours 06/02/15 2350 06/05/15 1258   06/02/15 2000  linezolid (ZYVOX) tablet 600 mg  Status:  Discontinued     600 mg Oral Every 12 hours 06/02/15 0454 06/02/15 2310   06/02/15 0600  linezolid (ZYVOX) IVPB 600 mg     600 mg 300 mL/hr over 60 Minutes Intravenous  Once 06/02/15 0454 06/02/15 0958   06/02/15 0600  Ampicillin-Sulbactam (UNASYN) 3 g in sodium chloride 0.9 % 100 mL IVPB    Comments:  Unasyn 3 g IV q6h for CrCl > 30 mL/min   3 g 100 mL/hr over 60 Minutes Intravenous Every 6 hours 06/02/15 0458     06/02/15 0100  vancomycin (VANCOCIN) 1,500 mg in sodium chloride 0.9 % 500 mL IVPB     1,500 mg 250 mL/hr over 120 Minutes Intravenous  Once 06/02/15 0055 06/02/15 0502   06/02/15 0100  piperacillin-tazobactam (ZOSYN) IVPB 3.375 g     3.375 g 100 mL/hr over 30 Minutes Intravenous  Once 06/02/15 0055 06/02/15 0202      Assessment/Plan Necrotic left breast mass  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  -Ambulate and IS -SCD's and okay to be on heparin/lovenox from our standpoint -VAC change M/W/F -Plastic surgery consult ID-Unasyn  and Vancomycin per ID Day #4 Possible vegetation in ascending  aorta-TEE shows mass v thrombus v vegetation  AKI-resolved Anemia-hgb 5.9, refusing blood tx, hemodynamically stable. Iron Tx.  Primary team managing  FEN-no issues   Disp-patient is homeless which will greatly complicate her recovery.  Per primary team     Erby Pian, [facility] Pager [phone](7A-4:30P) For consults and floor pages call [phone](7A-4:30P)  06/07/2015 8:20 AM

## 2015-06-07 NOTE — Progress Notes (Signed)
OT Cancellation Note  Patient Details Name: Robin Cantrell MRN: [id number] DOB: [birth year]   Cancelled Treatment:    Reason Eval/Treat Not Completed: Other (comment) (Doctor in talking with patient). Therapist waiting outside of room for awhile. Will check back as schedule allows for OT treatment, possibly tomorrow.  Chrys Racer , MS, OTR/L, CLT Pager: [phone]  06/07/2015, 10:40 AM

## 2015-06-07 NOTE — Progress Notes (Signed)
Nutrition Follow-up  DOCUMENTATION CODES:   Not applicable  INTERVENTION:   -Ensure Enlive po BID, each supplement provides 350 kcal and 20 grams of protein -Continue MVI, Vitamin C, and Zinc supplementation  NUTRITION DIAGNOSIS:   Increased nutrient needs related to wound healing as evidenced by estimated needs.  Ongoing  GOAL:   Patient will meet greater than or equal to 90% of their needs  Progressing  MONITOR:   Diet advancement, Supplement acceptance, PO intake, Skin, I & O's  REASON FOR ASSESSMENT:   Malnutrition Screening Tool    ASSESSMENT:   Pt who is homeless admitted with left breast pain which started as a boil about 5 months ago. 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   S/p Procedure on 06/02/15:  Left mastectomy with placement of wound VAC (60 cm2)  Attempted to speak with pt multiple times, however, in with MD, RN, or taken down to MRI at times of multiple visits.   Pt is on a 2 grams sodium diet with good intake; meal completion 25-100%. Pt has increased needs, related to cancer diagnosis and wound healing. RD to add supplements to promote healing.   TEE on 06/06/15 revealed pedunculated, mobile mass in the ascending aorta and a mobile mass in the pericardial space inferior to the left atrial appendage. 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   Labs reviewed: K: 3.4 (on PO supplementation), CBGS: 79-126.   Diet Order:  Diet 2 gram sodium Room service appropriate?: Yes; Fluid consistency:: Thin  Skin:  Wound (see comment) (wound VAC to lt breast)  Last BM:  06/06/15  Height:   Ht Readings from Last 1 Encounters:  06/02/15 5\' 8"  (1.727 m)    Weight:   Wt Readings from Last 1 Encounters:  06/02/15 139 lb 12.4 oz (63.4 kg)    Ideal Body  Weight:  63.6 kg  BMI:  Body mass index is 21.26 kg/(m^2).  Estimated Nutritional Needs:   Kcal:  1800-2000  Protein:  95-115 grams  Fluid:  > 1.8 L/day  EDUCATION NEEDS:   No education needs identified at this time  Amiaya Mcneeley A. Jimmye Norman, RD, LDN, CDE Pager: [phone] After hours Pager: [phone]

## 2015-06-07 NOTE — Progress Notes (Signed)
Occupational Therapy Treatment Patient Details Name: Robin Cantrell MRN: [id number] DOB: [birth year] Today's Date: 06/07/2015    History of present illness patient is a 70 yo female who presents with Infection of left breast, possible left breast cancer and vegetation in ascending aorta.  Pt s/p L radical mastectomy and wound vac placement on 06/02/15 and is thought to have mets (liver and adrenal masses), possibly to the brain.  Oncology following.     OT comments  Patient making slow, steady progress towards OT goals. Continue plan of care for now. Unsure of patient's cognitive baseline. Pt adamant not to tell family what's going on at this time. Pt will remains appropriate for ST SNF at this time.    Follow Up Recommendations  SNF;Supervision/Assistance - 24 hour    Equipment Recommendations  Other (comment) (TBD)    Recommendations for Other Services  None at this time   Precautions / Restrictions Precautions Precautions: Fall Precaution Comments: wound vac Restrictions Weight Bearing Restrictions: No    Mobility Bed Mobility Overal bed mobility: Needs Assistance Bed Mobility: Supine to Sit     Supine to sit: Supervision     General bed mobility comments: supervision for safety  Transfers Overall transfer level: Needs assistance Equipment used: None Transfers: Sit to/from Stand Sit to Stand: Supervision General transfer comment: Supervision for safety. Assistance needed for mobility due to lines/leads    Balance Overall balance assessment: Needs assistance Sitting-balance support: No upper extremity supported;Feet supported Sitting balance-Leahy Scale: Good     Standing balance support: No upper extremity supported;During functional activity Standing balance-Leahy Scale: Fair   ADL Overall ADL's : Needs assistance/impaired General ADL Comments: Pt found supine in bed and with min encouragement was willing to work with therapist. Pt engaged in bed mobility, min  assist needed due to lines/leads. Pt ambulated into BR for toilet transfer and grooming task standing at sink. Pt then ambulated to recliner and therapist engaged pt in some LUE AROM exercises, see general UE for more information regarding this.      Cognition   Behavior During Therapy: WFL for tasks assessed/performed Overall Cognitive Status: Impaired/Different from baseline Area of Impairment: Memory;Following commands;Safety/judgement;Awareness;Problem solving     Memory: Decreased short-term memory  Following Commands: Follows one step commands with increased time;Follows multi-step commands inconsistently Safety/Judgement: Decreased awareness of safety;Decreased awareness of deficits Awareness: Emergent Problem Solving: Difficulty sequencing;Requires verbal cues;Requires tactile cues General Comments: Pt's cognition seems to have gotten somewhat better. Unsure of her baseline. Pt discussed with this therapist that she does not plan to tell her family what is going on "they will make it into too big of a deal".       Exercises General Exercises - Upper Extremity Shoulder Flexion:  (unable due to recent surgery and wound vac) Elbow Flexion: AROM;Left;10 reps;Seated Elbow Extension: AROM;Left;10 reps;Seated Wrist Flexion: AROM;Left;10 reps;Seated Wrist Extension: AROM;10 reps;Left;Seated Digit Composite Flexion: AROM;Left;10 reps;Seated Composite Extension: AROM;Left;10 reps;Seated           Pertinent Vitals/ Pain       Pain Assessment: Faces Faces Pain Scale: Hurts little more Pain Location: LUE Pain Descriptors / Indicators: Sore;Guarding;Discomfort Pain Intervention(s): Limited activity within patient's tolerance;Monitored during session;Repositioned (LUE placed on pillow at end of session)   Frequency Min 2X/week     Progress Toward Goals  OT Goals(current goals can now befound in the care plan section)  Progress towards OT goals: Progressing toward goals  Acute  Rehab OT Goals Patient Stated Goal: to get better OT  Goal Formulation: With patient Time For Goal Achievement: 06/19/15 Potential to Achieve Goals: Good  Plan Discharge plan remains appropriate    End of Session Equipment Utilized During Treatment: Gait belt   Activity Tolerance Patient tolerated treatment well   Patient Left in chair;with call bell/phone within reach  Nurse Communication Mobility status     Time: [id number] OT Time Calculation (min): 26 min  Charges: OT General Charges $OT Visit: 1 Procedure OT Treatments $Self Care/Home Management : 23-37 mins  Chrys Racer , MS, OTR/L, CLT Pager: [phone]  06/07/2015, 3:43 PM

## 2015-06-07 NOTE — Clinical Documentation Improvement (Signed)
General Surgery Internal Medicine Oncology  Based on the clinical findings below, please document any associated diagnoses/conditions the patient has or may have.   Vasogenic edema  Other  Clinically Undetermined  Supporting Information: -- CT head done 2/3 states "Associated vasogenic edema" -- Plan MRI brain to eval brain metastases both hemispheres  Please exercise your independent, professional judgment when responding. A specific answer is not anticipated or expected.  Thank You, Ezekiel Ina RN [place] [phone]

## 2015-06-08 ENCOUNTER — Inpatient Hospital Stay (HOSPITAL_COMMUNITY): Payer: Medicare HMO | Admitting: Anesthesiology

## 2015-06-08 ENCOUNTER — Encounter (HOSPITAL_COMMUNITY): Payer: Self-pay | Admitting: Plastic Surgery

## 2015-06-08 ENCOUNTER — Encounter (HOSPITAL_COMMUNITY)
Admission: EM | Disposition: A | Discharge status: Skilled Nursing Facility | Payer: Self-pay | Source: Home / Self Care | Attending: Family Medicine

## 2015-06-08 ENCOUNTER — Ambulatory Visit
Admit: 2015-06-08 | Discharge: 2015-06-08 | Disposition: A | Discharge status: Home / Self Care | Payer: Medicare HMO | Attending: Radiation Oncology | Admitting: Radiation Oncology

## 2015-06-08 DIAGNOSIS — C50812 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: Secondary | ICD-10-CM

## 2015-06-08 DIAGNOSIS — C50919 Malignant neoplasm of unspecified site of unspecified female breast: Secondary | ICD-10-CM

## 2015-06-08 DIAGNOSIS — C799 Secondary malignant neoplasm of unspecified site: Principal | ICD-10-CM

## 2015-06-08 HISTORY — PX: APPLICATION OF WOUND VAC: SHX5189

## 2015-06-08 HISTORY — PX: INCISION AND DRAINAGE OF WOUND: SHX1803

## 2015-06-08 HISTORY — PX: APPLICATION OF A-CELL OF CHEST/ABDOMEN: SHX6302

## 2015-06-08 LAB — CULTURE, BLOOD (ROUTINE X 2)
Culture: NO GROWTH
Culture: NO GROWTH

## 2015-06-08 LAB — PREPARE RBC (CROSSMATCH)

## 2015-06-08 LAB — GLUCOSE, CAPILLARY: GLUCOSE-CAPILLARY: 98 mg/dL (ref 65–99)

## 2015-06-08 SURGERY — IRRIGATION AND DEBRIDEMENT WOUND
Anesthesia: General | Site: Breast | Laterality: Left

## 2015-06-08 MED ORDER — BOOST / RESOURCE BREEZE PO LIQD
1.0000 | Freq: Three times a day (TID) | ORAL | Status: DC
  Administered 2015-06-08 – 2015-06-09 (×3): 1 via ORAL
  Administered 2015-06-10: 0.9875 via ORAL
  Administered 2015-06-10 – 2015-06-13 (×4): 1 via ORAL

## 2015-06-08 MED ORDER — EPHEDRINE SULFATE 50 MG/ML IJ SOLN
INTRAMUSCULAR | Status: DC | PRN
  Administered 2015-06-08: 10 mg via INTRAVENOUS
  Administered 2015-06-08: 5 mg via INTRAVENOUS

## 2015-06-08 MED ORDER — LIDOCAINE-EPINEPHRINE 1 %-1:100000 IJ SOLN
INTRAMUSCULAR | Status: AC
  Filled 2015-06-08: qty 1

## 2015-06-08 MED ORDER — LACTATED RINGERS IV SOLN: INTRAVENOUS | Status: DC

## 2015-06-08 MED ORDER — EPHEDRINE SULFATE 50 MG/ML IJ SOLN
INTRAMUSCULAR | Status: AC
  Filled 2015-06-08: qty 2

## 2015-06-08 MED ORDER — MIDAZOLAM HCL 2 MG/2ML IJ SOLN
INTRAMUSCULAR | Status: AC
  Filled 2015-06-08: qty 2

## 2015-06-08 MED ORDER — ONDANSETRON HCL 4 MG/2ML IJ SOLN
INTRAMUSCULAR | Status: DC | PRN
  Administered 2015-06-08: 4 mg via INTRAVENOUS

## 2015-06-08 MED ORDER — PROMETHAZINE HCL 25 MG/ML IJ SOLN
6.2500 mg | INTRAMUSCULAR | Status: DC | PRN
Start: 2015-06-08 — End: 2015-06-08

## 2015-06-08 MED ORDER — LACTATED RINGERS IV SOLN
INTRAVENOUS | Status: DC
  Administered 2015-06-08: 10:00:00 via INTRAVENOUS

## 2015-06-08 MED ORDER — LIDOCAINE HCL (CARDIAC) 20 MG/ML IV SOLN
INTRAVENOUS | Status: DC | PRN
  Administered 2015-06-08: 60 mg via INTRAVENOUS

## 2015-06-08 MED ORDER — FENTANYL CITRATE (PF) 100 MCG/2ML IJ SOLN: 25.0000 ug | INTRAMUSCULAR | Status: DC | PRN

## 2015-06-08 MED ORDER — SODIUM CHLORIDE 0.9 % IR SOLN
Status: DC | PRN
  Administered 2015-06-08: 500 mL

## 2015-06-08 MED ORDER — SODIUM CHLORIDE 0.9 % IR SOLN
Status: DC | PRN
  Administered 2015-06-08: 1000 mL

## 2015-06-08 MED ORDER — FENTANYL CITRATE (PF) 250 MCG/5ML IJ SOLN
INTRAMUSCULAR | Status: AC
  Filled 2015-06-08: qty 5

## 2015-06-08 MED ORDER — SODIUM CHLORIDE 0.9 % IV SOLN
Freq: Once | INTRAVENOUS | Status: AC
  Administered 2015-06-08: 20:00:00 via INTRAVENOUS

## 2015-06-08 MED ORDER — PROPOFOL 10 MG/ML IV BOLUS
INTRAVENOUS | Status: DC | PRN
  Administered 2015-06-08: 180 mg via INTRAVENOUS

## 2015-06-08 MED ORDER — MIDAZOLAM HCL 5 MG/5ML IJ SOLN
INTRAMUSCULAR | Status: DC | PRN
  Administered 2015-06-08: 2 mg via INTRAVENOUS

## 2015-06-08 MED ORDER — SODIUM CHLORIDE 0.9 % IJ SOLN
INTRAMUSCULAR | Status: AC
  Filled 2015-06-08: qty 20

## 2015-06-08 MED ORDER — BUPIVACAINE HCL (PF) 0.25 % IJ SOLN
INTRAMUSCULAR | Status: AC
  Filled 2015-06-08: qty 30

## 2015-06-08 MED ORDER — FENTANYL CITRATE (PF) 100 MCG/2ML IJ SOLN
INTRAMUSCULAR | Status: DC | PRN
  Administered 2015-06-08 (×2): 50 ug via INTRAVENOUS

## 2015-06-08 MED ORDER — MEPERIDINE HCL 25 MG/ML IJ SOLN: 6.2500 mg | INTRAMUSCULAR | Status: DC | PRN

## 2015-06-08 SURGICAL SUPPLY — 64 items
APPLIER CLIP 11 MED OPEN (CLIP) ×3
BAG DECANTER FOR FLEXI CONT (MISCELLANEOUS) IMPLANT
BENZOIN TINCTURE PRP APPL 2/3 (GAUZE/BANDAGES/DRESSINGS) ×3 IMPLANT
BINDER BREAST MEDIUM (GAUZE/BANDAGES/DRESSINGS) ×3 IMPLANT
BIOPATCH RED 1 DISK 7.0 (GAUZE/BANDAGES/DRESSINGS) ×2 IMPLANT
BIOPATCH RED 1IN DISK 7.0MM (GAUZE/BANDAGES/DRESSINGS) ×1
BLADE 10 SAFETY STRL DISP (BLADE) ×3 IMPLANT
BURN MATRIX MATRISTEM 10X15 (Tissue) ×3 IMPLANT
CANISTER SUCTION 2500CC (MISCELLANEOUS) ×3 IMPLANT
CANISTER WOUND CARE 500ML ATS (WOUND CARE) ×3 IMPLANT
CLIP APPLIE 11 MED OPEN (CLIP) ×1 IMPLANT
CONT SPEC STER OR (MISCELLANEOUS) IMPLANT
COVER SURGICAL LIGHT HANDLE (MISCELLANEOUS) ×3 IMPLANT
CUTIMED SORBACT ×2 IMPLANT
DRAPE IMP U-DRAPE 54X76 (DRAPES) ×3 IMPLANT
DRAPE INCISE IOBAN 66X45 STRL (DRAPES) IMPLANT
DRAPE LAPAROSCOPIC ABDOMINAL (DRAPES) IMPLANT
DRAPE PED LAPAROTOMY (DRAPES) ×3 IMPLANT
DRAPE PROXIMA HALF (DRAPES) IMPLANT
DRSG ADAPTIC 3X8 NADH LF (GAUZE/BANDAGES/DRESSINGS) IMPLANT
DRSG EMULSION OIL 3X3 NADH (GAUZE/BANDAGES/DRESSINGS) IMPLANT
DRSG PAD ABDOMINAL 8X10 ST (GAUZE/BANDAGES/DRESSINGS) ×3 IMPLANT
DRSG VAC ATS LRG SENSATRAC (GAUZE/BANDAGES/DRESSINGS) IMPLANT
DRSG VAC ATS MED SENSATRAC (GAUZE/BANDAGES/DRESSINGS) ×3 IMPLANT
DRSG VAC ATS SM SENSATRAC (GAUZE/BANDAGES/DRESSINGS) IMPLANT
ELECT CAUTERY BLADE 6.4 (BLADE) ×3 IMPLANT
ELECT REM PT RETURN 9FT ADLT (ELECTROSURGICAL) ×3
ELECTRODE REM PT RTRN 9FT ADLT (ELECTROSURGICAL) ×1 IMPLANT
EVACUATOR SILICONE 100CC (DRAIN) ×3 IMPLANT
GAUZE SPONGE 4X4 12PLY STRL (GAUZE/BANDAGES/DRESSINGS) IMPLANT
GAUZE SPONGE 4X4 16PLY XRAY LF (GAUZE/BANDAGES/DRESSINGS) ×3 IMPLANT
GLOVE BIO SURGEON STRL SZ 6.5 (GLOVE) ×2 IMPLANT
GLOVE BIO SURGEONS STRL SZ 6.5 (GLOVE) ×1
GOWN STRL REUS W/ TWL LRG LVL3 (GOWN DISPOSABLE) ×3 IMPLANT
GOWN STRL REUS W/TWL LRG LVL3 (GOWN DISPOSABLE) ×6
H R LUBE JELLY XXX (MISCELLANEOUS) ×6 IMPLANT
KIT BASIN OR (CUSTOM PROCEDURE TRAY) ×3 IMPLANT
KIT ROOM TURNOVER OR (KITS) ×3 IMPLANT
MICROMATRIX 1000MG (Tissue) ×9 IMPLANT
NS IRRIG 1000ML POUR BTL (IV SOLUTION) ×3 IMPLANT
PACK GENERAL/GYN (CUSTOM PROCEDURE TRAY) ×3 IMPLANT
PACK SURGICAL SETUP 50X90 (CUSTOM PROCEDURE TRAY) ×3 IMPLANT
PACK UNIVERSAL I (CUSTOM PROCEDURE TRAY) ×3 IMPLANT
PAD ARMBOARD 7.5X6 YLW CONV (MISCELLANEOUS) ×6 IMPLANT
PENCIL BUTTON HOLSTER BLD 10FT (ELECTRODE) ×3 IMPLANT
RESTORE EXTRA THIN HYDROCOLLOID DRESSING 4IN X 4 IN ×3 IMPLANT
SOLUTION PARTIC MCRMTRX 1000MG (Tissue) ×3 IMPLANT
SPONGE GAUZE 4X4 12PLY STER LF (GAUZE/BANDAGES/DRESSINGS) ×3 IMPLANT
STAPLER VISISTAT 35W (STAPLE) ×3 IMPLANT
SURGILUBE 2OZ TUBE FLIPTOP (MISCELLANEOUS) IMPLANT
SUT CHROMIC 4 0 P 3 18 (SUTURE) IMPLANT
SUT ETHILON 4 0 PS 2 18 (SUTURE) ×21 IMPLANT
SUT ETHILON 5 0 P 3 18 (SUTURE)
SUT MNCRL AB 3-0 PS2 18 (SUTURE) ×3 IMPLANT
SUT NYLON ETHILON 5-0 P-3 1X18 (SUTURE) IMPLANT
SUT SILK 4 0 PS 2 (SUTURE) ×3 IMPLANT
SUT VIC AB 5-0 PS2 18 (SUTURE) ×9 IMPLANT
SWAB COLLECTION DEVICE MRSA (MISCELLANEOUS) IMPLANT
SYR BULB 3OZ (MISCELLANEOUS) IMPLANT
SYR BULB IRRIGATION 50ML (SYRINGE) ×3 IMPLANT
TOWEL OR 17X24 6PK STRL BLUE (TOWEL DISPOSABLE) ×3 IMPLANT
TOWEL OR 17X26 10 PK STRL BLUE (TOWEL DISPOSABLE) ×3 IMPLANT
TUBE ANAEROBIC SPECIMEN COL (MISCELLANEOUS) IMPLANT
UNDERPAD 30X30 INCONTINENT (UNDERPADS AND DIAPERS) ×3 IMPLANT

## 2015-06-08 NOTE — Care Management Important Message (Signed)
Important Message  Patient Details  Name: Robin Cantrell MRN: [id number] Date of Birth: February 14, 1946   Medicare Important Message Given:  Yes    [name] 06/08/2015, 8:39 AM

## 2015-06-08 NOTE — Transfer of Care (Signed)
Immediate Anesthesia Transfer of Care Note  Patient: Robin Cantrell  Procedure(s) Performed: Procedure(s): IRRIGATION AND DEBRIDEMENT LEFT BREAST WOUND WITH A CELL PLACEMENT AND VAC (Left) WITH A CELL PLACEMENT (Left) AND VAC (Left)  Patient Location: PACU  Anesthesia Type:General  Level of Consciousness: awake, alert  and oriented  Airway & Oxygen Therapy: Patient Spontanous Breathing and Patient connected to nasal cannula oxygen  Post-op Assessment: Report given to RN, Post -op Vital signs reviewed and stable and Patient moving all extremities  Post vital signs: Reviewed and stable  Last Vitals:  Filed Vitals:   06/08/15 0618 06/08/15 0800  BP: 132/74 136/73  Pulse: 74 77  Temp: 36.8 C 36.9 C  Resp: 18 18    Complications: No apparent anesthesia complications

## 2015-06-08 NOTE — Interval H&P Note (Signed)
History and Physical Interval Note:  06/08/2015 7:21 AM  Robin Cantrell  has presented today for surgery, with the diagnosis of left breast wound  The various methods of treatment have been discussed with the patient and family. After consideration of risks, benefits and other options for treatment, the patient has consented to  Procedure(s): IRRIGATION AND DEBRIDEMENT LEFT BREAST WOUND WITH A CELL PLACEMENT AND VAC (Left) WITH A CELL PLACEMENT (Left) AND VAC (Left) as a surgical intervention .  The patient's history has been reviewed, patient examined, no change in status, stable for surgery.  I have reviewed the patient's chart and labs.  Questions were answered to the patient's satisfaction.     Wallace Going

## 2015-06-08 NOTE — Progress Notes (Addendum)
[facility] PROGRESS NOTE  Robin Cantrell [id number] DOB: Mar 28, 1946 DOA: 06/02/2015 PCP: No primary care provider on file.  brief narrative 70 year old African-American female with no past medical history, currently homeless living in her truck who was admitted on 06/01/2015 with fungating left breast lesion with purulent drainage. CT of the chest showed necrotic breast mass with axillary lymph nodes with liver and adrenal masses suspicious of malignancy. Patient reported having fevers with chills for almost 20 pound weight loss over past few months. [facility] oncology consulted and patient underwent salvage mastectomy of the left breast on 06/02/2015 with wound VAC placed. 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 MRI brain showing numerous bilateral cerebral metastases with mild edema.    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 Grade 3 adenocarcinoma on pathology with lymphovascular invasion. Oncology following and discussing with tumor board. Radiation oncology to be consulted for brain radiation. Started on Decadron. Patient underwent incision and debridement of the left breast wound today with A CELL  placement and VAC. Plastic surgery following. Continue Wound  care.   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 Continue vancomycin and Unasyn. 1/2 bottles growing gram-positive cocci on admission (likely a contaminant with diphtheroids). Repeat cultures negative. TEE shows 2 cm mobile pedunculated mass in the ascending aorta. Unclear if this is infectious vegetation versus associated with malignancy. ID recommend to continue current antibiotics with plan on treatment for 14 days and is to by mouth upon discharge.. TEE unremarkable for any vegetations. HIV and hep C antibody  negative.  Symptomatic microcytic anemia Progressive drop in hemoglobin (9.3 on admission--> 5.9). Will transfuse 2 units PRBC today. (As per nurses and was apparently refusing blood transfusions). Treated with transfusion today.  Iron panel suggest iron deficiency. Received IV iron on 2/4.  Severe protein calorie malnutrition Continue supplements.  Hypokalemia Replenished  DVT prophylaxis: SCDs Diet: Regular   Code Status: Full code Family Communication: son at bedside. Pt allow to discuss plan with her son Disposition Plan: continue inpt care   Consultants:  plastic Surgery  Oncology  ID  Procedures:  Salvage mastectomy 2/3  CT chest  MRI brain  TEE  Irrigation and debridement of left breast wound with A CELL placement and VAC  Antibiotics:  IV vancomycin and unasyn 2/3  HPI/Subjective: Seen and examined after returning from water. Denies any specific symptoms.  Objective: Filed Vitals:   06/08/15 1434 06/08/15 1500  BP: 145/86 169/80  Pulse: 71 116  Temp: 97.7 F (36.5 C) 98 F (36.7 C)  Resp: 11     Intake/Output Summary (Last 24 hours) at 06/08/15 1522 Last data filed at 06/08/15 1434  Gross per 24 hour  Intake   1570 ml  Output   2406 ml  Net   -836 ml   Filed Weights   06/02/15 0445  Weight: 63.4 kg (139 lb 12.4 oz)    Exam:   General:  Elderly thin built female in no acute distress  HEENT: Pallor present, moist mucosa  Chest: Dressing over left breast flap with wound VAC in place  CVS: Normal S1 and S2, no murmurs rubs or gallop  GI: Soft, nondistended, nontender, bowel sounds present  Musculoskeletal: Warm, no edema  CNS: Alert and oriented   Data Reviewed: Basic Metabolic Panel:  Recent Labs Lab 06/02/15 0120 06/02/15 0540 06/03/15 [id number] 06/05/15 [id number] 06/07/15 [id number]  NA 136 137 136 141 140  K 4.6 3.3* 4.0 3.3* 3.4*  CL 103 107 108 109 104  CO2 19* 20* 18* 24 29  GLUCOSE 87 113* 183* 85 90  BUN 29* 25* 13 7  <5*  CREATININE 1.41* 1.45* 1.19* 0.98 0.81  CALCIUM 8.6* 7.5* 7.9* 7.5* 7.8*   Liver Function Tests:  Recent Labs Lab 06/02/15 0540 06/03/15 0237 06/07/15 0534  AST 14* 15 22  ALT 17 15 18   ALKPHOS 103 63 63  BILITOT 0.5 0.5 0.7  PROT 5.3* 5.1* 5.1*  ALBUMIN 1.6* 1.6* 1.8*   No results for input(s): LIPASE, AMYLASE in the last 168 hours. No results for input(s): AMMONIA in the last 168 hours. CBC:  Recent Labs Lab 06/02/15 0120 06/02/15 0540 06/03/15 0237 06/03/15 1125 06/05/15 0433 06/07/15 0534  WBC 17.9* 14.2* 12.2*  --  8.7 11.7*  NEUTROABS 15.9*  --   --   --  5.4 8.2*  HGB 9.3* 8.1* 6.6*  --  6.2* 5.9*  HCT 28.8* 25.0* 20.7* 19.4* 19.4* 18.3*  MCV 75.0* 75.1* 76.1*  --  76.1* 75.6*  PLT 553* 512* 399  --  439* 441*   Cardiac Enzymes: No results for input(s): CKTOTAL, CKMB, CKMBINDEX, TROPONINI in the last 168 hours. BNP (last 3 results) No results for input(s): BNP in the last 8760 hours.  ProBNP (last 3 results) No results for input(s): PROBNP in the last 8760 hours.  CBG:  Recent Labs Lab 06/04/15 0759 06/05/15 0843 06/06/15 0727 06/07/15 0857 06/08/15 0721  GLUCAP 109* 79 87 126* 98    Recent Results (from the past 240 hour(s))  Culture, blood (Routine X 2) w Reflex to ID Panel     Status: None   Collection Time: 06/02/15  1:10 AM  Result Value Ref Range Status   Specimen Description BLOOD RIGHT ARM  Final   Special Requests BOTTLES DRAWN AEROBIC AND ANAEROBIC 10ML  Final   Culture  Setup Time   Final    GRAM POSITIVE RODS AEROBIC BOTTLE ONLY CRITICAL RESULT CALLED TO, READ BACK BY AND VERIFIED WITH: Youlanda Roys AT [id number] 06/03/15 BY L BENFIELD    Culture   Final    DIPHTHEROIDS(CORYNEBACTERIUM SPECIES) Standardized susceptibility testing for this organism is not available.    Report Status 06/05/2015 FINAL  Final  Culture, blood (Routine X 2) w Reflex to ID Panel     Status: None   Collection Time: 06/02/15  1:15 AM  Result Value Ref  Range Status   Specimen Description BLOOD RIGHT HAND  Final   Special Requests AEROBIC BOTTLE ONLY 5ML  Final   Culture NO GROWTH 5 DAYS  Final   Report Status 06/07/2015 FINAL  Final  Surgical pcr screen     Status: None   Collection Time: 06/02/15  4:35 AM  Result Value Ref Range Status   MRSA, PCR NEGATIVE NEGATIVE Final   Staphylococcus aureus NEGATIVE NEGATIVE Final    Comment:        The Xpert SA Assay (FDA approved for NASAL specimens in patients over 91 years of age), is one component of a comprehensive surveillance program.  Test performance has been validated by [facility] for patients greater than or equal to 41 year old. It is not intended to diagnose infection nor to guide or monitor treatment.   Culture, blood (Routine X 2) w Reflex to ID Panel     Status: None (Preliminary result)   Collection Time: 06/03/15  3:00  PM  Result Value Ref Range Status   Specimen Description BLOOD RIGHT ANTECUBITAL  Final   Special Requests IN PEDIATRIC BOTTLE 3CC  Final   Culture NO GROWTH 4 DAYS  Final   Report Status PENDING  Incomplete  Culture, blood (Routine X 2) w Reflex to ID Panel     Status: None (Preliminary result)   Collection Time: 06/03/15  3:05 PM  Result Value Ref Range Status   Specimen Description BLOOD BLOOD RIGHT HAND  Final   Special Requests IN PEDIATRIC BOTTLE 3CC  Final   Culture NO GROWTH 4 DAYS  Final   Report Status PENDING  Incomplete     Studies: Mr Kizzie Fantasia Contrast  06-08-15  CLINICAL DATA:  Newly diagnosed left breast cancer status post mastectomy on 06/02/2015. Staging. Brain metastases on recent head CT. EXAM: MRI HEAD WITHOUT AND WITH CONTRAST TECHNIQUE: Multiplanar, multiecho pulse sequences of the brain and surrounding structures were obtained without and with intravenous contrast. CONTRAST:  49mL MULTIHANCE GADOBENATE DIMEGLUMINE 529 MG/ML IV SOLN COMPARISON:  06/03/2015 head CT FINDINGS: There is no evidence of acute infarct, midline  shift, or extra-axial fluid collection. Patchy to confluent T2 hyperintensities are present throughout the subcortical and deep cerebral white matter bilaterally and reflect in part vasogenic edema as well as likely age advanced chronic small vessel ischemia. There is mild cerebral atrophy which is within normal limits for age. Chronic microhemorrhages are scattered throughout both cerebral and cerebellar hemispheres as well as in the thalami and may be due to hypertension. A small amount of blood products are associated with some of the metastases, most notably the largest left frontal lesion. There are numerous enhancing lesions throughout both cerebral hemispheres which are primarily located at the gray-white junction and number greater than 30. The largest lesion on the left is located in the frontal lobe and measures 2.5 x 1.5 cm (series 11, image 36). The largest lesion on the right measures 1.5 x 1.0 cm in the right occipital lobe (series 11, image 23). A single cerebellar lesion measures 5 mm inferiorly on the left (series 11, image 8) without significant edema. As noted on CT, there is also an extra-axial lesion with broad dural interface overlying the right cerebral convexity at the level of the operculum. This demonstrates homogeneous enhancement and measures 3.2 x 1.4 cm (series 11, image 30) without evidence of underlying brain edema. No other enhancing extra-axial lesions are identified. Orbits are unremarkable. There is a small right mastoid effusion. Paranasal sinuses are clear. Major intracranial vascular flow voids are preserved. Calvarial bone marrow signal is diminished diffusely without a suspicious focal lesion identified, compatible with known underlying anemia. IMPRESSION: 1. Numerous bilateral cerebral metastases as above. Mild edema associated with many of these lesions but no significant mass effect. 2. Single, small cerebellar metastasis without edema. 3. 3.2 cm extra-axial lesion at the  level of the operculum on the right. A meningioma is favored over solitary dural metastasis. 4. Age advanced chronic small vessel ischemic disease and chronic microhemorrhages. Electronically Signed   By: Logan Bores M.D.   On: 06-08-2015 14:25    Scheduled Meds: . sodium chloride   Intravenous Once  . ampicillin-sulbactam (UNASYN) IV  3 g Intravenous Q6H  . feeding supplement  1 Container Oral TID BM  . feeding supplement (ENSURE ENLIVE)  237 mL Oral BID BM  . potassium chloride  40 mEq Oral Daily  . sodium chloride flush  3 mL Intravenous Q12H  . vancomycin  750 mg Intravenous Q12H   Continuous Infusions: . sodium chloride 50 mL/hr at 06/07/15 0058  . lactated ringers 50 mL/hr at 06/08/15 1000     Time spent: 25 minutes    Louellen Molder  [facility] Pager [phone]. If 7PM-7AM, please contact night-coverage at [email or web address], password [facility] 06/08/2015, 3:22 PM  LOS: 6 days

## 2015-06-08 NOTE — Progress Notes (Signed)
Call to Dr. Marcell Barlow, reported CBC/w Diff from 06/07/2015.

## 2015-06-08 NOTE — Brief Op Note (Signed)
06/02/2015 - 06/08/2015  1:06 PM  PATIENT:  Robin Cantrell  70 y.o. female  PRE-OPERATIVE DIAGNOSIS:  left breast wound  POST-OPERATIVE DIAGNOSIS:  left breast wound  PROCEDURE:  Procedure(s): IRRIGATION AND DEBRIDEMENT LEFT BREAST WOUND WITH A CELL PLACEMENT AND VAC (Left) WITH A CELL PLACEMENT (Left) AND VAC (Left)  SURGEON:  Surgeon(s) and Role:    * Morenike Cuff S Blakelyn Dinges, DO - Primary  ASSISTANTS: none   ANESTHESIA:   general  EBL:  Total I/O In: 0  Out: 675 [Urine:650; Blood:25]  BLOOD ADMINISTERED:none  DRAINS: (1) Jackson-Pratt drain(s) with closed bulb suction in the left breast pocket   LOCAL MEDICATIONS USED:  NONE  SPECIMEN:  No Specimen  DISPOSITION OF SPECIMEN:  N/A  COUNTS:  YES  TOURNIQUET:  * No tourniquets in log *  DICTATION: .Dragon Dictation  PLAN OF CARE: Admit to inpatient   PATIENT DISPOSITION:  PACU - hemodynamically stable.   Delay start of Pharmacological VTE agent (>24hrs) due to surgical blood loss or risk of bleeding: no

## 2015-06-08 NOTE — Anesthesia Preprocedure Evaluation (Addendum)
Anesthesia Evaluation  Patient identified by MRN, date of birth, ID band Patient awake    Reviewed: Allergy & Precautions, NPO status , Patient's Chart, lab work & pertinent test results  Airway Mallampati: II  TM Distance: >3 FB Neck ROM: Full    Dental no notable dental hx. (+) Dental Advisory Given, Poor Dentition, Missing   Pulmonary former smoker,  Mild centrilobular emphysema. There is no edema, consolidation, effusion, or pneumothorax. No suspicious nodule.   Pulmonary exam normal breath sounds clear to auscultation       Cardiovascular negative cardio ROS Normal cardiovascular exam Rhythm:Regular Rate:Normal     Neuro/Psych negative neurological ROS  negative psych ROS   GI/Hepatic negative GI ROS, Neg liver ROS,   Endo/Other  negative endocrine ROS  Renal/GU Renal InsufficiencyRenal disease  negative genitourinary   Musculoskeletal negative musculoskeletal ROS (+)   Abdominal   Peds negative pediatric ROS (+)  Hematology  (+) anemia , Hgb 5.9 yesterday   Anesthesia Other Findings Upper has two teeth (incisors) only.  Lower middle row of teeth all in poor condition and right premolar is very loose.  Reproductive/Obstetrics negative OB ROS                           Anesthesia Physical  Anesthesia Plan  ASA: III  Anesthesia Plan: General   Post-op Pain Management:    Induction: Intravenous  Airway Management Planned: LMA  Additional Equipment:   Intra-op Plan:   Post-operative Plan: Extubation in OR  Informed Consent: I have reviewed the patients History and Physical, chart, labs and discussed the procedure including the risks, benefits and alternatives for the proposed anesthesia with the patient or authorized representative who has indicated his/her understanding and acceptance.   Dental advisory given  Plan Discussed with: CRNA and Surgeon  Anesthesia Plan  Comments: (Pt refusing blood transfusions for religious reasons.)       Anesthesia Quick Evaluation

## 2015-06-08 NOTE — Progress Notes (Signed)
Pharmacy Antibiotic Note  Robin Cantrell is a 70 y.o. female admitted on 06/02/2015.  She is s/p mastectomy of necrotic infected breast with I&D and placement of wound VAC today. She has a vegetation on her aortic arch and a mobile mass on L atrial appendage.  Unasyn day # 7 and vancomycin day # 7.  WBC 14.2>>8.7>11.7. Afebrile.  creat 0.81.  MD limiting blood drawn due to anemia with Hg down to 5.9.  ID consult recs 14 days of abx with possible switch to PO once ready for discharge.  Vanc off schedule today 2nd OR.  Need to check a SS vanc trough.  Plan: - continue vancomycin to 750 mg IV q12h - continue unasyn 3 gm IV q8h - check SS VT soon if abxs to continue   Height: 5\' 8"  (172.7 cm) Weight: 139 lb 12.4 oz (63.4 kg) IBW/kg (Calculated) : 63.9  Temp (24hrs), Avg:98.1 F (36.7 C), Min:97.6 F (36.4 C), Max:98.4 F (36.9 C)   Recent Labs Lab 06/02/15 0120 06/02/15 0126 06/02/15 0540 06/03/15 0237 06/05/15 0433 06/07/15 0534  WBC 17.9*  --  14.2* 12.2* 8.7 11.7*  CREATININE 1.41*  --  1.45* 1.19* 0.98 0.81  LATICACIDVEN  --  1.43  --   --   --   --     Estimated Creatinine Clearance: 67.5 mL/min (by C-G formula based on Cr of 0.81).    No Known Allergies  Antimicrobials this admission:  2/3 Vanco>> 2/3 Unasyn>> 2/3 Zyvox>>2/3  Dose adjustments this admission: 2/6 change vanc from 1 gm q24 to 750 mg IV q12h  Microbiology results:  2/3 MRSA screen- neg 2/3 blood x2 - diptheroids (1/2) = contaminant 2/4 blood x2 ngtd 2/3 HIV - neg 2/4 Hep C Ab - neg   Thank you for allowing pharmacy to be a part of this patient's care.  Eudelia Bunch, Pharm.D. BP:[phone] 06/08/2015 3:11 PM

## 2015-06-08 NOTE — Progress Notes (Signed)
PT Cancellation Note  Patient Details Name: Robin Cantrell MRN: [id number] DOB: [birth year]   Cancelled Treatment:    Reason Eval/Treat Not Completed: Patient at procedure or test/unavailable.  Pt in OR for repeat I&D, skin graft, and wound vac placement.  PT to check back later today or tomorrow as time allows.  Thanks,    Barbarann Ehlers. [place], [place], DPT [phone]   06/08/2015, 9:22 AM

## 2015-06-08 NOTE — Anesthesia Procedure Notes (Signed)
Procedure Name: LMA Insertion Date/Time: 06/08/2015 12:07 PM Performed by: Lavell Luster Pre-anesthesia Checklist: Patient identified, Emergency Drugs available, Suction available and Patient being monitored Patient Re-evaluated:Patient Re-evaluated prior to inductionOxygen Delivery Method: Circle system utilized Preoxygenation: Pre-oxygenation with 100% oxygen Intubation Type: IV induction Ventilation: Mask ventilation without difficulty LMA: LMA inserted LMA Size: 4.0 Tube type: Oral Placement Confirmation: breath sounds checked- equal and bilateral and positive ETCO2 Tube secured with: Tape Dental Injury: Teeth and Oropharynx as per pre-operative assessment

## 2015-06-08 NOTE — H&P (View-Only) (Signed)
Reason for Consult: Breast wound Referring Physician: Dr. Verita Lamb  Robin Cantrell is an 70 y.o. female.  HPI: The patient is a 70 yrs old bf here for treatment of her left chest mass / wound.  She presented to the ED with a large open wound on her left breast / chest.  She was taken to the OR by Dr. Georgette Dover for resection.  Path is pending.  Cultures positive for infection.  She states that she is homeless.  She has two sons who live in [place] and are coming to see her this week.  She had an ankle fracture several years ago and states she is otherwise healthy.  The lesion started on her left breast as a small lump a few months ago.  She thought it was a boil.  It quickly got unmanageable. Nothing made it better and between the smell, the drainage and the pain she decided to seek care.  The chart was reviewed the pictures reviewed.  This clear consumed the majority of her left breast.  History reviewed. No pertinent past medical history.  Past Surgical History  Procedure Laterality Date  . Ankle fracture surgery      History reviewed. No pertinent family history.  Social History:  reports that she quit smoking about 22 years ago. Her smoking use included Cigarettes. She does not have any smokeless tobacco history on file. She reports that she does not drink alcohol or use illicit drugs.  Allergies: No Known Allergies  Medications: I have reviewed the patient's current medications.  Results for orders placed or performed during the hospital encounter of 06/02/15 (from the past 48 hour(s))  Culture, blood (Routine X 2) w Reflex to ID Panel     Status: None (Preliminary result)   Collection Time: 06/03/15  3:00 PM  Result Value Ref Range   Specimen Description BLOOD RIGHT ANTECUBITAL    Special Requests IN PEDIATRIC BOTTLE 3CC    Culture NO GROWTH 1 DAY    Report Status PENDING   Culture, blood (Routine X 2) w Reflex to ID Panel     Status: None (Preliminary result)   Collection  Time: 06/03/15  3:05 PM  Result Value Ref Range   Specimen Description BLOOD BLOOD RIGHT HAND    Special Requests IN PEDIATRIC BOTTLE 3CC    Culture NO GROWTH 1 DAY    Report Status PENDING   Glucose, capillary     Status: Abnormal   Collection Time: 06/04/15  7:59 AM  Result Value Ref Range   Glucose-Capillary 109 (H) 65 - 99 mg/dL   Comment 1 Capillary Specimen   Basic metabolic panel     Status: Abnormal   Collection Time: 06/05/15  4:33 AM  Result Value Ref Range   Sodium 141 135 - 145 mmol/L   Potassium 3.3 (L) 3.5 - 5.1 mmol/L   Chloride 109 101 - 111 mmol/L   CO2 24 22 - 32 mmol/L   Glucose, Bld 85 65 - 99 mg/dL   BUN 7 6 - 20 mg/dL   Creatinine, Ser 0.98 0.44 - 1.00 mg/dL   Calcium 7.5 (L) 8.9 - 10.3 mg/dL   GFR calc non Af Amer 58 (L) >60 mL/min   GFR calc Af Amer >60 >60 mL/min    Comment: (NOTE) The eGFR has been calculated using the CKD EPI equation. This calculation has not been validated in all clinical situations. eGFR's persistently <60 mL/min signify possible Chronic Kidney Disease.    Anion gap 8  5 - 15  CBC with Differential/Platelet     Status: Abnormal   Collection Time: 06/05/15  4:33 AM  Result Value Ref Range   WBC 8.7 4.0 - 10.5 K/uL   RBC 2.55 (L) 3.87 - 5.11 MIL/uL   Hemoglobin 6.2 (LL) 12.0 - 15.0 g/dL    Comment: REPEATED TO VERIFY CRITICAL VALUE NOTED.  VALUE IS CONSISTENT WITH PREVIOUSLY REPORTED AND CALLED VALUE.    HCT 19.4 (L) 36.0 - 46.0 %   MCV 76.1 (L) 78.0 - 100.0 fL   MCH 24.3 (L) 26.0 - 34.0 pg   MCHC 32.0 30.0 - 36.0 g/dL   RDW 14.4 11.5 - 15.5 %   Platelets 439 (H) 150 - 400 K/uL   Neutrophils Relative % 62 %   Neutro Abs 5.4 1.7 - 7.7 K/uL   Lymphocytes Relative 29 %   Lymphs Abs 2.5 0.7 - 4.0 K/uL   Monocytes Relative 7 %   Monocytes Absolute 0.6 0.1 - 1.0 K/uL   Eosinophils Relative 2 %   Eosinophils Absolute 0.2 0.0 - 0.7 K/uL   Basophils Relative 1 %   Basophils Absolute 0.1 0.0 - 0.1 K/uL  Creatinine, urine,  random     Status: None   Collection Time: 06/05/15  5:00 AM  Result Value Ref Range   Creatinine, Urine 15.85 mg/dL  Sodium, urine, random     Status: None   Collection Time: 06/05/15  5:00 AM  Result Value Ref Range   Sodium, Ur 117 mmol/L  Glucose, capillary     Status: None   Collection Time: 06/05/15  8:43 AM  Result Value Ref Range   Glucose-Capillary 79 65 - 99 mg/dL   Comment 1 Notify RN     Ct Head W Wo Contrast  06/03/2015  CLINICAL DATA:  70 year old female with newly diagnosed breast cancer status post mastectomy yesterday. Subsequent encounter. EXAM: CT HEAD WITHOUT AND WITH CONTRAST TECHNIQUE: Contiguous axial images were obtained from the base of the skull through the vertex without and with intravenous contrast CONTRAST:  35m OMNIPAQUE IOHEXOL 350 MG/ML SOLN COMPARISON:  Chest CT 06/02/2015. FINDINGS: Mild right mastoid effusion. Negative visible nasopharynx. Other Visualized paranasal sinuses and mastoids are clear. No acute or suspicious osseous lesion identified in the visible skull. Visualized scalp soft tissues are within normal limits. Along the right lateral hemisphere there is a 3.3 cm length 1.4 cm with peripheral, dural based appearing hyperdense lesion with enhancement at the level of the operculum (series 4, image 15). Despite this lesions size, there is no definite associated edema. There is patchy and confluent bilateral cerebral white matter hypodensity which might in part be small vessel related, however, following contrast there are multiple subcentimeter round enhancing lesions (most about 7 mm diameter) identified in the right frontal lobe (series 6, images 13 and 14), left frontal lobe, and posterior left temporal lobe. The largest of these lesions is a 14 mm left operculum metastasis on series 6, image 17. Associated vasogenic edema but no significant mass effect. In all 6 such lesions are identified. Despite these findings there is no intracranial mass effect.  Basilar cisterns remain patent. No chronic cortically based infarct identified. No ventriculomegaly. No acute intracranial hemorrhage identified. Major intracranial vascular structures are enhancing. IMPRESSION: 1. Positive for metastatic disease to the brain. At least 6 round enhancing brain masses in both hemispheres, most are small - about 7 mm in size while the largest is 14 mm. Some vasogenic edema but no significant intracranial  mass effect. 2. Superimposed moderate-sized 3 cm right operculum extra-axial appearing hyperdense an enhancing mass. Although dural based metastasis is possible this may instead reflect a superimposed benign meningioma. 3. Follow-up brain MRI without and with contrast may clarify the diagnosis of #2 and detect additional small brain metastases. Electronically Signed   By: Genevie Ann M.D.   On: 06/03/2015 13:41   Ct Abdomen Pelvis W Contrast  06/03/2015  CLINICAL DATA:  Patient status post LEFT mastectomy for metastatic breast cancer. Patient did lesion discovered on CT exam from 06/02/2015. EXAM: CT ANGIOGRAPHY CHEST CT ABDOMEN AND PELVIS WITH CONTRAST TECHNIQUE: Multidetector CT imaging of the chest was performed using the standard protocol during bolus administration of intravenous contrast. Multiplanar CT image reconstructions and MIPs were obtained to evaluate the vascular anatomy. Multidetector CT imaging of the abdomen and pelvis was performed using the standard protocol during bolus administration of intravenous contrast. CONTRAST:  80 mL Omnipaque COMPARISON:  CT 06/02/2015. FINDINGS: CTA CHEST FINDINGS Mediastinum/Nodes: Again demonstrated a irregular mass within the ascending aorta with a stalk like attachment to the aortic wall seen best on image 72, series 12 sagittal series. At its attachment the lesion puckers the wall of the aorta. The lesion is difficult to measure but measure approximately 1.4 cm (image 60, series 8). There is abnormal thickening of the adventitia of  the aorta of throughout the aorta. No additional lesions are present within the aorta. There is uniform thickening of the adventitia throughout aorta. Great vessels are normal. No pulmonary embolism. No pericardial fluid. The LEFT and RIGHT ventricle appear normal. Normal atria Lungs/Pleura: No pulmonary nodularity suggest metastasis. Small bilateral pleural effusions. Musculoskeletal: A postsurgical change in the LEFT chest wall consistent with mastectomy. CT ABDOMEN and PELVIS FINDINGS Hepatobiliary: Small hypodense lesion in the superior aspect of the LEFT hepatic lobe has low attenuation consistent with a cyst. No biliary duct dilatation. Gallbladder normal. Pancreas: Pancreas is normal. No ductal dilatation. No pancreatic inflammation. Spleen: Normal spleen Adrenals/urinary tract: There is bilateral nodular adrenal thickening. The lesion of concern on comparison CT is felt to represent a nodule enlargement of the medial limb of the RIGHT adrenal gland to 13 mm. . No renal obstruction. Ureters are normal. On the lateral projection there is suggestion hypodensity within the dome the bladder which is felt to represent streak artifact from adjacent contrast within the bowel. Stomach/Bowel: Stomach, small bowel, appendix are normal. There is diffuse vessel thickening in the ascending colon without pericolonic inflammation. No mass lesion or obstruction. The LEFT colon and rectosigmoid colon are normal. Vascular/Lymphatic: Abdominal aorta is normal caliber. There is no retroperitoneal or periportal lymphadenopathy. No pelvic lymphadenopathy. Reproductive: Uterus and ovaries are normal. Other: No free fluid. Musculoskeletal: No aggressive osseous lesion. Review of the MIP images confirms the above findings. IMPRESSION: Chest Impression: 1. Pedunculated lesion within the ascending aorta with differential including infectious versus neoplastic etiology. Transesophageal echocardiography may provide further  characterization. 2. Uniform adventitial thickening involving the entirety the thoracic aorta. 3. No evidence of pulmonary metastasis. 4. Postsurgical changes in the LEFT chest wall Abdomen / Pelvis Impression: 1. Small hypodense lesion in the liver likely represents benign cyst. 2. Nodular enlargement of the RIGHT adrenal gland is indeterminate. Consider FDG PET scan for complete staging. 3. No metastatic disease identified elsewhere in the abdomen or pelvis. 4. Mucosal thickening in the ascending colon is nonspecific but suggests segmental colitis. Electronically Signed   By: Suzy Bouchard M.D.   On: 06/03/2015 13:57   Ct  Angio Chest Aorta W/cm &/or Wo/cm  06/03/2015  CLINICAL DATA:  Patient status post LEFT mastectomy for metastatic breast cancer. Patient did lesion discovered on CT exam from 06/02/2015. EXAM: CT ANGIOGRAPHY CHEST CT ABDOMEN AND PELVIS WITH CONTRAST TECHNIQUE: Multidetector CT imaging of the chest was performed using the standard protocol during bolus administration of intravenous contrast. Multiplanar CT image reconstructions and MIPs were obtained to evaluate the vascular anatomy. Multidetector CT imaging of the abdomen and pelvis was performed using the standard protocol during bolus administration of intravenous contrast. CONTRAST:  80 mL Omnipaque COMPARISON:  CT 06/02/2015. FINDINGS: CTA CHEST FINDINGS Mediastinum/Nodes: Again demonstrated a irregular mass within the ascending aorta with a stalk like attachment to the aortic wall seen best on image 72, series 12 sagittal series. At its attachment the lesion puckers the wall of the aorta. The lesion is difficult to measure but measure approximately 1.4 cm (image 60, series 8). There is abnormal thickening of the adventitia of the aorta of throughout the aorta. No additional lesions are present within the aorta. There is uniform thickening of the adventitia throughout aorta. Great vessels are normal. No pulmonary embolism. No pericardial  fluid. The LEFT and RIGHT ventricle appear normal. Normal atria Lungs/Pleura: No pulmonary nodularity suggest metastasis. Small bilateral pleural effusions. Musculoskeletal: A postsurgical change in the LEFT chest wall consistent with mastectomy. CT ABDOMEN and PELVIS FINDINGS Hepatobiliary: Small hypodense lesion in the superior aspect of the LEFT hepatic lobe has low attenuation consistent with a cyst. No biliary duct dilatation. Gallbladder normal. Pancreas: Pancreas is normal. No ductal dilatation. No pancreatic inflammation. Spleen: Normal spleen Adrenals/urinary tract: There is bilateral nodular adrenal thickening. The lesion of concern on comparison CT is felt to represent a nodule enlargement of the medial limb of the RIGHT adrenal gland to 13 mm. . No renal obstruction. Ureters are normal. On the lateral projection there is suggestion hypodensity within the dome the bladder which is felt to represent streak artifact from adjacent contrast within the bowel. Stomach/Bowel: Stomach, small bowel, appendix are normal. There is diffuse vessel thickening in the ascending colon without pericolonic inflammation. No mass lesion or obstruction. The LEFT colon and rectosigmoid colon are normal. Vascular/Lymphatic: Abdominal aorta is normal caliber. There is no retroperitoneal or periportal lymphadenopathy. No pelvic lymphadenopathy. Reproductive: Uterus and ovaries are normal. Other: No free fluid. Musculoskeletal: No aggressive osseous lesion. Review of the MIP images confirms the above findings. IMPRESSION: Chest Impression: 1. Pedunculated lesion within the ascending aorta with differential including infectious versus neoplastic etiology. Transesophageal echocardiography may provide further characterization. 2. Uniform adventitial thickening involving the entirety the thoracic aorta. 3. No evidence of pulmonary metastasis. 4. Postsurgical changes in the LEFT chest wall Abdomen / Pelvis Impression: 1. Small hypodense  lesion in the liver likely represents benign cyst. 2. Nodular enlargement of the RIGHT adrenal gland is indeterminate. Consider FDG PET scan for complete staging. 3. No metastatic disease identified elsewhere in the abdomen or pelvis. 4. Mucosal thickening in the ascending colon is nonspecific but suggests segmental colitis. Electronically Signed   By: Suzy Bouchard M.D.   On: 06/03/2015 13:57    Review of Systems  Constitutional: Negative.   HENT: Negative.   Eyes: Negative.   Respiratory: Negative.   Cardiovascular: Negative.   Gastrointestinal: Negative.   Genitourinary: Negative.   Musculoskeletal: Negative.   Skin: Negative.   Neurological: Negative for seizures.  Psychiatric/Behavioral: Negative.    Blood pressure 170/76, pulse 65, temperature 98.2 F (36.8 C), temperature source Oral, resp.  rate 17, height 5' 8"  (1.727 m), weight 63.4 kg (139 lb 12.4 oz), SpO2 100 %. Physical Exam  Constitutional: She is oriented to person, place, and time. She appears well-developed.  HENT:  Head: Normocephalic and atraumatic.  Eyes: Conjunctivae and EOM are normal. Pupils are equal, round, and reactive to light.  Cardiovascular: Normal rate.   Respiratory: Effort normal.    Neurological: She is alert and oriented to person, place, and time.  Skin: Skin is warm.  Psychiatric: She has a normal mood and affect. Her behavior is normal. Thought content normal.    Assessment/Plan: Will discuss with primary team.  Will need coverage but durability needs will depend on treatment plan for the tumor, ie. Radiation.  Her options are a skin graft versus a latissimus flap.  Agree with VAC for now. Nutrition consult for maximizing nutritional status.  Start multivitamin, Vit C and Zinc.  Wallace Going 06/05/2015, 12:13 PM

## 2015-06-08 NOTE — Progress Notes (Signed)
  Radiation Oncology         [phone]) [phone] ________________________________  Name: Robin Cantrell MRN: [id number]  Date: 06/02/2015   DOB: 1945-08-23  Chart Note:  I reviewed this patient's most recent findings and wanted to take a minute to document my impression.  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  Body CTs show no overt metastatic disease with some adrenal nodularity and small liver hypodensity of uncertain significance.  Patient has multiple brain mets on MRI.  She may benefit from whole brain radiotherapy.  When stable, it may be appropriate to transfer patient to [facility] for brain radiotherapy.  Socioeconomic factors may pose an access barrier to ambulatory care.  The patient disposition will require considerable work to optimize outcomes.  ________________________________  Sheral Apley Tammi Klippel, M.D.

## 2015-06-08 NOTE — Anesthesia Postprocedure Evaluation (Signed)
Anesthesia Post Note  Patient: Robin Cantrell  Procedure(s) Performed: Procedure(s) (LRB): IRRIGATION AND DEBRIDEMENT LEFT BREAST WOUND WITH A CELL PLACEMENT AND VAC (Left) WITH A CELL PLACEMENT (Left) AND VAC (Left)  Patient location during evaluation: PACU Anesthesia Type: General Level of consciousness: awake and alert Pain management: pain level controlled Vital Signs Assessment: post-procedure vital signs reviewed and stable Respiratory status: spontaneous breathing, nonlabored ventilation, respiratory function stable and patient connected to nasal cannula oxygen Cardiovascular status: blood pressure returned to baseline and stable Postop Assessment: no signs of nausea or vomiting Anesthetic complications: no    Last Vitals:  Filed Vitals:   06/08/15 1415 06/08/15 1434  BP: 150/88 145/86  Pulse: 72 71  Temp:    Resp: 11 11    Last Pain:  Filed Vitals:   06/08/15 1436  PainSc: 0-No pain                 Montez Hageman

## 2015-06-08 NOTE — Op Note (Signed)
Operative Note   DATE OF OPERATION: 06/08/2015  LOCATION: [facility]  SURGICAL DIVISION: Plastic Surgery  PREOPERATIVE DIAGNOSES:  Left breast wound after mastectomy 14 x 21 cm  POSTOPERATIVE DIAGNOSES:  same  PROCEDURE:  Tissue advancement of breast 5 x 7 cm with preparation for placement of Acell (3 gm and 10 x 15 cm) placement and VAC  SURGEON: Jenilyn Magana Sanger Moira Umholtz, DO  ANESTHESIA:  General.   COMPLICATIONS: None.   INDICATIONS FOR PROCEDURE:  The patient, Aldine Wedekind is a 70 y.o. female born on 1945-09-29, is here for treatment of left breast wound after resection of breast cancer. MRN: [id number]  CONSENT:  Informed consent was obtained directly from the patient. Risks, benefits and alternatives were fully discussed. Specific risks including but not limited to bleeding, infection, hematoma, seroma, scarring, pain, infection, contracture, asymmetry, wound healing problems, and need for further surgery were all discussed. The patient did have an ample opportunity to have questions answered to satisfaction.   DESCRIPTION OF PROCEDURE:  The patient was taken to the operating room. SCDs were placed and IV antibiotics were given on the unit. The patient's operative site was prepped and draped in a sterile fashion. A time out was performed and all information was confirmed to be correct.  General anesthesia was administered.  The previously placed sutures were removed.  The pocket was irrigated with antibiotic solution.  Hemostasis was achieved with electrocautery.  There was a large vessel from the superior portion of the pectoralis that was bleeding and a clip was used to obtain hemostasis.  The Acell powder was applied to the area (3 gm).  The lateral skin and flap was advanced superior and medial to obtain better coverage for an area of 5 x 7 cm.  This was secured with 3-0 and 4-0 Monocryl.  The lateral and medial most portion was closed as well and the skin  edges re-approximated with 5-0 Monocryl. The Acell sheet 10 x 15 cm was placed in the open space remaining the superior and inferior flaps were advanced 2 cm after undermining the tissue and secured with 4-0 and 3-0 Monocryl.  The Sorbac was secured with 4-0 Silk followed by ky placement and a VAC sponge.  There was an excellent seal.   The patient tolerated the procedure well.  There were no complications. The patient was allowed to wake from anesthesia, extubated and taken to the recovery room in satisfactory condition.

## 2015-06-09 ENCOUNTER — Encounter (HOSPITAL_COMMUNITY): Payer: Self-pay | Admitting: Plastic Surgery

## 2015-06-09 LAB — TYPE AND SCREEN
ABO/RH(D): O POS
Antibody Screen: NEGATIVE
Unit division: 0
Unit division: 0

## 2015-06-09 LAB — CBC
HEMATOCRIT: 27.8 % — AB (ref 36.0–46.0)
HEMOGLOBIN: 9 g/dL — AB (ref 12.0–15.0)
MCH: 25.9 pg — ABNORMAL LOW (ref 26.0–34.0)
MCHC: 32.4 g/dL (ref 30.0–36.0)
MCV: 80.1 fL (ref 78.0–100.0)
Platelets: 444 10*3/uL — ABNORMAL HIGH (ref 150–400)
RBC: 3.47 MIL/uL — AB (ref 3.87–5.11)
RDW: 16.1 % — ABNORMAL HIGH (ref 11.5–15.5)
WBC: 11.4 10*3/uL — AB (ref 4.0–10.5)

## 2015-06-09 LAB — BASIC METABOLIC PANEL
ANION GAP: 9 (ref 5–15)
BUN: <5 mg/dL — ABNORMAL LOW (ref 6–20)
CHLORIDE: 100 mmol/L — AB (ref 101–111)
CO2: 29 mmol/L (ref 22–32)
Calcium: 7.9 mg/dL — ABNORMAL LOW (ref 8.9–10.3)
Creatinine, Ser: 0.87 mg/dL (ref 0.44–1.00)
GFR calc Af Amer: >60 mL/min (ref >60)
GFR calc non Af Amer: >60 mL/min (ref >60)
GLUCOSE: 102 mg/dL — AB (ref 65–99)
POTASSIUM: 3.8 mmol/L (ref 3.5–5.1)
Sodium: 138 mmol/L (ref 135–145)

## 2015-06-09 LAB — GLUCOSE, CAPILLARY: Glucose-Capillary: 87 mg/dL (ref 65–99)

## 2015-06-09 NOTE — Progress Notes (Deleted)
Picked up pt's care after 1500hrs, pt waiting for [facility] to transport to [facility] this pm. Report already given by primary nurse from this morning

## 2015-06-09 NOTE — Progress Notes (Signed)
Pt transferred to [place] this evening. Condition stable at time of discharge

## 2015-06-09 NOTE — Progress Notes (Signed)
Pt waiting for [facility] to be transported to [facility]. Son notified that transport will be a while but pt will be transported today

## 2015-06-09 NOTE — Progress Notes (Signed)
Physical Therapy Treatment Patient Details Name: Robin Cantrell MRN: [id number] DOB: Oct 27, 1945 Today's Date: 06/09/2015    History of Present Illness patient is a 70 yo female who presents with Infection of left breast, possible left breast cancer and vegetation in ascending aorta.  Pt s/p L radical mastectomy and wound vac placement on 06/02/15 and is thought to have mets (liver and adrenal masses), possibly to the brain.  Oncology following.      PT Comments    Pt is pleasant, denies any pain, and is willing to participate in PT. Pt ambulated approx 70 ft with a RW and min assist to help pt maintain balance and to continually redirect the RW to keep it straight. Pt drifts to the R when ambulating, and does not correct this pattern when verbally cued. Pt requires reminders to walk within the base of the walker and for hand placement on the RW's handles. Pt successfully completed general LE exercises.   Follow Up Recommendations  SNF     Equipment Recommendations  Rolling walker with 5" wheels       Precautions / Restrictions Precautions Precautions: Fall Restrictions Weight Bearing Restrictions: No    Mobility  Bed Mobility Overal bed mobility:  (received from bath with nurse tech; returned to recliner)                Transfers Overall transfer level: Needs assistance Equipment used: Rolling walker (2 wheeled) Transfers: Sit to/from Stand Sit to Stand: Min guard         General transfer comment: Pt requires verbal cues for hand placement. Pt's sequencing of motion is off, but she demonstrates adequate strength to successfully complete a STS with min guard for safety.  Ambulation/Gait Ambulation/Gait assistance: Min assist Ambulation Distance (Feet): 70 Feet Assistive device: Rolling walker (2 wheeled) Gait Pattern/deviations: Step-through pattern;Shuffle;Drifts right/left Gait velocity: decreased    General Gait Details: Initially, used 1 hand held assist to  ambulate inside her room. It quickly became apparent that pt required RW and min assist for safety and to maintain her balance. Pt requires multiple verbal cues for hand placement on the RW. Pt really pushes RW to the right. Verbal cues to straighten RW are not sufficient. In addition to supporting her with min assist for stability, pt requires help to redirect the RW to keep it straight. Pt requires verbal cues to walk within the base of the RW.           Balance Overall balance assessment: Needs assistance Sitting-balance support: No upper extremity supported;Feet supported Sitting balance-Leahy Scale: Good     Standing balance support: During functional activity Standing balance-Leahy Scale: Fair Standing balance comment: Pt requires bilateral UE support on RW to effectively ambulate. Pt able to maintain static standing balance without UE support, but in that situation would require min guard for safety.                    Cognition Arousal/Alertness: Awake/alert Behavior During Therapy: WFL for tasks assessed/performed Overall Cognitive Status: No family/caregiver present to determine baseline cognitive functioning Area of Impairment: Memory;Following commands;Safety/judgement;Problem solving;Awareness   Current Attention Level: Sustained   Following Commands: Follows one step commands inconsistently Safety/Judgement: Decreased awareness of safety;Decreased awareness of deficits Awareness: Emergent Problem Solving: Difficulty sequencing;Requires verbal cues;Requires tactile cues;Slow processing General Comments: Pt reported she sometimes "gets confused". When ambulating, pt would nod and seem to understand verbal cues for walking technique with RW, but inconsistently implemented cues.    Exercises  General Exercises - Lower Extremity Long Arc Quad: AROM;Both;10 reps Hip ABduction/ADduction: AROM;Both;10 reps (pillow squeezes ) Hip Flexion/Marching: AROM;Both;10 reps Toe  Raises: AROM;Both;10 reps Heel Raises: AROM;Both;10 reps        Pertinent Vitals/Pain Pain Assessment: No/denies pain Pain Intervention(s): Monitored during session           PT Goals (current goals can now be found in the care plan section) Acute Rehab PT Goals PT Goal Formulation: With patient Time For Goal Achievement: 06/16/15 Potential to Achieve Goals: Good Progress towards PT goals: Progressing toward goals    Frequency  Min 2X/week    PT Plan Current plan remains appropriate       End of Session Equipment Utilized During Treatment: Gait belt Activity Tolerance: Patient tolerated treatment well Patient left: in chair;with call bell/phone within reach;with chair alarm set     Time: [id number] PT Time Calculation (min) (ACUTE ONLY): 22 min  Charges:    1 gait                    [place], [place] office  Arelia Sneddon 06/09/2015, 2:46 PM

## 2015-06-09 NOTE — Progress Notes (Signed)
report given to [facility] and they should be here shortly to transport

## 2015-06-09 NOTE — Progress Notes (Signed)
[facility] PROGRESS NOTE  Robin Cantrell [id number] DOB: Sep 18, 1945 DOA: 06/02/2015 PCP: No primary care provider on file.  brief narrative 70 year old African-American female with no past medical history, currently homeless living in her truck who was admitted on 06/01/2015 with fungating left breast lesion with purulent drainage. CT of the chest showed necrotic breast mass with axillary lymph nodes with liver and adrenal masses suspicious of malignancy. Patient reported having fevers with chills for almost 20 pound weight loss over past few months. [place] [facility] onc[facility] consulted and patient underwent salvage mastectomy of the left breast on 06/02/2015 with wound VAC placed. 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 MRI brain showing numerous bilateral cerebral metastases with mild edema.    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 Grade 3 adenocarcinoma on pathology with lymphovascular invasion. Oncology following and discussing with tumor board. Radiation oncology to be consulted for brain radiation. radiation onc recommends transfer to [facility] to start whole brain radiation.  Started on Decadron. Patient underwent incision and debridement of the left breast wound on 2/9 with A CELL  placement and VAC. Plastic surgery following. Continue Wound  care.   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 Continue vancomycin and Unasyn. 1/2 bottles growing gram-positive cocci on admission (likely a contaminant with diphtheroids). Repeat cultures negative. TEE shows 2 cm mobile pedunculated mass in the ascending aorta. Unclear if this is infectious vegetation versus associated with malignancy. ID recommend to continue current antibiotics with plan on treatment for 14 days and further abx upon discharge will be  decided by ID.Marland Kitchen TEE unremarkable for any vegetations. HIV and hep C antibody negative.  Symptomatic microcytic anemia Progressive drop in hemoglobin (9.3 on admission--> 5.9).  transfused 2 units PRBC on 2/9 and improved..   Iron panel suggest iron deficiency. Received IV iron on 2/4.  Severe protein calorie malnutrition Continue supplements.  Hypokalemia Replenished  DVT prophylaxis: SCDs Diet: Regular   Code Status: Full code Family Communication: none at bedside. D/w son on 2/9. If there are any visitors in the room please take permission from the pt before discussing anything further. Disposition Plan: transfer to [facility] for brain radiation and onc follow up   Consultants:  plastic Surgery ( Dr Migdalia Dk)  Oncology ( Dr Alen Blew)  ID ( Dr Graylon Good)   radaition onc ( Dr Tammi Klippel)  Procedures:  Salvage mastectomy 2/3  CT chest  MRI brain  TEE  Irrigation and debridement of left breast wound with A CELL placement and VAC  Antibiotics:  IV vancomycin and unasyn 2/3  HPI/Subjective: r. Denies any specific symptoms.  Objective: Filed Vitals:   06/09/15 0110 06/09/15 0659  BP: 141/76 126/80  Pulse: 74 71  Temp: 98.1 F (36.7 C) 98 F (36.7 C)  Resp:  16    Intake/Output Summary (Last 24 hours) at 06/09/15 1038 Last data filed at 06/09/15 0900  Gross per 24 hour  Intake   2357 ml  Output   1211 ml  Net   1146 ml   Filed Weights   06/02/15 0445  Weight: 63.4 kg (139 lb 12.4 oz)    Exam:   General:  Elderly thin built female in no acute distress  HEENT: Pallor present, moist mucosa  Chest: Dressing over left breast flap with wound VAC in place  CVS: Normal S1 and S2, no murmurs rubs or gallop  GI: Soft, nondistended, nontender, bowel sounds present  Musculoskeletal: Warm, no edema  CNS: Alert and oriented   Data Reviewed: Basic Metabolic Panel:  Recent Labs Lab 06/03/15 0237 06/05/15 0433 06/07/15 0534 06/09/15 0430  NA 136 141 140  138  K 4.0 3.3* 3.4* 3.8  CL 108 109 104 100*  CO2 18* 24 29 29   GLUCOSE 183* 85 90 102*  BUN 13 7 <5* <5*  CREATININE 1.19* 0.98 0.81 0.87  CALCIUM 7.9* 7.5* 7.8* 7.9*   Liver Function Tests:  Recent Labs Lab 06/03/15 0237 06/07/15 0534  AST 15 22  ALT 15 18  ALKPHOS 63 63  BILITOT 0.5 0.7  PROT 5.1* 5.1*  ALBUMIN 1.6* 1.8*   No results for input(s): LIPASE, AMYLASE in the last 168 hours. No results for input(s): AMMONIA in the last 168 hours. CBC:  Recent Labs Lab 06/03/15 0237 06/03/15 1125 06/05/15 0433 06/07/15 0534 06/09/15 0430  WBC 12.2*  --  8.7 11.7* 11.4*  NEUTROABS  --   --  5.4 8.2*  --   HGB 6.6*  --  6.2* 5.9* 9.0*  HCT 20.7* 19.4* 19.4* 18.3* 27.8*  MCV 76.1*  --  76.1* 75.6* 80.1  PLT 399  --  439* 441* 444*   Cardiac Enzymes: No results for input(s): CKTOTAL, CKMB, CKMBINDEX, TROPONINI in the last 168 hours. BNP (last 3 results) No results for input(s): BNP in the last 8760 hours.  ProBNP (last 3 results) No results for input(s): PROBNP in the last 8760 hours.  CBG:  Recent Labs Lab 06/05/15 0843 06/06/15 0727 06/07/15 0857 06/08/15 0721 06/09/15 0731  GLUCAP 79 87 126* 98 87    Recent Results (from the past 240 hour(s))  Culture, blood (Routine X 2) w Reflex to ID Panel     Status: None   Collection Time: 06/02/15  1:10 AM  Result Value Ref Range Status   Specimen Description BLOOD RIGHT ARM  Final   Special Requests BOTTLES DRAWN AEROBIC AND ANAEROBIC 10ML  Final   Culture  Setup Time   Final    GRAM POSITIVE RODS AEROBIC BOTTLE ONLY CRITICAL RESULT CALLED TO, READ BACK BY AND VERIFIED WITH: Youlanda Roys AT [id number] 06/03/15 BY L BENFIELD    Culture   Final    DIPHTHEROIDS(CORYNEBACTERIUM SPECIES) Standardized susceptibility testing for this organism is not available.    Report Status 06/05/2015 FINAL  Final  Culture, blood (Routine X 2) w Reflex to ID Panel     Status: None   Collection Time: 06/02/15  1:15 AM  Result Value  Ref Range Status   Specimen Description BLOOD RIGHT HAND  Final   Special Requests AEROBIC BOTTLE ONLY 5ML  Final   Culture NO GROWTH 5 DAYS  Final   Report Status 06/07/2015 FINAL  Final  Surgical pcr screen     Status: None   Collection Time: 06/02/15  4:35 AM  Result Value Ref Range Status   MRSA, PCR NEGATIVE NEGATIVE Final   Staphylococcus aureus NEGATIVE NEGATIVE Final    Comment:        The Xpert SA Assay (FDA approved for NASAL specimens in patients over 71 years of age), is one component of a comprehensive surveillance program.  Test performance has been validated by [facility] for patients greater than or equal to 57 year old. It is not intended to diagnose infection nor to guide or monitor treatment.   Culture, blood (Routine X 2) w Reflex to ID Panel  Status: None   Collection Time: 06/03/15  3:00 PM  Result Value Ref Range Status   Specimen Description BLOOD RIGHT ANTECUBITAL  Final   Special Requests IN PEDIATRIC BOTTLE 3CC  Final   Culture NO GROWTH 5 DAYS  Final   Report Status 06/08/2015 FINAL  Final  Culture, blood (Routine X 2) w Reflex to ID Panel     Status: None   Collection Time: 06/03/15  3:05 PM  Result Value Ref Range Status   Specimen Description BLOOD BLOOD RIGHT HAND  Final   Special Requests IN PEDIATRIC BOTTLE 3CC  Final   Culture NO GROWTH 5 DAYS  Final   Report Status 06/08/2015 FINAL  Final     Studies: Mr Jeri Cos [id number] Contrast  06/14/15  CLINICAL DATA:  Newly diagnosed left breast cancer status post mastectomy on 06/02/2015. Staging. Brain metastases on recent head CT. EXAM: MRI HEAD WITHOUT AND WITH CONTRAST TECHNIQUE: Multiplanar, multiecho pulse sequences of the brain and surrounding structures were obtained without and with intravenous contrast. CONTRAST:  20mL MULTIHANCE GADOBENATE DIMEGLUMINE 529 MG/ML IV SOLN COMPARISON:  06/03/2015 head CT FINDINGS: There is no evidence of acute infarct, midline shift, or extra-axial fluid  collection. Patchy to confluent T2 hyperintensities are present throughout the subcortical and deep cerebral white matter bilaterally and reflect in part vasogenic edema as well as likely age advanced chronic small vessel ischemia. There is mild cerebral atrophy which is within normal limits for age. Chronic microhemorrhages are scattered throughout both cerebral and cerebellar hemispheres as well as in the thalami and may be due to hypertension. A small amount of blood products are associated with some of the metastases, most notably the largest left frontal lesion. There are numerous enhancing lesions throughout both cerebral hemispheres which are primarily located at the gray-white junction and number greater than 30. The largest lesion on the left is located in the frontal lobe and measures 2.5 x 1.5 cm (series 11, image 36). The largest lesion on the right measures 1.5 x 1.0 cm in the right occipital lobe (series 11, image 23). A single cerebellar lesion measures 5 mm inferiorly on the left (series 11, image 8) without significant edema. As noted on CT, there is also an extra-axial lesion with broad dural interface overlying the right cerebral convexity at the level of the operculum. This demonstrates homogeneous enhancement and measures 3.2 x 1.4 cm (series 11, image 30) without evidence of underlying brain edema. No other enhancing extra-axial lesions are identified. Orbits are unremarkable. There is a small right mastoid effusion. Paranasal sinuses are clear. Major intracranial vascular flow voids are preserved. Calvarial bone marrow signal is diminished diffusely without a suspicious focal lesion identified, compatible with known underlying anemia. IMPRESSION: 1. Numerous bilateral cerebral metastases as above. Mild edema associated with many of these lesions but no significant mass effect. 2. Single, small cerebellar metastasis without edema. 3. 3.2 cm extra-axial lesion at the level of the operculum on  the right. A meningioma is favored over solitary dural metastasis. 4. Age advanced chronic small vessel ischemic disease and chronic microhemorrhages. Electronically Signed   By: Logan Bores M.D.   On: 06-14-15 14:25    Scheduled Meds: . sodium chloride   Intravenous Once  . ampicillin-sulbactam (UNASYN) IV  3 g Intravenous Q6H  . feeding supplement  1 Container Oral TID BM  . feeding supplement (ENSURE ENLIVE)  237 mL Oral BID BM  . potassium chloride  40 mEq Oral Daily  . sodium chloride  flush  3 mL Intravenous Q12H  . vancomycin  750 mg Intravenous Q12H   Continuous Infusions: . sodium chloride 50 mL/hr at 06/09/15 0115  . lactated ringers 50 mL/hr at 06/08/15 1000     Time spent: 25 minutes    Louellen Molder  [facility] Pager [phone]. If 7PM-7AM, please contact night-coverage at [email or web address], password [facility] 06/09/2015, 10:38 AM  LOS: 7 days

## 2015-06-09 NOTE — Progress Notes (Signed)
ID: newly diagnosed locally neglected high grade breast cancer s/p cytologic reductive mastectomy. Body CTs show no overt metastatic disease with some adrenal nodularity and small liver hypodensity of uncertain significance. Has aortic root pedunculated lesion and pericardial lesion of uncertain etiology (infection vs. Malignancy) Subjective: No new complaints. No fevers.   Interval hx:   Underwent debridement, acell application and wound vac change. Spoke with dr. Marla Roe who felt that wound bed looked good. She is going to get [facility]  Antibiotics:  Anti-infectives    Start     Dose/Rate Route Frequency Ordered Stop   06/08/15 1212  polymyxin B 500,000 Units, bacitracin 50,000 Units in sodium chloride irrigation 0.9 % 500 mL irrigation  Status:  Discontinued       As needed 06/08/15 1213 06/08/15 1320   06/05/15 1400  vancomycin (VANCOCIN) IVPB 750 mg/150 ml premix     750 mg 150 mL/hr over 60 Minutes Intravenous Every 12 hours 06/05/15 1258     06/03/15 0000  vancomycin (VANCOCIN) IVPB 1000 mg/200 mL premix  Status:  Discontinued     1,000 mg 200 mL/hr over 60 Minutes Intravenous Every 24 hours 06/02/15 2350 06/05/15 1258   06/02/15 2000  linezolid (ZYVOX) tablet 600 mg  Status:  Discontinued     600 mg Oral Every 12 hours 06/02/15 0454 06/02/15 2310   06/02/15 0600  linezolid (ZYVOX) IVPB 600 mg     600 mg 300 mL/hr over 60 Minutes Intravenous  Once 06/02/15 0454 06/02/15 0958   06/02/15 0600  Ampicillin-Sulbactam (UNASYN) 3 g in sodium chloride 0.9 % 100 mL IVPB    Comments:  Unasyn 3 g IV q6h for CrCl > 30 mL/min   3 g 100 mL/hr over 60 Minutes Intravenous Every 6 hours 06/02/15 0458     06/02/15 0100  vancomycin (VANCOCIN) 1,500 mg in sodium chloride 0.9 % 500 mL IVPB     1,500 mg 250 mL/hr over 120 Minutes Intravenous  Once 06/02/15 0055 06/02/15 0502   06/02/15 0100  piperacillin-tazobactam (ZOSYN) IVPB 3.375 g     3.375 g 100 mL/hr over 30 Minutes Intravenous   Once 06/02/15 0055 06/02/15 0202      Medications: Scheduled Meds: . sodium chloride   Intravenous Once  . ampicillin-sulbactam (UNASYN) IV  3 g Intravenous Q6H  . feeding supplement  1 Container Oral TID BM  . feeding supplement (ENSURE ENLIVE)  237 mL Oral BID BM  . potassium chloride  40 mEq Oral Daily  . sodium chloride flush  3 mL Intravenous Q12H  . vancomycin  750 mg Intravenous Q12H    Objective: Weight change:   Intake/Output Summary (Last 24 hours) at 06/09/15 1813 Last data filed at 06/09/15 1446  Gross per 24 hour  Intake   1727 ml  Output   1455 ml  Net    272 ml   Blood pressure 111/67, pulse 63, temperature 98.2 F (36.8 C), temperature source Oral, resp. rate 16, height 5\' 8"  (1.727 m), weight 139 lb 12.4 oz (63.4 kg), SpO2 100 %. Temp:  [98 F (36.7 C)-99.5 F (37.5 C)] 98.2 F (36.8 C) (02/10 1329) Pulse Rate:  [63-87] 63 (02/10 1329) Resp:  [16] 16 (02/10 1329) BP: (111-141)/(64-80) 111/67 mmHg (02/10 1329) SpO2:  [97 %-100 %] 100 % (02/10 1329)  Physical Exam: General: Alert and awake, oriented x3, not in any acute distress. HEENT: anicteric sclera,EOMI CVS regular rate, normal r,  no murmur rubs or gallops  Chest: clear to auscultation bilaterally, no wheezing, rales or rhonchi Abdomen: soft nontender, nondistended, normal bowel sounds, Extremities: no  clubbing or edema noted bilaterally Skin: left chest with dressing in place vaccum Neuro: nonfocal  CBC:  CBC Latest Ref Rng 06/09/2015 06/07/2015 06/05/2015  WBC 4.0 - 10.5 K/uL 11.4(H) 11.7(H) 8.7  Hemoglobin 12.0 - 15.0 g/dL 9.0(L) 5.9(LL) 6.2(LL)  Hematocrit 36.0 - 46.0 % 27.8(L) 18.3(L) 19.4(L)  Platelets 150 - 400 K/uL 444(H) 441(H) 439(H)      BMET  Recent Labs  06/07/15 0534 06/09/15 0430  NA 140 138  K 3.4* 3.8  CL 104 100*  CO2 29 29  GLUCOSE 90 102*  BUN <5* <5*  CREATININE 0.81 0.87  CALCIUM 7.8* 7.9*     Liver Panel   Recent Labs  06/07/15 0534  PROT 5.1*    ALBUMIN 1.8*  AST 22  ALT 18  ALKPHOS 63  BILITOT 0.7     Micro Results: Recent Results (from the past 720 hour(s))  Culture, blood (Routine X 2) w Reflex to ID Panel     Status: None   Collection Time: 06/02/15  1:10 AM  Result Value Ref Range Status   Specimen Description BLOOD RIGHT ARM  Final   Special Requests BOTTLES DRAWN AEROBIC AND ANAEROBIC 10ML  Final   Culture  Setup Time   Final    GRAM POSITIVE RODS AEROBIC BOTTLE ONLY CRITICAL RESULT CALLED TO, READ BACK BY AND VERIFIED WITH: Youlanda Roys AT [id number] 06/03/15 BY L BENFIELD    Culture   Final    DIPHTHEROIDS(CORYNEBACTERIUM SPECIES) Standardized susceptibility testing for this organism is not available.    Report Status 06/05/2015 FINAL  Final  Culture, blood (Routine X 2) w Reflex to ID Panel     Status: None   Collection Time: 06/02/15  1:15 AM  Result Value Ref Range Status   Specimen Description BLOOD RIGHT HAND  Final   Special Requests AEROBIC BOTTLE ONLY 5ML  Final   Culture NO GROWTH 5 DAYS  Final   Report Status 06/07/2015 FINAL  Final  Surgical pcr screen     Status: None   Collection Time: 06/02/15  4:35 AM  Result Value Ref Range Status   MRSA, PCR NEGATIVE NEGATIVE Final   Staphylococcus aureus NEGATIVE NEGATIVE Final    Comment:        The Xpert SA Assay (FDA approved for NASAL specimens in patients over 32 years of age), is one component of a comprehensive surveillance program.  Test performance has been validated by [facility] for patients greater than or equal to 56 year old. It is not intended to diagnose infection nor to guide or monitor treatment.   Culture, blood (Routine X 2) w Reflex to ID Panel     Status: None   Collection Time: 06/03/15  3:00 PM  Result Value Ref Range Status   Specimen Description BLOOD RIGHT ANTECUBITAL  Final   Special Requests IN PEDIATRIC BOTTLE 3CC  Final   Culture NO GROWTH 5 DAYS  Final   Report Status 06/08/2015 FINAL  Final  Culture, blood  (Routine X 2) w Reflex to ID Panel     Status: None   Collection Time: 06/03/15  3:05 PM  Result Value Ref Range Status   Specimen Description BLOOD BLOOD RIGHT HAND  Final   Special Requests IN PEDIATRIC BOTTLE 3CC  Final   Culture NO GROWTH 5 DAYS  Final   Report Status 06/08/2015 FINAL  Final  06/03/15: blood culture with GPR 1/2 - diptheroids  Studies/Results: No results found.  06/03/15: CTA  Shows: Pedunculated lesion within the ascending aorta with differential including infectious versus neoplastic etiology.   TEE revealed There was a pedunculated, mobile mass in the ascending aorta. There was also a mobile mass in the pericardial space inferior to the left atrial appendage.  Assessment/Plan:    Principal Problem:   Infection of left breast Active Problems:   Left breast mass   Breast cancer (HCC)   AKI (acute kidney injury) ([place])   Breast infection   Homeless   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HCC)   Aorta disorder (HCC)   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 ([place])   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 (HCC)    Robin Cantrell is a 70 y.o. female with  Necrotic breast likely due to breast cancer with superimposed infection also with vegetation in aortic arch  #1 Necrotic breast tissue with wound  Continue on unasyn for now. Will d/c vancomycin since no hx of mrsa colonization. Plan to treat as mop up with 14 days of treatment but we will see if can switch to orals once ready for discharge. Can switch to amox/clav to finish out course. Currently on day 9 of 14.   #2 Corynebacterium in 1/2 cultures equals contaminant  #3 Vegetation in aortic arch and mobile mass in pericardial space: It is not at all clear that this is an infectious vegetation could easily be an noninfectious vegetation associated with her malignancy. After abtx course, i would repeat surveillance blood cultures to decide if need to treat for endovascular infection. She has no positive blood  cx that are c/w more common bloodstream infection.   Would repeat blood cx on feb 20th  Will see in follow up in 2 wk as o/p   Will sign off. Call if questions   LOS: 7 days   Carlyle Basques 06/09/2015, 6:13 PM

## 2015-06-10 ENCOUNTER — Inpatient Hospital Stay (HOSPITAL_COMMUNITY): Payer: Medicare HMO

## 2015-06-10 ENCOUNTER — Encounter (HOSPITAL_COMMUNITY): Payer: Self-pay | Admitting: Radiology

## 2015-06-10 DIAGNOSIS — R7881 Bacteremia: Secondary | ICD-10-CM

## 2015-06-10 DIAGNOSIS — C50112 Malignant neoplasm of central portion of left female breast: Secondary | ICD-10-CM | POA: Insufficient documentation

## 2015-06-10 DIAGNOSIS — I639 Cerebral infarction, unspecified: Secondary | ICD-10-CM

## 2015-06-10 HISTORY — DX: Cerebral infarction, unspecified: I63.9

## 2015-06-10 MED ORDER — SIMVASTATIN 10 MG PO TABS
10.0000 mg | ORAL_TABLET | Freq: Every day | ORAL | Status: DC
  Administered 2015-06-10 – 2015-06-13 (×4): 10 mg via ORAL
  Filled 2015-06-10 (×4): qty 1

## 2015-06-10 MED ORDER — DEXAMETHASONE SODIUM PHOSPHATE 4 MG/ML IJ SOLN
4.0000 mg | Freq: Two times a day (BID) | INTRAMUSCULAR | Status: DC
  Administered 2015-06-10 – 2015-06-14 (×8): 4 mg via INTRAVENOUS
  Filled 2015-06-10 (×8): qty 1

## 2015-06-10 NOTE — Progress Notes (Addendum)
[facility] PROGRESS NOTE  Robin Cantrell [id number] DOB: 07-Aug-1945 DOA: 06/02/2015 PCP: No primary care provider on file.  brief narrative 70 year old African-American female with no past medical history, currently homeless living in her truck who was admitted on 06/01/2015 with fungating left breast lesion with purulent drainage. CT of the chest showed necrotic breast mass with axillary lymph nodes with liver and adrenal masses suspicious of malignancy. Patient reported having fevers with chills for almost 20 pound weight loss over past few months. [place] [facility] consulted and patient underwent salvage mastectomy of the left breast on 06/02/2015 with wound VAC placed. 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 MRI brain showing numerous bilateral cerebral metastases with mild edema.  HPI/Subjective: Complains of blurred vision today.No complaints- Specifically, no complaints of pain, nausea, vomiting, diarrhea, cough or dyspnea.   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  -Oncology following and plans to discuss in tumor board  - Radiation oncology to be consulted for brain radiation. radiation onc recommends transfer to [facility] to start whole brain radiation.   - Patient underwent incision and debridement of the left breast wound on 2/9 with A CELL  placement and VAC. - Plastic surgery following. Continue Wound  care.  Vision loss - loss of vision in left field of vision- left hemianopsia - the patient noted this today - likely due to brain metastasis- will obtain head CT to see if she has had a hemorrhage or ischemic infarct Addendum: small acute ischemic infarct in right occipital lobe corresponding with vision loss- will add statin- hold off on aspirin to prevent hemorrhage  into brain mets.- discussed finding with patient and son.   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 Continue Vancomycin and Unasyn. 1/2 bottles growing gram-positive cocci on admission (likely a contaminant with diphtheroids). Repeat cultures negative. TEE shows 2 cm mobile pedunculated mass in the ascending aorta. Unclear if this is infectious vegetation versus associated with malignancy. ID recommend to continue current antibiotics with plan on treatment for 14 days and further abx upon discharge will be decided by ID. TEE unremarkable for any vegetations. HIV and hep C antibody negative - stop date of antibiotics 2/16- per ID, can switch to orals when ready for discharge  Symptomatic microcytic anemia Progressive drop in hemoglobin (9.3 on admission--> 5.9).  transfused 2 units PRBC on 2/9 and improved..   Iron panel suggest iron deficiency. Received IV iron on 2/4.  Severe protein calorie malnutrition Continue supplements.  Hypokalemia Replenished  DVT prophylaxis: SCDs Diet: Regular   Code Status: Full code Family Communication: none at bedside.  Disposition Plan: transfer to [facility] for brain radiation and onc follow up   Consultants:  plastic Surgery ( Dr Migdalia Dk)  Oncology ( Dr Alen Blew)  ID ( Dr Graylon Good)   radaition onc ( Dr Tammi Klippel)  Procedures:  Salvage mastectomy 2/3  CT chest  MRI brain  TEE  Irrigation and debridement of left breast wound with A CELL placement and VAC  Antibiotics:  IV vancomycin and unasyn 2/3   Objective: Filed Vitals:   06/09/15 2124 06/10/15 0652  BP: 150/83 155/65  Pulse: 60 65  Temp: 98.1 F (36.7 C) 98.2 F (36.8 C)  Resp: 18 18    Intake/Output Summary (Last 24 hours) at 06/10/15 1338 Last data filed  at 06/10/15 1106  Gross per 24 hour  Intake 4655.83 ml  Output   1850 ml  Net 2805.83 ml   Filed Weights   06/02/15 0445 06/09/15 1834  Weight: 63.4 kg  (139 lb 12.4 oz) 64.955 kg (143 lb 3.2 oz)    Exam:   General:  Elderly female in no acute distress  HEENT: no scleral icterus, moist mucosa  Chest: Dressing over left breast flap with wound VAC in place  CVS: Normal S1 and S2, no murmurs rubs or gallop  GI: Soft, nondistended, nontender, bowel sounds present  Musculoskeletal: Warm, no edema    Data Reviewed: Basic Metabolic Panel:  Recent Labs Lab 06/05/15 0433 06/07/15 0534 06/09/15 0430  NA 141 140 138  K 3.3* 3.4* 3.8  CL 109 104 100*  CO2 24 29 29   GLUCOSE 85 90 102*  BUN 7 <5* <5*  CREATININE 0.98 0.81 0.87  CALCIUM 7.5* 7.8* 7.9*   Liver Function Tests:  Recent Labs Lab 06/07/15 0534  AST 22  ALT 18  ALKPHOS 63  BILITOT 0.7  PROT 5.1*  ALBUMIN 1.8*   No results for input(s): LIPASE, AMYLASE in the last 168 hours. No results for input(s): AMMONIA in the last 168 hours. CBC:  Recent Labs Lab 06/05/15 0433 06/07/15 0534 06/09/15 0430  WBC 8.7 11.7* 11.4*  NEUTROABS 5.4 8.2*  --   HGB 6.2* 5.9* 9.0*  HCT 19.4* 18.3* 27.8*  MCV 76.1* 75.6* 80.1  PLT 439* 441* 444*   Cardiac Enzymes: No results for input(s): CKTOTAL, CKMB, CKMBINDEX, TROPONINI in the last 168 hours. BNP (last 3 results) No results for input(s): BNP in the last 8760 hours.  ProBNP (last 3 results) No results for input(s): PROBNP in the last 8760 hours.  CBG:  Recent Labs Lab 06/05/15 0843 06/06/15 0727 06/07/15 0857 06/08/15 0721 06/09/15 0731  GLUCAP 79 87 126* 98 87    Recent Results (from the past 240 hour(s))  Culture, blood (Routine X 2) w Reflex to ID Panel     Status: None   Collection Time: 06/02/15  1:10 AM  Result Value Ref Range Status   Specimen Description BLOOD RIGHT ARM  Final   Special Requests BOTTLES DRAWN AEROBIC AND ANAEROBIC 10ML  Final   Culture  Setup Time   Final    GRAM POSITIVE RODS AEROBIC BOTTLE ONLY CRITICAL RESULT CALLED TO, READ BACK BY AND VERIFIED WITH: Youlanda Roys AT [id number]  06/03/15 BY L BENFIELD    Culture   Final    DIPHTHEROIDS(CORYNEBACTERIUM SPECIES) Standardized susceptibility testing for this organism is not available.    Report Status 06/05/2015 FINAL  Final  Culture, blood (Routine X 2) w Reflex to ID Panel     Status: None   Collection Time: 06/02/15  1:15 AM  Result Value Ref Range Status   Specimen Description BLOOD RIGHT HAND  Final   Special Requests AEROBIC BOTTLE ONLY 5ML  Final   Culture NO GROWTH 5 DAYS  Final   Report Status 06/07/2015 FINAL  Final  Surgical pcr screen     Status: None   Collection Time: 06/02/15  4:35 AM  Result Value Ref Range Status   MRSA, PCR NEGATIVE NEGATIVE Final   Staphylococcus aureus NEGATIVE NEGATIVE Final    Comment:        The Xpert SA Assay (FDA approved for NASAL specimens in patients over 13 years of age), is one component of a comprehensive surveillance program.  Test performance has been  validated by [facility] for patients greater than or equal to 76 year old. It is not intended to diagnose infection nor to guide or monitor treatment.   Culture, blood (Routine X 2) w Reflex to ID Panel     Status: None   Collection Time: 06/03/15  3:00 PM  Result Value Ref Range Status   Specimen Description BLOOD RIGHT ANTECUBITAL  Final   Special Requests IN PEDIATRIC BOTTLE 3CC  Final   Culture NO GROWTH 5 DAYS  Final   Report Status 06/08/2015 FINAL  Final  Culture, blood (Routine X 2) w Reflex to ID Panel     Status: None   Collection Time: 06/03/15  3:05 PM  Result Value Ref Range Status   Specimen Description BLOOD BLOOD RIGHT HAND  Final   Special Requests IN PEDIATRIC BOTTLE 3CC  Final   Culture NO GROWTH 5 DAYS  Final   Report Status 06/08/2015 FINAL  Final     Studies: No results found.  Scheduled Meds: . sodium chloride   Intravenous Once  . ampicillin-sulbactam (UNASYN) IV  3 g Intravenous Q6H  . feeding supplement  1 Container Oral TID BM  . feeding supplement (ENSURE ENLIVE)  237  mL Oral BID BM  . potassium chloride  40 mEq Oral Daily  . sodium chloride flush  3 mL Intravenous Q12H  . vancomycin  750 mg Intravenous Q12H   Continuous Infusions: . sodium chloride 50 mL/hr at 06/10/15 0607  . lactated ringers 50 mL/hr at 06/08/15 1000     Time spent: 25 minutes    [place] [facility] Pager: [email or web address] If 7PM-7AM, please contact night-coverage at [email or web address], password [facility] 06/10/2015, 1:38 PM  LOS: 8 days

## 2015-06-10 NOTE — Progress Notes (Signed)
Spoke with patient's son yesterday.  He would like social work to help him and his mother fill out paperwork for advanced directives.  He is concerned that he may be in [place] when SW rounds. He is very anxious to get this paperwork completed.  Per son he would like to be appointed HCPOA.  Please call son [name] at [phone]. Thanks, Azzie Glatter [place]

## 2015-06-11 DIAGNOSIS — C50112 Malignant neoplasm of central portion of left female breast: Secondary | ICD-10-CM

## 2015-06-11 LAB — VANCOMYCIN, TROUGH: VANCOMYCIN TR: 19 ug/mL (ref 10.0–20.0)

## 2015-06-11 MED ORDER — DIPHENHYDRAMINE HCL 25 MG PO CAPS
25.0000 mg | ORAL_CAPSULE | Freq: Every day | ORAL | Status: DC
  Administered 2015-06-11 – 2015-06-13 (×3): 25 mg via ORAL
  Filled 2015-06-11 (×3): qty 1

## 2015-06-11 MED ORDER — DOCUSATE SODIUM 100 MG PO CAPS
200.0000 mg | ORAL_CAPSULE | Freq: Every day | ORAL | Status: DC
  Administered 2015-06-11 – 2015-06-13 (×3): 200 mg via ORAL
  Filled 2015-06-11 (×4): qty 2

## 2015-06-11 MED ORDER — BISACODYL 5 MG PO TBEC
10.0000 mg | DELAYED_RELEASE_TABLET | Freq: Once | ORAL | Status: AC
  Administered 2015-06-11: 10 mg via ORAL
  Filled 2015-06-11: qty 2

## 2015-06-11 NOTE — Progress Notes (Signed)
[facility] PROGRESS NOTE  Robin Cantrell [id number] DOB: December 14, 1945 DOA: 06/02/2015 PCP: No primary care provider on file.  brief narrative 70 year old African-American female with no past medical history, currently homeless living in her truck who was admitted on 06/01/2015 with fungating left breast lesion with purulent drainage. CT of the chest showed necrotic breast mass with axillary lymph nodes with liver and adrenal masses suspicious of malignancy. Patient reported having fevers with chills for almost 20 pound weight loss over past few months. [place] [facility] onc[facility] consulted and patient underwent salvage mastectomy of the left breast on 06/02/2015 with wound VAC placed. 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 MRI brain showing numerous bilateral cerebral metastases with mild edema.  HPI/Subjective: Continues to have left visual field defect. Constipated. No other complaints. Specifically, no cough, dyspnea, nausea, vomiting, abdominal pain.   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  -Oncology following and plans to discuss in tumor board  - Radiation oncology to be consulted for brain radiation. radiation onc recommends transfer to [facility] to start whole brain radiation.   - Patient underwent incision and debridement of the left breast wound on 2/9 with A CELL  placement and VAC. - Plastic surgery following. Continue Wound  care.  Vision loss - loss of vision in left field of vision- left hemianopsia -small 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 Continue Vancomycin and Unasyn.  - 1/2 bottles growing gram-positive cocci on admission (likely a contaminant with diphtheroids). Repeat cultures negative. - TEE shows 2 cm mobile pedunculated mass in the ascending aorta. Unclear if this is infectious vegetation versus associated with malignancy. - ID recommend to continue current antibiotics with plan on treatment for 14 days and further abx upon discharge will be decided by ID.  - HIV and hep C antibody negative - stop date of antibiotics 2/16- per ID, can switch to orals when ready for discharge  Symptomatic microcytic anemia Progressive drop in hemoglobin (9.3 on admission--> 5.9).   - Transfused 2 units PRBC on 2/9 and improved..  - Iron panel suggest iron deficiency. Received IV iron on 2/4.  Severe protein calorie malnutrition Continue supplements.  Hypokalemia Replenished  DVT prophylaxis: SCDs Diet: Regular   Code Status: Full code Family Communication: none at bedside.  Disposition Plan: transfer to [facility] for brain radiation and onc follow up   Consultants:  plastic Surgery ( Dr Migdalia Dk)  Oncology ( Dr Alen Blew)  ID ( Dr Graylon Good)   radaition onc ( Dr Tammi Klippel)  Procedures:  Salvage mastectomy 2/3  CT chest  MRI brain  TEE  Irrigation and debridement of left breast wound with A CELL placement and VAC  Antibiotics:  IV vancomycin and unasyn 2/3   Objective: Filed Vitals:   06/10/15 2043 06/11/15 0631  BP: 155/85 134/72  Pulse: 73 57  Temp: 98.7 F (37.1 C) 98.5 F (36.9 C)  Resp: 18 16    Intake/Output Summary (Last 24 hours) at 06/11/15 1433 Last data filed at 06/11/15 0900  Gross per 24 hour  Intake   1129 ml  Output   2710 ml  Net  -1581 ml  Filed Weights   06/02/15 0445 06/09/15 1834  Weight: 63.4 kg (139 lb 12.4 oz) 64.955 kg (143 lb 3.2 oz)    Exam:   General:  Elderly female in no acute distress  HEENT: no scleral icterus, moist mucosa  Chest: Dressing over left  breast flap with wound VAC in place  CVS: Normal S1 and S2, no murmurs rubs or gallop  GI: Soft, nondistended, nontender, bowel sounds present  Musculoskeletal: Warm, no edema    Data Reviewed: Basic Metabolic Panel:  Recent Labs Lab 06/05/15 0433 06/07/15 0534 06/09/15 0430  NA 141 140 138  K 3.3* 3.4* 3.8  CL 109 104 100*  CO2 24 29 29   GLUCOSE 85 90 102*  BUN 7 <5* <5*  CREATININE 0.98 0.81 0.87  CALCIUM 7.5* 7.8* 7.9*   Liver Function Tests:  Recent Labs Lab 06/07/15 0534  AST 22  ALT 18  ALKPHOS 63  BILITOT 0.7  PROT 5.1*  ALBUMIN 1.8*   No results for input(s): LIPASE, AMYLASE in the last 168 hours. No results for input(s): AMMONIA in the last 168 hours. CBC:  Recent Labs Lab 06/05/15 0433 06/07/15 0534 06/09/15 0430  WBC 8.7 11.7* 11.4*  NEUTROABS 5.4 8.2*  --   HGB 6.2* 5.9* 9.0*  HCT 19.4* 18.3* 27.8*  MCV 76.1* 75.6* 80.1  PLT 439* 441* 444*   Cardiac Enzymes: No results for input(s): CKTOTAL, CKMB, CKMBINDEX, TROPONINI in the last 168 hours. BNP (last 3 results) No results for input(s): BNP in the last 8760 hours.  ProBNP (last 3 results) No results for input(s): PROBNP in the last 8760 hours.  CBG:  Recent Labs Lab 06/05/15 0843 06/06/15 0727 06/07/15 0857 06/08/15 0721 06/09/15 0731  GLUCAP 79 87 126* 98 87    Recent Results (from the past 240 hour(s))  Culture, blood (Routine X 2) w Reflex to ID Panel     Status: None   Collection Time: 06/02/15  1:10 AM  Result Value Ref Range Status   Specimen Description BLOOD RIGHT ARM  Final   Special Requests BOTTLES DRAWN AEROBIC AND ANAEROBIC 10ML  Final   Culture  Setup Time   Final    GRAM POSITIVE RODS AEROBIC BOTTLE ONLY CRITICAL RESULT CALLED TO, READ BACK BY AND VERIFIED WITH: Youlanda Roys AT [id number] 06/03/15 BY L BENFIELD    Culture   Final    DIPHTHEROIDS(CORYNEBACTERIUM SPECIES) Standardized susceptibility testing for this organism is not available.    Report Status  06/05/2015 FINAL  Final  Culture, blood (Routine X 2) w Reflex to ID Panel     Status: None   Collection Time: 06/02/15  1:15 AM  Result Value Ref Range Status   Specimen Description BLOOD RIGHT HAND  Final   Special Requests AEROBIC BOTTLE ONLY 5ML  Final   Culture NO GROWTH 5 DAYS  Final   Report Status 06/07/2015 FINAL  Final  Surgical pcr screen     Status: None   Collection Time: 06/02/15  4:35 AM  Result Value Ref Range Status   MRSA, PCR NEGATIVE NEGATIVE Final   Staphylococcus aureus NEGATIVE NEGATIVE Final    Comment:        The Xpert SA Assay (FDA approved for NASAL specimens in patients over 63 years of age), is one component of a comprehensive surveillance program.  Test performance has been validated by [facility] for patients greater than or equal to 72 year old. It is not intended to diagnose infection nor to guide  or monitor treatment.   Culture, blood (Routine X 2) w Reflex to ID Panel     Status: None   Collection Time: 06/03/15  3:00 PM  Result Value Ref Range Status   Specimen Description BLOOD RIGHT ANTECUBITAL  Final   Special Requests IN PEDIATRIC BOTTLE 3CC  Final   Culture NO GROWTH 5 DAYS  Final   Report Status 06/08/2015 FINAL  Final  Culture, blood (Routine X 2) w Reflex to ID Panel     Status: None   Collection Time: 06/03/15  3:05 PM  Result Value Ref Range Status   Specimen Description BLOOD BLOOD RIGHT HAND  Final   Special Requests IN PEDIATRIC BOTTLE 3CC  Final   Culture NO GROWTH 5 DAYS  Final   Report Status 06/08/2015 FINAL  Final     Studies: Ct Head Wo Contrast  06/10/2015  CLINICAL DATA:  Sudden onset of left visual field loss today. Frontal headache and confusion. Personal history of meningioma. Breast cancer with multiple metastatic lesions of the brain. EXAM: CT HEAD WITHOUT CONTRAST TECHNIQUE: Contiguous axial images were obtained from the base of the skull through the vertex without intravenous contrast. COMPARISON:  MRI brain  06/07/2015 FINDINGS: A calcified meningioma adjacent to the right frontal lobe is stable, measuring 2.9 x 1.4 cm. Extensive subcortical white matter disease represents a combination of chronic microvascular ischemia and vasogenic edema associated with multiple known brain metastases. A new area of confluent cortical hypoattenuation is present in the right occipital lobe compatible with an acute nonhemorrhagic infarct. This is new since the study 3 days ago. The paranasal sinuses and mastoid air cells are clear. The calvarium is intact. No significant extracranial lesions are present. IMPRESSION: 1. Acute nonhemorrhagic infarct of the right occipital lobe corresponding with the patient's left hemi nap CF. 2. Extensive white matter hypoattenuation representing a combination of chronic microvascular ischemia and vasogenic edema associated with the patient's extensive metastatic disease to the brain. 3. Right-sided meningioma. Electronically Signed   By: San Morelle M.D.   On: 06/10/2015 16:40    Scheduled Meds: . sodium chloride   Intravenous Once  . ampicillin-sulbactam (UNASYN) IV  3 g Intravenous Q6H  . dexamethasone  4 mg Intravenous Q12H  . diphenhydrAMINE  25 mg Oral QHS  . docusate sodium  200 mg Oral Daily  . feeding supplement  1 Container Oral TID BM  . feeding supplement (ENSURE ENLIVE)  237 mL Oral BID BM  . simvastatin  10 mg Oral q1800  . sodium chloride flush  3 mL Intravenous Q12H  . vancomycin  750 mg Intravenous Q12H   Continuous Infusions:     Time spent: 25 minutes    [place]  [facility] Pager: [email or web address] If 7PM-7AM, please contact night-coverage at [email or web address], password [facility] 06/11/2015, 2:33 PM  LOS: 9 days

## 2015-06-11 NOTE — Clinical Social Work Placement (Signed)
   CLINICAL SOCIAL WORK PLACEMENT  NOTE  Date:  06/11/2015  Patient Details  Name: Khou Tureaud MRN: [id number] Date of Birth: [birth year]  Clinical Social Work is seeking post-discharge placement for this patient at the [place] level of care (*CSW will initial, date and re-position this form in  chart as items are completed):  Yes   Patient/family provided with [place] Work Department's list of facilities offering this level of care within the geographic area requested by the patient (or if unable, by the patient's family).  Yes   Patient/family informed of their freedom to choose among providers that offer the needed level of care, that participate in Medicare, Medicaid or managed care program needed by the patient, have an available bed and are willing to accept the patient.  Yes   Patient/family informed of [facility]'s ownership interest in [facility] and [facility], as well as of the fact that they are under no obligation to receive care at these facilities.  PASRR submitted to EDS on       PASRR number received on       Existing PASRR number confirmed on       FL2 transmitted to all facilities in geographic area requested by pt/family on 06/05/15     FL2 transmitted to all facilities within larger geographic area on       Patient informed that his/her managed care company has contracts with or will negotiate with certain facilities, including the following:            Patient/family informed of bed offers received.  Patient chooses bed at       Physician recommends and patient chooses bed at      Patient to be transferred to   on  .  Patient to be transferred to facility by       Patient family notified on   of transfer.  Name of family member notified:        PHYSICIAN Please prepare priority discharge summary, including medications, Please prepare prescriptions, Please sign FL2     Additional Comment:     _______________________________________________ Rigoberto Noel, LCSW 06/11/2015, 3:27 PM

## 2015-06-11 NOTE — Progress Notes (Signed)
Pharmacy Antibiotic Note  Robin Cantrell is a 70 y.o. female admitted on 06/02/2015 with infected left breast mass with ascending aorta vegetation.  Pharmacy has been consulted for Unasyn and Vancomycin dosing.  Plan: Continue Unasyn 3g IV q6h. Continue Vancomycin 750 mg q12h. Vanc trough therapeutic today; however, would consider discontinuing based on ID recommendations since patient has not history of MRSA colonization.  Height: 5\' 8"  (172.7 cm) Weight: 143 lb 3.2 oz (64.955 kg) IBW/kg (Calculated) : 63.9  Temp (24hrs), Avg:98.5 F (36.9 C), Min:98.2 F (36.8 C), Max:98.7 F (37.1 C)   Recent Labs Lab 06/05/15 0433 06/07/15 0534 06/09/15 0430 06/11/15 1440  WBC 8.7 11.7* 11.4*  --   CREATININE 0.98 0.81 0.87  --   VANCOTROUGH  --   --   --  19    Estimated Creatinine Clearance: 63.3 mL/min (by C-G formula based on Cr of 0.87).    No Known Allergies  Antimicrobials this admission: 2/3 Vanc >>  2/3 Unasyn >> 2/3 Zyvox>>2/3  Dose adjustments this admission: 2/12 VT 19 on 750 mg q12h  Microbiology results: 2/3 MRSA screen- neg 2/3 blood x2 - diptheroids (1/2) = contaminant 2/4 blood x2 - negative 2/3 HIV - neg 2/4 Hep C Ab - neg  Thank you for allowing pharmacy to be a part of this patient's care.  Hershal Coria 06/11/2015 4:00 PM

## 2015-06-12 ENCOUNTER — Ambulatory Visit
Admit: 2015-06-12 | Discharge: 2015-06-12 | Disposition: A | Discharge status: Home / Self Care | Payer: Medicare HMO | Attending: Radiation Oncology | Admitting: Radiation Oncology

## 2015-06-12 ENCOUNTER — Other Ambulatory Visit: Payer: Medicare HMO

## 2015-06-12 ENCOUNTER — Ambulatory Visit: Payer: Medicare HMO | Admitting: Radiation Oncology

## 2015-06-12 ENCOUNTER — Ambulatory Visit: Payer: Medicare HMO

## 2015-06-12 DIAGNOSIS — C7931 Secondary malignant neoplasm of brain: Secondary | ICD-10-CM | POA: Insufficient documentation

## 2015-06-12 DIAGNOSIS — C801 Malignant (primary) neoplasm, unspecified: Secondary | ICD-10-CM | POA: Insufficient documentation

## 2015-06-12 DIAGNOSIS — Z510 Encounter for antineoplastic radiation therapy: Secondary | ICD-10-CM | POA: Insufficient documentation

## 2015-06-12 LAB — CBC
HEMATOCRIT: 27 % — AB (ref 36.0–46.0)
HEMOGLOBIN: 8.5 g/dL — AB (ref 12.0–15.0)
MCH: 26 pg (ref 26.0–34.0)
MCHC: 31.5 g/dL (ref 30.0–36.0)
MCV: 82.6 fL (ref 78.0–100.0)
Platelets: 536 10*3/uL — ABNORMAL HIGH (ref 150–400)
RBC: 3.27 MIL/uL — AB (ref 3.87–5.11)
RDW: 16.6 % — ABNORMAL HIGH (ref 11.5–15.5)
WBC: 18.2 10*3/uL — ABNORMAL HIGH (ref 4.0–10.5)

## 2015-06-12 LAB — BASIC METABOLIC PANEL
Anion gap: 9 (ref 5–15)
BUN: 15 mg/dL (ref 6–20)
CHLORIDE: 102 mmol/L (ref 101–111)
CO2: 25 mmol/L (ref 22–32)
Calcium: 8.5 mg/dL — ABNORMAL LOW (ref 8.9–10.3)
Creatinine, Ser: 0.84 mg/dL (ref 0.44–1.00)
GFR calc non Af Amer: >60 mL/min (ref >60)
Glucose, Bld: 167 mg/dL — ABNORMAL HIGH (ref 65–99)
POTASSIUM: 4.6 mmol/L (ref 3.5–5.1)
SODIUM: 136 mmol/L (ref 135–145)

## 2015-06-12 LAB — GLUCOSE, CAPILLARY: GLUCOSE-CAPILLARY: 149 mg/dL — AB (ref 65–99)

## 2015-06-12 NOTE — Progress Notes (Signed)
Radiation Oncology         [phone] ________________________________  Name: Robin Cantrell MRN: [id number]  Date: 06/02/2015  DOB: May 10, 1945   INPATIENT   SIMULATION AND TREATMENT PLANNING NOTE    ICD-9-CM ICD-10-CM   1. 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 ([place]) 174.8 C50.812   2. 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HCC) 444.1 I74.10   3. 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 Aorta disorder (HCC) 447.9 I77.9 CT ANGIO CHEST AORTA W/CM &/OR WO/CM     CT ANGIO CHEST AORTA W/CM &/OR WO/CM  5. Meningeal metastases (HCC) 198.4 C79.49 MR Brain W Wo Contrast     MR Brain W Wo Contrast     CANCELED: MR Brain W Contrast     CANCELED: MR Brain W Contrast  6. Malignant neoplasm of central portion of left female breast ([place]) 174.1 C50.112 Informed Consent Details: Transcribe to consent form and obtain patient signature     Labs per anesthesia     Informed Consent Details: Transcribe to consent form and obtain patient signature     Labs per anesthesia     CANCELED: Diet NPO time specified     CANCELED: SCD's     CANCELED: SCD's  7. Breast wound, left, initial encounter 879.0 S21.002A Informed Consent Details: Transcribe to consent form and obtain patient signature     Labs per anesthesia     Informed Consent Details: Transcribe to consent form and obtain patient signature     Labs per anesthesia     CANCELED: Diet NPO time specified     CANCELED: SCD's     CANCELED: SCD's  8. Vision loss 369.9 H54.7 CT Head Wo Contrast     CT Head Wo Contrast    DIAGNOSIS: 70 yo woman with multiple brain mets from locally advanced breast cancer  NARRATIVE:  The patient was brought to the [facility].  Identity was confirmed.  All relevant records and images related to the planned course of therapy were reviewed.  The patient freely provided informed written consent to proceed with treatment after  reviewing the details related to the planned course of therapy. The consent form was witnessed and verified by the simulation staff.  Then, the patient was set-up in a stable reproducible  supine position for radiation therapy.  CT images were obtained.  Surface markings were placed.  The CT images were loaded into the planning software.  Then the target and avoidance structures were contoured.  Treatment planning then occurred.  The radiation prescription was entered and confirmed.  Then, I designed and supervised the construction of a total of 3 medically necessary complex treatment devices, including a custom made thermoplastic mask used for immobilization and two complex multileaf collimators to cover the entire intracranial contents, while shielding the eyes and face.  Each [facility] is independently created to account for beam divergence.  The right and left lateral fields will be treated with 6 MV X-rays.  I have requested : Isodose Plan.    PLAN:  The whole brain will be treated to 30 Gy in 10 fractions.  ________________________________  Sheral Apley Tammi Klippel, M.D.  This document serves as a record of services personally performed by Tyler Pita, MD. It was created on his behalf by Darcus Austin, a trained medical scribe. The creation of this record is based on the scribe's personal observations and the provider's statements to  them. This document has been checked and approved by the attending provider.

## 2015-06-12 NOTE — Progress Notes (Signed)
CSW continuing to follow.   Pt transferred from [facility] on Friday as pt will need radiation treatment. Plan is for SNF upon discharge.  CSW met with pt at bedside. Pt is agreeable to SNF. CSW discussed with pt that CSW will re-initiate SNF search with updated clinical information as pt has wound vac and will be undergoing radiation treatments. Pt states that she is hopeful to eventually get back to [place] where her son lives and where she has been on the housing authority waiting list for two years. Pt understands that she will need to remain in [place] at this time given that radiation treatment is starting here in [place]. Pt agreeable to SNF search. Pt confirms that [employer] and Medicaid insurance listed are active. Pt reports that pt son, [name] plans to come to hospital tomorrow and pt request CSW contact pt son today.   CSW contacted pt son, [name] via telephone. CSW introduced self and explained role. Pt son confirmed that he plans to come to hospital tomorrow morning. Pt son wishes to meet with CSW and MD. CSW discussed with pt son plan for SNF for rehab following hospitalization and pt son expressed understanding. Pt son discussed that he wants his mother to get back to the [place] area in the near future and CSW discussed that pt son could work with SNF social worker regarding plan from SNF and if going back to [place] at that time could be arranged since pt will have completed radiation treatment. CSW plans to meet with pt son tomorrow at 10:30 am.  CSW updated pt FL2 and re-initiated SNF Search. CSW to follow up with pt re: SNF bed offers. Pt has [employer] Medicare which requires authorization prior to d/c.   CSW to continue to follow to provide support and assist with pt disposition needs.   Alison Murray, MSW, [place] [facility] [phone]

## 2015-06-12 NOTE — Progress Notes (Signed)
[facility] PROGRESS NOTE  Robin Cantrell [id number] DOB: [birth year] DOA: 06/02/2015 PCP: No primary care provider on file.  brief narrative 70 year old African-American female with no past medical history, currently homeless living in her truck who was admitted on 06/01/2015 with fungating left breast lesion with purulent drainage. CT of the chest showed necrotic breast mass with axillary lymph nodes with liver and adrenal masses suspicious of malignancy. Patient reported having fevers with chills for almost 20 pound weight loss over past few months. [place] surgery and oncology consulted and patient underwent salvage mastectomy of the left breast on 06/02/2015 with wound VAC placed. 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 MRI brain showing numerous bilateral cerebral metastases with mild edema.  HPI/Subjective: Continues to have left visual field defect. No new complaints. Specifically, no cough, dyspnea, nausea, vomiting, abdominal pain.   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  -Oncology following and plans to discuss in tumor board  - Radiation oncology to be consulted for brain radiation. radiation onc recommends transfer to [facility] to start whole brain radiation.   - Patient underwent incision and debridement of the left breast wound on 2/9 with A CELL  placement and VAC. - Plastic surgery following. Continue Wound  care.  Vision loss - loss of vision in left field of vision- left hemianopsia -small 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  Continue Vancomycin and Unasyn.  - 1/2 bottles growing gram-positive cocci on admission (likely a contaminant with diphtheroids). Repeat cultures negative. - TEE shows 2 cm mobile pedunculated mass in the ascending aorta. Unclear if this is infectious vegetation versus associated with malignancy. - ID recommend to continue current antibiotics with plan on treatment for 14 days and further abx upon discharge will be decided by ID.  - HIV and hep C antibody negative -Recommended to discontinue vancomycin which has been done- stop date of Unasyn 2/16- per ID, can switch to orals when ready for discharge  Symptomatic microcytic anemia Progressive drop in hemoglobin (9.3 on admission--> 5.9).   - Transfused 2 units PRBC on 2/9 and improved..  - Iron panel suggest iron deficiency. Received IV iron on 2/4.  Severe protein calorie malnutrition Continue supplements.  Hypokalemia Replenished  DVT prophylaxis: SCDs Diet: Regular   Code Status: Full code Family Communication: none at bedside.  Disposition Plan: transfer to [facility] for brain radiation and onc follow up   Consultants:  plastic Surgery ( Dr Migdalia Dk)  Oncology ( Dr Alen Blew)  ID ( Dr Graylon Good)   radaition onc ( Dr Tammi Klippel)  Procedures:  Salvage mastectomy 2/3  CT chest  MRI brain  TEE  Irrigation and debridement of left breast wound with A CELL placement and VAC  Antibiotics:  IV vancomycin 2/3-2/11   Unasyn 2/3   Objective: Filed Vitals:   06/11/15 2105 06/12/15 0451  BP: 138/74 138/77  Pulse: 78 63  Temp: 98 F (36.7 C) 97.9 F (36.6 C)  Resp: 16 16    Intake/Output Summary (Last 24 hours) at 06/12/15 1259 Last data filed at 06/12/15 1114  Gross per 24 hour  Intake   2000 ml  Output  650 ml  Net   1350 ml   Filed Weights   06/02/15 0445 06/09/15 1834  Weight: 63.4 kg (139 lb 12.4 oz) 64.955 kg (143 lb 3.2 oz)    Exam:   General:  Elderly female in no acute distress  HEENT: no scleral  icterus, moist mucosa  Chest: Dressing over left breast flap with wound VAC in place  CVS: Normal S1 and S2, no murmurs rubs or gallop  GI: Soft, nondistended, nontender, bowel sounds present  Musculoskeletal: Warm, no edema    Data Reviewed: Basic Metabolic Panel:  Recent Labs Lab 06/07/15 0534 06/09/15 0430 06/12/15 0442  NA 140 138 136  K 3.4* 3.8 4.6  CL 104 100* 102  CO2 29 29 25   GLUCOSE 90 102* 167*  BUN <5* <5* 15  CREATININE 0.81 0.87 0.84  CALCIUM 7.8* 7.9* 8.5*   Liver Function Tests:  Recent Labs Lab 06/07/15 0534  AST 22  ALT 18  ALKPHOS 63  BILITOT 0.7  PROT 5.1*  ALBUMIN 1.8*   No results for input(s): LIPASE, AMYLASE in the last 168 hours. No results for input(s): AMMONIA in the last 168 hours. CBC:  Recent Labs Lab 06/07/15 0534 06/09/15 0430 06/12/15 0442  WBC 11.7* 11.4* 18.2*  NEUTROABS 8.2*  --   --   HGB 5.9* 9.0* 8.5*  HCT 18.3* 27.8* 27.0*  MCV 75.6* 80.1 82.6  PLT 441* 444* 536*   Cardiac Enzymes: No results for input(s): CKTOTAL, CKMB, CKMBINDEX, TROPONINI in the last 168 hours. BNP (last 3 results) No results for input(s): BNP in the last 8760 hours.  ProBNP (last 3 results) No results for input(s): PROBNP in the last 8760 hours.  CBG:  Recent Labs Lab 06/06/15 0727 06/07/15 0857 06/08/15 0721 06/09/15 0731 06/12/15 0739  GLUCAP 87 126* 98 87 149*    Recent Results (from the past 240 hour(s))  Culture, blood (Routine X 2) w Reflex to ID Panel     Status: None   Collection Time: 06/03/15  3:00 PM  Result Value Ref Range Status   Specimen Description BLOOD RIGHT ANTECUBITAL  Final   Special Requests IN PEDIATRIC BOTTLE 3CC  Final   Culture NO GROWTH 5 DAYS  Final   Report Status 06/08/2015 FINAL  Final  Culture, blood (Routine X 2) w Reflex to ID Panel     Status: None   Collection Time: 06/03/15  3:05 PM  Result Value Ref Range Status   Specimen Description BLOOD BLOOD RIGHT HAND  Final   Special  Requests IN PEDIATRIC BOTTLE 3CC  Final   Culture NO GROWTH 5 DAYS  Final   Report Status 06/08/2015 FINAL  Final     Studies: Ct Head Wo Contrast  06/10/2015  CLINICAL DATA:  Sudden onset of left visual field loss today. Frontal headache and confusion. Personal history of meningioma. Breast cancer with multiple metastatic lesions of the brain. EXAM: CT HEAD WITHOUT CONTRAST TECHNIQUE: Contiguous axial images were obtained from the base of the skull through the vertex without intravenous contrast. COMPARISON:  MRI brain 06/07/2015 FINDINGS: A calcified meningioma adjacent to the right frontal lobe is stable, measuring 2.9 x 1.4 cm. Extensive subcortical white matter disease represents a combination of chronic microvascular ischemia and vasogenic edema associated with multiple known brain metastases. A new area of confluent cortical hypoattenuation is present in the right occipital lobe compatible with an acute nonhemorrhagic infarct. This is new since the study 3 days ago. The paranasal sinuses and mastoid  air cells are clear. The calvarium is intact. No significant extracranial lesions are present. IMPRESSION: 1. Acute nonhemorrhagic infarct of the right occipital lobe corresponding with the patient's left hemi nap CF. 2. Extensive white matter hypoattenuation representing a combination of chronic microvascular ischemia and vasogenic edema associated with the patient's extensive metastatic disease to the brain. 3. Right-sided meningioma. Electronically Signed   By: San Morelle M.D.   On: 06/10/2015 16:40    Scheduled Meds: . sodium chloride   Intravenous Once  . ampicillin-sulbactam (UNASYN) IV  3 g Intravenous Q6H  . dexamethasone  4 mg Intravenous Q12H  . diphenhydrAMINE  25 mg Oral QHS  . docusate sodium  200 mg Oral Daily  . feeding supplement  1 Container Oral TID BM  . feeding supplement (ENSURE ENLIVE)  237 mL Oral BID BM  . simvastatin  10 mg Oral q1800  . sodium chloride flush   3 mL Intravenous Q12H   Continuous Infusions:     Time spent: 25 minutes    [place]  [facility] Pager: [email or web address] If 7PM-7AM, please contact night-coverage at [email or web address], password [facility] 06/12/2015, 12:59 PM  LOS: 10 days

## 2015-06-12 NOTE — Clinical Social Work Placement (Signed)
   CLINICAL SOCIAL WORK PLACEMENT  NOTE  Date:  06/12/2015  Patient Details  Name: Robin Cantrell MRN: [id number] Date of Birth: [birth year]  Clinical Social Work is seeking post-discharge placement for this patient at the [place] level of care (*CSW will initial, date and re-position this form in  chart as items are completed):  Yes   Patient/family provided with [facility] Work Department's list of facilities offering this level of care within the geographic area requested by the patient (or if unable, by the patient's family).  Yes   Patient/family informed of their freedom to choose among providers that offer the needed level of care, that participate in Medicare, Medicaid or managed care program needed by the patient, have an available bed and are willing to accept the patient.  Yes   Patient/family informed of [facility]'s ownership interest in [facility] and [facility], as well as of the fact that they are under no obligation to receive care at these facilities.  PASRR submitted to EDS on 06/12/15     PASRR number received on 06/12/15     Existing PASRR number confirmed on       FL2 transmitted to all facilities in geographic area requested by pt/family on 06/12/15     FL2 transmitted to all facilities within larger geographic area on       Patient informed that his/her managed care company has contracts with or will negotiate with certain facilities, including the following:        Yes   Patient/family informed of bed offers received.  Patient chooses bed at [facility]     Physician recommends and patient chooses bed at      Patient to be transferred to   on  .  Patient to be transferred to facility by       Patient family notified on   of transfer.  Name of family member notified:        PHYSICIAN Please prepare priority discharge summary, including medications, Please prepare prescriptions, Please sign  FL2     Additional Comment:    _______________________________________________ Ladell Pier, LCSW 06/12/2015, 4:05 PM

## 2015-06-12 NOTE — Progress Notes (Signed)
Radiation Oncology         [phone] ________________________________  Initial inpatient Consultation  Name: Robin Cantrell MRN: [id number]  Date: 06/02/2015  DOB: May 24, 1945  CC:No primary care provider on file.  No ref. provider found   REFERRING PHYSICIAN: No ref. provider found  DIAGNOSIS: The primary encounter diagnosis was 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 ([place]). 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), Malignant neoplasm of central portion of left female breast ([place]), Breast wound, left, initial encounter, and Vision loss were also pertinent to this visit.    ICD-9-CM ICD-10-CM   1. 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 (HCC) 174.8 C50.812   2. 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HCC) 444.1 I74.10   3. 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 Aorta disorder (HCC) 447.9 I77.9 CT ANGIO CHEST AORTA W/CM &/OR WO/CM     CT ANGIO CHEST AORTA W/CM &/OR WO/CM  5. Meningeal metastases (HCC) 198.4 C79.49 MR Brain W Wo Contrast     MR Brain W Wo Contrast     CANCELED: MR Brain W Contrast     CANCELED: MR Brain W Contrast  6. Malignant neoplasm of central portion of left female breast ([place]) 174.1 C50.112 Informed Consent Details: Transcribe to consent form and obtain patient signature     Labs per anesthesia     Informed Consent Details: Transcribe to consent form and obtain patient signature     Labs per anesthesia     CANCELED: Diet NPO time specified     CANCELED: SCD's     CANCELED: SCD's  7. Breast wound, left, initial encounter 879.0 S21.002A Informed Consent Details: Transcribe to consent form and obtain patient signature     Labs per anesthesia     Informed Consent Details: Transcribe to consent form and obtain patient signature     Labs per anesthesia     CANCELED: Diet NPO time  specified     CANCELED: SCD's     CANCELED: SCD's  8. Vision loss 369.9 H54.7 CT Head Wo Contrast     CT Head Wo Contrast    HISTORY OF PRESENT ILLNESS: Robin Cantrell is a 70 y.o. female seen at the request of Dr. 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 Patient presented to the emergency department with a tumor returning to her left breast. She underwent simple left mastectomy on 06/02/2015, and had a wound VAC placed. Her final pathology revealed a grade 3 invasive ductal carcinoma measuring 14.7 cm involving the skin with lymphovascular invasion and negative resection margins. Tumor was present in 2 of the 4 axillary nodes sampled. The patient underwent staging CT scans, and a CT of the brain with and without contrast on 06/03/2015 revealed metastatic disease to the brain with 6 round enhancing masses in both hemispheres about 7 mm in size with the largest at 1.4 cm. She also had a 47 m high per dense enhancing mass in the right operculum. No evidence of metastatic disease was seen of the chest abdomen or pelvis, follow-up study to the brain with MR on 06/07/2015 revealed numerous bilateral cerebral metastases with mild edema but no mass effect. A single small cerebellar metastasis was also noted without edema, and a 3.27 m extra-axial lesion of the level of the operculum on the right was noted. Small  vessel ischemic disease was present. She also underwent a CT scan of the brain without contrast on 06/10/2015 due to some loss of left visual field. She had experienced frontal headache and confusion, and acute nonhemorrhagic infarct in the right occipital lobe with extensive white matter hypoattenuation representing chronic microvascular ischemia and vasogenic edema was present. The right-sided meningioma as previously outlined was again seen in the right frontal lobe. She has been on dexamethasone 4 mg twice daily since her brain disease is identified and we are  asked to see the patient today for recommendations of care. Dr. Tammi Klippel has previously reviewed her case and has recommended whole brain radiation therapy.  PREVIOUS RADIATION THERAPY: No  PAST MEDICAL HISTORY:  has no past medical history on file.    PAST SURGICAL HISTORY: Past Surgical History  Procedure Laterality Date  . Ankle fracture surgery    . Total mastectomy Left 06/02/2015    Procedure: LEFT TOTAL MASTECTOMY;  Surgeon: Donnie Mesa, MD;  Location: [facility];  Service: General;  Laterality: Left;  . Tee without cardioversion N/A 06/06/2015    Procedure: TRANSESOPHAGEAL ECHOCARDIOGRAM (TEE);  Surgeon: Skeet Latch, MD;  Location: [facility];  Service: Cardiovascular;  Laterality: N/A;  . Incision and drainage of wound Left 06/08/2015    Procedure: IRRIGATION AND DEBRIDEMENT LEFT BREAST WOUND WITH A CELL PLACEMENT AND VAC;  Surgeon: Loel Lofty Dillingham, DO;  Location: [facility];  Service: Plastics;  Laterality: Left;  . Application of a-cell of chest/abdomen Left 06/08/2015    Procedure: WITH A CELL PLACEMENT;  Surgeon: Loel Lofty Dillingham, DO;  Location: [facility];  Service: Plastics;  Laterality: Left;  . Application of wound vac Left 06/08/2015    Procedure: AND VAC;  Surgeon: Loel Lofty Dillingham, DO;  Location: [facility];  Service: Plastics;  Laterality: Left;    FAMILY HISTORY: family history is not on file.  SOCIAL HISTORY:  Social History   Social History  . Marital Status: Single    Spouse Name: N/A  . Number of Children: N/A  . Years of Education: N/A   Occupational History  . Not on file.   Social History Main Topics  . Smoking status: Former Smoker    Types: Cigarettes    Quit date: 06/04/1993  . Smokeless tobacco: Not on file  . Alcohol Use: No  . Drug Use: No  . Sexual Activity: Not on file   Other Topics Concern  . Not on file   Social History Narrative  The patient reports that for the last 6 months she's been a unable to afford the change in her rent and has  been living in her truck and a Paediatric nurse parking lot. She has 2 adult children both of whom live in [place]. Her son will be coming today to meet with social work and hoping to help plan for transition to skilled facility until her treatment can be completed from radiation.  ALLERGIES: Review of patient's allergies indicates no known allergies.  MEDICATIONS:  Current Facility-Administered Medications  Medication Dose Route Frequency Provider Last Rate Last Dose  . 0.9 %  sodium chloride infusion   Intravenous Once Dianne Dun, NP      . acetaminophen (TYLENOL) tablet 650 mg  650 mg Oral Q6H PRN Ivor Costa, MD       Or  . acetaminophen (TYLENOL) suppository 650 mg  650 mg Rectal Q6H PRN Ivor Costa, MD      . alum & mag hydroxide-simeth (MAALOX/MYLANTA) 200-200-20 MG/5ML suspension 30  mL  30 mL Oral Q6H PRN Ivor Costa, MD      . Ampicillin-Sulbactam (UNASYN) 3 g in sodium chloride 0.9 % 100 mL IVPB  3 g Intravenous Q6H Ivor Costa, MD   3 g at 06/12/15 0554  . dexamethasone (DECADRON) injection 4 mg  4 mg Intravenous Q12H Debbe Odea, MD   4 mg at 06/11/15 2205  . diphenhydrAMINE (BENADRYL) capsule 25 mg  25 mg Oral QHS Debbe Odea, MD   25 mg at 06/11/15 2304  . docusate sodium (COLACE) capsule 200 mg  200 mg Oral Daily Debbe Odea, MD   200 mg at 06/11/15 1142  . feeding supplement (BOOST / RESOURCE BREEZE) liquid 1 Container  1 Container Oral TID BM San Leanna, DO   1 Container at 06/11/15 2051  . feeding supplement (ENSURE ENLIVE) (ENSURE ENLIVE) liquid 237 mL  237 mL Oral BID BM Nita Sells, MD   237 mL at 06/09/15 1402  . ondansetron (ZOFRAN) tablet 4 mg  4 mg Oral Q6H PRN Ivor Costa, MD       Or  . ondansetron [facility]) injection 4 mg  4 mg Intravenous Q6H PRN Ivor Costa, MD      . oxyCODONE-acetaminophen (PERCOCET/ROXICET) 5-325 MG per tablet 1-2 tablet  1-2 tablet Oral Q4H PRN Erby Pian, NP   2 tablet at 06/12/15 0009  . simvastatin (ZOCOR) tablet 10 mg  10 mg  Oral q1800 Debbe Odea, MD   10 mg at 06/11/15 1831  . sodium chloride flush (NS) 0.9 % injection 3 mL  3 mL Intravenous Q12H Ivor Costa, MD   3 mL at 06/11/15 2305  . vancomycin (VANCOCIN) IVPB 750 mg/150 ml premix  750 mg Intravenous Q12H Eudelia Bunch, RPH   750 mg at 06/12/15 0104    REVIEW OF SYSTEMS:  On review of systems the patient reports that overall he is doing okay. She states that she has had left field loss in her vision particularly with looking at computer screens or TV screens. She is able to read words on a page however. She states that she has not experienced any double vision blurred vision, auditory disturbance, or headache. She denies any nausea or vomiting. She is not expensing abdominal pain, chest pain, shortness of breath, fevers or chills. She denies any unintended weight changes. She is not experiencing bowel or bladder dysfunction at this time. A complete review of systems is obtained and is otherwise negative   PHYSICAL EXAM:  height is 5\' 8"  (1.727 m) and weight is 143 lb 3.2 oz (64.955 kg). Her oral temperature is 97.9 F (36.6 C). Her blood pressure is 138/77 and her pulse is 63. Her respiration is 16 and oxygen saturation is 100%.   Pain Scale 0/10  In general this is a well-appearing African-American female in no acute distress. She is alert and oriented 4 and ring the majority of the assessment is appropriate throughout the examination. She does make some comments about being concerned about losing her vision from watching TV, and in discussing side effects of radiation therapy brings up the fact that she thinks that hair loss from cancer related treatments is a legal issue. Cardiovascular exam reveals a regular rate and rhythm, no clicks rubs or murmurs are auscultated. Chest is to auscultation bilaterally. No palpable adenopathy is noted of the supraclavicular or cervical nodes. Lower extremities are negative for pretibial pitting edema deep calf tenderness. HEENT  reveals normocephalic atraumatic EOMs are intact. PERRLA. She appears  to be grossly intact from a neurologic perspective and does not have any gross evidence of changes and motor strength.  KPS = 90  100 - Normal; no complaints; no evidence of disease. 90   - Able to carry on normal activity; minor signs or symptoms of disease. 80   - Normal activity with effort; some signs or symptoms of disease. 60   - Cares for self; unable to carry on normal activity or to do active work. 60   - Requires occasional assistance, but is able to care for most of his personal needs. 50   - Requires considerable assistance and frequent medical care. 33   - Disabled; requires special care and assistance. 70   - Severely disabled; hospital admission is indicated although death not imminent. 32   - Very sick; hospital admission necessary; active supportive treatment necessary. 10   - Moribund; fatal processes progressing rapidly. 0     - Dead  Karnofsky DA, Abelmann [place], Craver LS and Burchenal JH [phone]) The use of the nitrogen mustards in the palliative treatment of carcinoma: with particular reference to bronchogenic carcinoma Cancer 1 634-56  LABORATORY DATA:  Lab Results  Component Value Date   WBC 18.2* 06/12/2015   HGB 8.5* 06/12/2015   HCT 27.0* 06/12/2015   MCV 82.6 06/12/2015   PLT 536* 06/12/2015   Lab Results  Component Value Date   NA 136 06/12/2015   K 4.6 06/12/2015   CL 102 06/12/2015   CO2 25 06/12/2015   Lab Results  Component Value Date   ALT 18 06/07/2015   AST 22 06/07/2015   ALKPHOS 63 06/07/2015   BILITOT 0.7 06/07/2015     RADIOGRAPHY: Ct Head Wo Contrast  06/10/2015  CLINICAL DATA:  Sudden onset of left visual field loss today. Frontal headache and confusion. Personal history of meningioma. Breast cancer with multiple metastatic lesions of the brain. EXAM: CT HEAD WITHOUT CONTRAST TECHNIQUE: Contiguous axial images were obtained from the base of the skull through the  vertex without intravenous contrast. COMPARISON:  MRI brain 06/07/2015 FINDINGS: A calcified meningioma adjacent to the right frontal lobe is stable, measuring 2.9 x 1.4 cm. Extensive subcortical white matter disease represents a combination of chronic microvascular ischemia and vasogenic edema associated with multiple known brain metastases. A new area of confluent cortical hypoattenuation is present in the right occipital lobe compatible with an acute nonhemorrhagic infarct. This is new since the study 3 days ago. The paranasal sinuses and mastoid air cells are clear. The calvarium is intact. No significant extracranial lesions are present. IMPRESSION: 1. Acute nonhemorrhagic infarct of the right occipital lobe corresponding with the patient's left hemi nap CF. 2. Extensive white matter hypoattenuation representing a combination of chronic microvascular ischemia and vasogenic edema associated with the patient's extensive metastatic disease to the brain. 3. Right-sided meningioma. Electronically Signed   By: San Morelle M.D.   On: 06/10/2015 16:40   Ct Head W Wo Contrast  06/03/2015  CLINICAL DATA:  70 year old female with newly diagnosed breast cancer status post mastectomy yesterday. Subsequent encounter. EXAM: CT HEAD WITHOUT AND WITH CONTRAST TECHNIQUE: Contiguous axial images were obtained from the base of the skull through the vertex without and with intravenous contrast CONTRAST:  4mL OMNIPAQUE IOHEXOL 350 MG/ML SOLN COMPARISON:  Chest CT 06/02/2015. FINDINGS: Mild right mastoid effusion. Negative visible nasopharynx. Other Visualized paranasal sinuses and mastoids are clear. No acute or suspicious osseous lesion identified in the visible skull. Visualized scalp soft tissues are  within normal limits. Along the right lateral hemisphere there is a 3.3 cm length 1.4 cm with peripheral, dural based appearing hyperdense lesion with enhancement at the level of the operculum (series 4, image 15).  Despite this lesions size, there is no definite associated edema. There is patchy and confluent bilateral cerebral white matter hypodensity which might in part be small vessel related, however, following contrast there are multiple subcentimeter round enhancing lesions (most about 7 mm diameter) identified in the right frontal lobe (series 6, images 13 and 14), left frontal lobe, and posterior left temporal lobe. The largest of these lesions is a 14 mm left operculum metastasis on series 6, image 17. Associated vasogenic edema but no significant mass effect. In all 6 such lesions are identified. Despite these findings there is no intracranial mass effect. Basilar cisterns remain patent. No chronic cortically based infarct identified. No ventriculomegaly. No acute intracranial hemorrhage identified. Major intracranial vascular structures are enhancing. IMPRESSION: 1. Positive for metastatic disease to the brain. At least 6 round enhancing brain masses in both hemispheres, most are small - about 7 mm in size while the largest is 14 mm. Some vasogenic edema but no significant intracranial mass effect. 2. Superimposed moderate-sized 3 cm right operculum extra-axial appearing hyperdense an enhancing mass. Although dural based metastasis is possible this may instead reflect a superimposed benign meningioma. 3. Follow-up brain MRI without and with contrast may clarify the diagnosis of #2 and detect additional small brain metastases. Electronically Signed   By: Genevie Ann M.D.   On: 06/03/2015 13:41   Ct Chest W Contrast  06/02/2015  CLINICAL DATA:  Evaluate for left breast mass versus malignancy. EXAM: CT CHEST WITH CONTRAST TECHNIQUE: Multidetector CT imaging of the chest was performed during intravenous contrast administration. CONTRAST:  51mL OMNIPAQUE IOHEXOL 300 MG/ML  SOLN COMPARISON:  None. FINDINGS: THORACIC INLET/BODY WALL: The entire left breast is affected by a broad-based ulcerated mass with peripheral  enhancement, heaped up margins, and nonenhancing debris in the ulcer base. Maximal diameter is approximately 13 cm. No drainable fluid collection. This is has the appearance of a necrotic malignancy. There is likely involvement of the pectoralis muscles but no bony invasion noted. Left axillary nodes are within normal limits in size, but asymmetric and suspicious soley based on the size of breast mass. No right breast lesion is seen. MEDIASTINUM: No cardiomegaly or pericardial effusion. Atherosclerosis, including the coronary arteries. There is a hypodense filling defect attached to the left wall of the ascending aorta which measures up to 3 cm. On reformats, this appears to have a narrow base along the wall which is not otherwise remarkable for thickening or calcification. Atherosclerosis is present along the aorta and great vessels, without dissection or other aerated notable mural thrombus. There is no indication of valvular disease/vegetation. No lymphadenopathy, including along the left IMA distribution LUNG WINDOWS: Mild centrilobular emphysema. There is no edema, consolidation, effusion, or pneumothorax. No suspicious nodule. UPPER ABDOMEN: There is a 10 mm low-density in the upper left liver which does not measured cystic density. Indeterminate 13 mm right adrenal mass. OSSEOUS: No acute fracture.  No suspicious lytic or blastic lesions. IMPRESSION: 1. 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 As questioned clinically, no abscess.Small but asymmetric left axillary lymph nodes. 2. 3 cm vegetation attached to the ascending aortic wall. This would be unusual for bland atherosclerotic thrombus, correlate for systemic infection/bacteremia. 3. Indeterminate liver and right adrenal masses. Recommend abdominal staging after workup of #1. Electronically Signed  By: Monte Fantasia M.D.   On: 06/02/2015 03:24   Mr Jeri Cos [id number] Contrast  06/07/2015  CLINICAL DATA:  Newly diagnosed left breast cancer status  post mastectomy on 06/02/2015. Staging. Brain metastases on recent head CT. EXAM: MRI HEAD WITHOUT AND WITH CONTRAST TECHNIQUE: Multiplanar, multiecho pulse sequences of the brain and surrounding structures were obtained without and with intravenous contrast. CONTRAST:  8mL MULTIHANCE GADOBENATE DIMEGLUMINE 529 MG/ML IV SOLN COMPARISON:  06/03/2015 head CT FINDINGS: There is no evidence of acute infarct, midline shift, or extra-axial fluid collection. Patchy to confluent T2 hyperintensities are present throughout the subcortical and deep cerebral white matter bilaterally and reflect in part vasogenic edema as well as likely age advanced chronic small vessel ischemia. There is mild cerebral atrophy which is within normal limits for age. Chronic microhemorrhages are scattered throughout both cerebral and cerebellar hemispheres as well as in the thalami and may be due to hypertension. A small amount of blood products are associated with some of the metastases, most notably the largest left frontal lesion. There are numerous enhancing lesions throughout both cerebral hemispheres which are primarily located at the gray-white junction and number greater than 30. The largest lesion on the left is located in the frontal lobe and measures 2.5 x 1.5 cm (series 11, image 36). The largest lesion on the right measures 1.5 x 1.0 cm in the right occipital lobe (series 11, image 23). A single cerebellar lesion measures 5 mm inferiorly on the left (series 11, image 8) without significant edema. As noted on CT, there is also an extra-axial lesion with broad dural interface overlying the right cerebral convexity at the level of the operculum. This demonstrates homogeneous enhancement and measures 3.2 x 1.4 cm (series 11, image 30) without evidence of underlying brain edema. No other enhancing extra-axial lesions are identified. Orbits are unremarkable. There is a small right mastoid effusion. Paranasal sinuses are clear. Major  intracranial vascular flow voids are preserved. Calvarial bone marrow signal is diminished diffusely without a suspicious focal lesion identified, compatible with known underlying anemia. IMPRESSION: 1. Numerous bilateral cerebral metastases as above. Mild edema associated with many of these lesions but no significant mass effect. 2. Single, small cerebellar metastasis without edema. 3. 3.2 cm extra-axial lesion at the level of the operculum on the right. A meningioma is favored over solitary dural metastasis. 4. Age advanced chronic small vessel ischemic disease and chronic microhemorrhages. Electronically Signed   By: Logan Bores M.D.   On: 06/07/2015 14:25   Ct Abdomen Pelvis W Contrast  06/03/2015  CLINICAL DATA:  Patient status post LEFT mastectomy for metastatic breast cancer. Patient did lesion discovered on CT exam from 06/02/2015. EXAM: CT ANGIOGRAPHY CHEST CT ABDOMEN AND PELVIS WITH CONTRAST TECHNIQUE: Multidetector CT imaging of the chest was performed using the standard protocol during bolus administration of intravenous contrast. Multiplanar CT image reconstructions and MIPs were obtained to evaluate the vascular anatomy. Multidetector CT imaging of the abdomen and pelvis was performed using the standard protocol during bolus administration of intravenous contrast. CONTRAST:  80 mL Omnipaque COMPARISON:  CT 06/02/2015. FINDINGS: CTA CHEST FINDINGS Mediastinum/Nodes: Again demonstrated a irregular mass within the ascending aorta with a stalk like attachment to the aortic wall seen best on image 72, series 12 sagittal series. At its attachment the lesion puckers the wall of the aorta. The lesion is difficult to measure but measure approximately 1.4 cm (image 60, series 8). There is abnormal thickening of the adventitia of the aorta  of throughout the aorta. No additional lesions are present within the aorta. There is uniform thickening of the adventitia throughout aorta. Great vessels are normal. No  pulmonary embolism. No pericardial fluid. The LEFT and RIGHT ventricle appear normal. Normal atria Lungs/Pleura: No pulmonary nodularity suggest metastasis. Small bilateral pleural effusions. Musculoskeletal: A postsurgical change in the LEFT chest wall consistent with mastectomy. CT ABDOMEN and PELVIS FINDINGS Hepatobiliary: Small hypodense lesion in the superior aspect of the LEFT hepatic lobe has low attenuation consistent with a cyst. No biliary duct dilatation. Gallbladder normal. Pancreas: Pancreas is normal. No ductal dilatation. No pancreatic inflammation. Spleen: Normal spleen Adrenals/urinary tract: There is bilateral nodular adrenal thickening. The lesion of concern on comparison CT is felt to represent a nodule enlargement of the medial limb of the RIGHT adrenal gland to 13 mm. . No renal obstruction. Ureters are normal. On the lateral projection there is suggestion hypodensity within the dome the bladder which is felt to represent streak artifact from adjacent contrast within the bowel. Stomach/Bowel: Stomach, small bowel, appendix are normal. There is diffuse vessel thickening in the ascending colon without pericolonic inflammation. No mass lesion or obstruction. The LEFT colon and rectosigmoid colon are normal. Vascular/Lymphatic: Abdominal aorta is normal caliber. There is no retroperitoneal or periportal lymphadenopathy. No pelvic lymphadenopathy. Reproductive: Uterus and ovaries are normal. Other: No free fluid. Musculoskeletal: No aggressive osseous lesion. Review of the MIP images confirms the above findings. IMPRESSION: Chest Impression: 1. Pedunculated lesion within the ascending aorta with differential including infectious versus neoplastic etiology. Transesophageal echocardiography may provide further characterization. 2. Uniform adventitial thickening involving the entirety the thoracic aorta. 3. No evidence of pulmonary metastasis. 4. Postsurgical changes in the LEFT chest wall Abdomen /  Pelvis Impression: 1. Small hypodense lesion in the liver likely represents benign cyst. 2. Nodular enlargement of the RIGHT adrenal gland is indeterminate. Consider FDG PET scan for complete staging. 3. No metastatic disease identified elsewhere in the abdomen or pelvis. 4. Mucosal thickening in the ascending colon is nonspecific but suggests segmental colitis. Electronically Signed   By: Suzy Bouchard M.D.   On: 06/03/2015 13:57   Ct Angio Chest Aorta W/cm &/or Wo/cm  06/03/2015  CLINICAL DATA:  Patient status post LEFT mastectomy for metastatic breast cancer. Patient did lesion discovered on CT exam from 06/02/2015. EXAM: CT ANGIOGRAPHY CHEST CT ABDOMEN AND PELVIS WITH CONTRAST TECHNIQUE: Multidetector CT imaging of the chest was performed using the standard protocol during bolus administration of intravenous contrast. Multiplanar CT image reconstructions and MIPs were obtained to evaluate the vascular anatomy. Multidetector CT imaging of the abdomen and pelvis was performed using the standard protocol during bolus administration of intravenous contrast. CONTRAST:  80 mL Omnipaque COMPARISON:  CT 06/02/2015. FINDINGS: CTA CHEST FINDINGS Mediastinum/Nodes: Again demonstrated a irregular mass within the ascending aorta with a stalk like attachment to the aortic wall seen best on image 72, series 12 sagittal series. At its attachment the lesion puckers the wall of the aorta. The lesion is difficult to measure but measure approximately 1.4 cm (image 60, series 8). There is abnormal thickening of the adventitia of the aorta of throughout the aorta. No additional lesions are present within the aorta. There is uniform thickening of the adventitia throughout aorta. Great vessels are normal. No pulmonary embolism. No pericardial fluid. The LEFT and RIGHT ventricle appear normal. Normal atria Lungs/Pleura: No pulmonary nodularity suggest metastasis. Small bilateral pleural effusions. Musculoskeletal: A postsurgical  change in the LEFT chest wall consistent with mastectomy. CT  ABDOMEN and PELVIS FINDINGS Hepatobiliary: Small hypodense lesion in the superior aspect of the LEFT hepatic lobe has low attenuation consistent with a cyst. No biliary duct dilatation. Gallbladder normal. Pancreas: Pancreas is normal. No ductal dilatation. No pancreatic inflammation. Spleen: Normal spleen Adrenals/urinary tract: There is bilateral nodular adrenal thickening. The lesion of concern on comparison CT is felt to represent a nodule enlargement of the medial limb of the RIGHT adrenal gland to 13 mm. . No renal obstruction. Ureters are normal. On the lateral projection there is suggestion hypodensity within the dome the bladder which is felt to represent streak artifact from adjacent contrast within the bowel. Stomach/Bowel: Stomach, small bowel, appendix are normal. There is diffuse vessel thickening in the ascending colon without pericolonic inflammation. No mass lesion or obstruction. The LEFT colon and rectosigmoid colon are normal. Vascular/Lymphatic: Abdominal aorta is normal caliber. There is no retroperitoneal or periportal lymphadenopathy. No pelvic lymphadenopathy. Reproductive: Uterus and ovaries are normal. Other: No free fluid. Musculoskeletal: No aggressive osseous lesion. Review of the MIP images confirms the above findings. IMPRESSION: Chest Impression: 1. Pedunculated lesion within the ascending aorta with differential including infectious versus neoplastic etiology. Transesophageal echocardiography may provide further characterization. 2. Uniform adventitial thickening involving the entirety the thoracic aorta. 3. No evidence of pulmonary metastasis. 4. Postsurgical changes in the LEFT chest wall Abdomen / Pelvis Impression: 1. Small hypodense lesion in the liver likely represents benign cyst. 2. Nodular enlargement of the RIGHT adrenal gland is indeterminate. Consider FDG PET scan for complete staging. 3. No metastatic disease  identified elsewhere in the abdomen or pelvis. 4. Mucosal thickening in the ascending colon is nonspecific but suggests segmental colitis. Electronically Signed   By: Suzy Bouchard M.D.   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  PLAN: Dr. Tammi Klippel has recommended proceeding with whole brain radiation therapy with a total of 30 Gy to the brain over 10 fractions. The patient will be transitioning to a skilled facility once we have proceeded with her treatment plan, and she will finish the course of her treatment as an outpatient. We will plan for simulation today in the Department with the anticipation of starting in the next couple of days.   Carola Rhine, PAC

## 2015-06-12 NOTE — Progress Notes (Signed)
Spoke with Dr. Marla Roe regarding when the wound vac will be changed as patient thought it was due to be changed today.  Per Dr. Marla Roe she or her associate will change the wound vac in approximately a week after placement.  Wound vac placed on the 9th therefore will not be due to be changed until February 16th.

## 2015-06-12 NOTE — Clinical Social Work Placement (Signed)
   CLINICAL SOCIAL WORK PLACEMENT  NOTE  Date:  06/12/2015  Patient Details  Name: Beonka Kennerly MRN: [id number] Date of Birth: October 10, 1945  Clinical Social Work is seeking post-discharge placement for this patient at the [place] level of care (*CSW will initial, date and re-position this form in  chart as items are completed):  Yes   Patient/family provided with [facility] Work Department's list of facilities offering this level of care within the geographic area requested by the patient (or if unable, by the patient's family).  Yes   Patient/family informed of their freedom to choose among providers that offer the needed level of care, that participate in Medicare, Medicaid or managed care program needed by the patient, have an available bed and are willing to accept the patient.  Yes   Patient/family informed of [facility]'s ownership interest in [facility] and [facility], as well as of the fact that they are under no obligation to receive care at these facilities.  PASRR submitted to EDS on 06/12/15     PASRR number received on 06/12/15     Existing PASRR number confirmed on       FL2 transmitted to all facilities in geographic area requested by pt/family on 06/12/15     FL2 transmitted to all facilities within larger geographic area on       Patient informed that his/her managed care company has contracts with or will negotiate with certain facilities, including the following:            Patient/family informed of bed offers received.  Patient chooses bed at       Physician recommends and patient chooses bed at      Patient to be transferred to   on  .  Patient to be transferred to facility by       Patient family notified on   of transfer.  Name of family member notified:        PHYSICIAN Please prepare priority discharge summary, including medications, Please prepare prescriptions, Please sign FL2     Additional  Comment:    _______________________________________________ Ladell Pier, LCSW 06/12/2015, 10:36 AM

## 2015-06-12 NOTE — Progress Notes (Signed)
Physical Therapy Treatment Patient Details Name: Robin Cantrell MRN: [id number] DOB: Jan 01, 1946 Today's Date: 06/12/2015    History of Present Illness patient is a 70 yo female who presents with Infection of left breast, possible left breast cancer and vegetation in ascending aorta.  Pt s/p L radical mastectomy and wound vac placement on 06/02/15 and is thought to have mets (liver and adrenal masses), possibly to the brain.  Oncology following.      PT Comments    Requires multimodal cues for safety  During  Mobility and ambulation, very impulsive and risk for fall and pulling lines/VAC.  Decreased fine motor of the L hand noted. . Tends to just use the Right such as drying hands did not present L hand to to wash and dry, neglected somewhat.  Follow Up Recommendations  SNF     Equipment Recommendations  Rolling walker with 5" wheels    Recommendations for Other Services       Precautions / Restrictions Precautions Precaution Comments: wound vac    Mobility  Bed Mobility         Supine to sit: Supervision Sit to supine: Supervision   General bed mobility comments: supervision for safety, impulosive and getting tangled in lines  Transfers Overall transfer level: Needs assistance Equipment used: Rolling walker (2 wheeled) Transfers: Sit to/from Stand Sit to Stand: Supervision         General transfer comment: cues for safety with lines.  Ambulation/Gait Ambulation/Gait assistance: Min assist Ambulation Distance (Feet): 400 Feet   Gait Pattern/deviations: Step-through pattern;Step-to pattern;Drifts right/left;Staggering right Gait velocity: increased , cues to slow down   General Gait Details: pushing IV pole. Cues for safety, declined to use the RW, patient swerving throughout  the halls, cues for safety as she gets tangled in the klines, is impulsive   Stairs            Wheelchair Mobility    Modified Rankin (Stroke Patients Only)       Balance            Standing balance support: During functional activity;No upper extremity supported   Standing balance comment: cues for safety                    Cognition Arousal/Alertness: Awake/alert Behavior During Therapy: Impulsive Overall Cognitive Status: No family/caregiver present to determine baseline cognitive functioning Area of Impairment: Memory;Following commands;Safety/judgement;Problem solving;Awareness   Current Attention Level: Sustained Memory: Decreased short-term memory Following Commands: Follows one step commands inconsistently Safety/Judgement: Decreased awareness of safety;Decreased awareness of deficits Awareness: Emergent Problem Solving: Difficulty sequencing;Requires verbal cues;Requires tactile cues;Slow processing      Exercises      General Comments        Pertinent Vitals/Pain Faces Pain Scale: No hurt    Home Living                      Prior Function            PT Goals (current goals can now be found in the care plan section) Progress towards PT goals: Progressing toward goals    Frequency  Min 2X/week    PT Plan Current plan remains appropriate    Co-evaluation             End of Session   Activity Tolerance: Patient tolerated treatment well Patient left: in bed;with call bell/phone within reach;with bed alarm set;with nursing/sitter in room     Time: [id number] PT Time  Calculation (min) (ACUTE ONLY): 16 min  Charges:  $Gait Training: 8-22 mins                    G Codes:      Claretha Cooper 06/12/2015, 10:38 AM Tresa Endo PT [phone]

## 2015-06-12 NOTE — NC FL2 (Signed)
[place] LEVEL OF CARE SCREENING TOOL     IDENTIFICATION  Patient Name: Robin Cantrell Birthdate: [birth year] Sex: female Admission Date (Current Location): 06/02/2015  [place] and [place] Number:  Kathleen Argue [id number] [place] and Address:  [facility],  [place] [address], [place]      Provider Number: [id number]  Attending Physician Name and Address:  Debbe Odea, MD  Relative Name and Phone Number:       Current Level of Care: Hospital Recommended Level of Care: [place] Prior Approval Number:    Date Approved/Denied:   PASRR Number: [id number]  Discharge Plan: SNF    Current Diagnoses: Patient Active Problem List   Diagnosis Date Noted  . Malignant neoplasm of central portion of left female breast ([place])   . Bacteremia   . 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 ([place])   . 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 ([place])   . Left breast mass 06/02/2015  . Breast cancer ([place]) 06/02/2015  . AKI (acute kidney injury) ([place]) 06/02/2015  . Infection of left breast 06/02/2015  . Breast infection 06/02/2015  . Homeless 06/02/2015  . 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[facility])     Orientation RESPIRATION BLADDER Height & Weight     Self, Time, Situation, Place  Normal Continent (urgency at times) Weight: 143 lb 3.2 oz (64.955 kg) Height:  5\' 8"  (172.7 cm)  BEHAVIORAL SYMPTOMS/MOOD NEUROLOGICAL BOWEL NUTRITION STATUS   (n/a)  (NONE) Continent Diet (Diet Regular)  AMBULATORY STATUS COMMUNICATION OF NEEDS Skin   Limited Assist Verbally Wound Vac, Surgical wounds (Negative Pressure Wound VAC to Left Breast 125 mm/HG Continuous frequency change Mon, Wed, Fri)                       Personal Care Assistance Level of Assistance  Bathing, Feeding, Dressing Bathing Assistance: Limited assistance Feeding assistance: Independent Dressing Assistance: Limited assistance     Functional Limitations Info   Sight, Hearing, Speech Sight Info: Adequate Hearing Info: Adequate Speech Info: Adequate    SPECIAL CARE FACTORS FREQUENCY  PT (By licensed PT), OT (By licensed OT)     PT Frequency: 5 x a week OT Frequency: 5 x a week            Contractures Contractures Info: Not present    Additional Factors Info  Code Status Code Status Info: FULL code status             Current Medications (06/12/2015):  This is the current hospital active medication list Current Facility-Administered Medications  Medication Dose Route Frequency Provider Last Rate Last Dose  . 0.9 %  sodium chloride infusion   Intravenous Once Dianne Dun, NP      . acetaminophen (TYLENOL) tablet 650 mg  650 mg Oral Q6H PRN Ivor Costa, MD       Or  . acetaminophen (TYLENOL) suppository 650 mg  650 mg Rectal Q6H PRN Ivor Costa, MD      . alum & mag hydroxide-simeth (MAALOX/MYLANTA) 200-200-20 MG/5ML suspension 30 mL  30 mL Oral Q6H PRN Ivor Costa, MD      . Ampicillin-Sulbactam (UNASYN) 3 g in sodium chloride 0.9 % 100 mL IVPB  3 g Intravenous Q6H Ivor Costa, MD   3 g at 06/12/15 0554  . dexamethasone (DECADRON) injection 4 mg  4 mg Intravenous Q12H Debbe Odea, MD   4 mg at 06/11/15 2205  . diphenhydrAMINE (BENADRYL) capsule  25 mg  25 mg Oral QHS Debbe Odea, MD   25 mg at 06/11/15 2304  . docusate sodium (COLACE) capsule 200 mg  200 mg Oral Daily Debbe Odea, MD   200 mg at 06/11/15 1142  . feeding supplement (BOOST / RESOURCE BREEZE) liquid 1 Container  1 Container Oral TID BM Gallina, DO   1 Container at 06/11/15 2051  . feeding supplement (ENSURE ENLIVE) (ENSURE ENLIVE) liquid 237 mL  237 mL Oral BID BM Nita Sells, MD   237 mL at 06/09/15 1402  . ondansetron (ZOFRAN) tablet 4 mg  4 mg Oral Q6H PRN Ivor Costa, MD       Or  . ondansetron [facility]) injection 4 mg  4 mg Intravenous Q6H PRN Ivor Costa, MD      . oxyCODONE-acetaminophen (PERCOCET/ROXICET) 5-325 MG per tablet 1-2 tablet  1-2  tablet Oral Q4H PRN Erby Pian, NP   2 tablet at 06/12/15 0009  . simvastatin (ZOCOR) tablet 10 mg  10 mg Oral q1800 Debbe Odea, MD   10 mg at 06/11/15 1831  . sodium chloride flush (NS) 0.9 % injection 3 mL  3 mL Intravenous Q12H Ivor Costa, MD   3 mL at 06/11/15 2305  . vancomycin (VANCOCIN) IVPB 750 mg/150 ml premix  750 mg Intravenous Q12H Eudelia Bunch, RPH   750 mg at 06/12/15 0104     Discharge Medications: Please see discharge summary for a list of discharge medications.  Relevant Imaging Results:  Relevant Lab Results:   Additional Information SSN: [id number]. Pt undergoing radiation treatment. Plan is for 10 fractions of radiation. Pt being simulated for radiation treatment today 06/12/15.   Raguel Kosloski A, LCSW

## 2015-06-12 NOTE — Progress Notes (Signed)
CSW continuing to follow.   CSW followed up with pt at bedside regarding SNF bed offers. Pt chooses bed at [facility]. CSW contacted [employer] and notified facility of acceptance of bed offer. CSW notified facility that RN did confirm that pt will discharge with wound vac and discussed that CSW will provide [employer] with radiation treatment schedule in order for [facility] to arrange transportation to radiation. [employer] plans to start insurance authorization through [facility] as authorization needed for admission to SNF.   CSW updated pt son via telephone and plan to meet with pt son tomorrow at hospital.   CSW to continue to follow.  Alison Murray, MSW, [place] Work [phone]

## 2015-06-12 NOTE — Progress Notes (Signed)
Events of last few days and noted. Patient appears to be clinically stable at this time and improved after transfusion.  She was transferred [facility] to start radiation therapy.  Upon completing radiation therapy, outpatient oncology follow-up will be arranged and she will be seen by Dr. Sonny Dandy for further discussion regarding her ongoing breast cancer care.

## 2015-06-13 ENCOUNTER — Ambulatory Visit
Admit: 2015-06-13 | Discharge: 2015-06-13 | Disposition: A | Discharge status: Home / Self Care | Payer: Medicare HMO | Attending: Radiation Oncology | Admitting: Radiation Oncology

## 2015-06-13 DIAGNOSIS — I639 Cerebral infarction, unspecified: Secondary | ICD-10-CM

## 2015-06-13 LAB — CBC
HEMATOCRIT: 32.1 % — AB (ref 36.0–46.0)
HEMOGLOBIN: 10.1 g/dL — AB (ref 12.0–15.0)
MCH: 26.4 pg (ref 26.0–34.0)
MCHC: 31.5 g/dL (ref 30.0–36.0)
MCV: 84 fL (ref 78.0–100.0)
PLATELETS: 624 10*3/uL — AB (ref 150–400)
RBC: 3.82 MIL/uL — AB (ref 3.87–5.11)
RDW: 17.2 % — ABNORMAL HIGH (ref 11.5–15.5)
WBC: 19.4 10*3/uL — AB (ref 4.0–10.5)

## 2015-06-13 LAB — BASIC METABOLIC PANEL
ANION GAP: 12 (ref 5–15)
BUN: 18 mg/dL (ref 6–20)
CHLORIDE: 100 mmol/L — AB (ref 101–111)
CO2: 27 mmol/L (ref 22–32)
CREATININE: 0.87 mg/dL (ref 0.44–1.00)
Calcium: 8.9 mg/dL (ref 8.9–10.3)
GFR calc non Af Amer: >60 mL/min (ref >60)
Glucose, Bld: 171 mg/dL — ABNORMAL HIGH (ref 65–99)
POTASSIUM: 4 mmol/L (ref 3.5–5.1)
SODIUM: 139 mmol/L (ref 135–145)

## 2015-06-13 LAB — GLUCOSE, CAPILLARY: Glucose-Capillary: 107 mg/dL — ABNORMAL HIGH (ref 65–99)

## 2015-06-13 NOTE — Progress Notes (Signed)
CSW continuing to follow.   Per MD, plastic surgeon, Dr Marla Roe plans to change wound VAC and check wound tomorrow and plan will be to discharge patient to [facility] once completed and once pt radiation completed tomorrow.   CSW updated [employer] and facility is continuing to work with [employer] re: insurance authorization.   CSW to assist with pt discharge to [facility] when pt medically ready and insurance authorization received.  Alison Murray, MSW, [place] Work [phone]

## 2015-06-13 NOTE — Progress Notes (Signed)
Occupational Therapy Treatment Patient Details Name: Robin Cantrell MRN: [id number] DOB: 11-02-1945 Today's Date: 06/13/2015    History of present illness patient is a 70 yo female who presents with Infection of left breast, possible left breast cancer and vegetation in ascending aorta.  Pt s/p L radical mastectomy and wound vac placement on 06/02/15 and is thought to have mets (liver and adrenal masses), possibly to the brain.  Oncology following.     OT comments  Patient progressing towards OT goals. May d/c to SNF for rehab today. OT will continue to follow.  Follow Up Recommendations  SNF;Supervision/Assistance - 24 hour    Equipment Recommendations  Other (comment) (tbd)    Recommendations for Other Services      Precautions / Restrictions Precautions Precaution Comments: wound vac       Mobility Bed Mobility Overal bed mobility: Needs Assistance Bed Mobility: Supine to Sit     Supine to sit: Supervision Sit to supine: Supervision      Transfers Overall transfer level: Needs assistance Equipment used: None Transfers: Sit to/from [employer] Transfers Sit to Stand: Supervision Stand pivot transfers: Supervision       General transfer comment: cues for safety with lines.    Balance                                   ADL Overall ADL's : Needs assistance/impaired Eating/Feeding: Set up;Sitting   Grooming: Wash/dry hands;Supervision/safety;Standing Grooming Details (indicate cue type and reason): at sink                 Toilet Transfer: Supervision/safety;BSC   Toileting- Water quality scientist and Hygiene: Supervision/safety;Sit to/from stand       Functional mobility during ADLs: Supervision/safety General ADL Comments: Patient finishing up lunch on arrival, in bed. Reports she may be going to SNF rehab today. Practiced toileting/grooming task (see above for details). Patient back to bed at end of session with bed alarm in  place.      Vision                     Perception     Praxis      Cognition   Behavior During Therapy: Impulsive Overall Cognitive Status: No family/caregiver present to determine baseline cognitive functioning Area of Impairment: Memory;Following commands;Safety/judgement;Problem solving;Awareness   Current Attention Level: Sustained Memory: Decreased short-term memory    Safety/Judgement: Decreased awareness of safety;Decreased awareness of deficits Awareness: Emergent Problem Solving: Difficulty sequencing;Requires verbal cues;Requires tactile cues;Slow processing      Extremity/Trunk Assessment               Exercises General Exercises - Upper Extremity Digit Composite Flexion: AROM;Left;10 reps;Seated Composite Extension: AROM;Left;10 reps;Seated   Shoulder Instructions       General Comments      Pertinent Vitals/ Pain       Pain Assessment: No/denies pain  Home Living                                          Prior Functioning/Environment              Frequency Min 2X/week     Progress Toward Goals  OT Goals(current goals can now be found in the care plan section)  Progress towards OT goals: Progressing toward goals  Plan Discharge plan remains appropriate    Co-evaluation                 End of Session     Activity Tolerance Patient tolerated treatment well   Patient Left in bed;with bed alarm set   Nurse Communication          Time: [phone] OT Time Calculation (min): 21 min  Charges: OT General Charges $OT Visit: 1 Procedure OT Treatments $Self Care/Home Management : 8-22 mins  Robin Cantrell A 06/13/2015, 1:27 PM

## 2015-06-13 NOTE — Care Management Important Message (Signed)
Important Message  Patient Details  Name: Ravenna Chiba MRN: [id number] Date of Birth: May 30, 1945   Medicare Important Message Given:  Yes    Camillo Flaming 06/13/2015, 11:36 AMImportant Message  Patient Details  Name: Yurika Hollon MRN: [id number] Date of Birth: 1945/09/30   Medicare Important Message Given:  Yes    Camillo Flaming 06/13/2015, 11:32 AM

## 2015-06-13 NOTE — Care Management Note (Signed)
Case Management Note  Patient Details  Name: Robin Cantrell MRN: [id number] Date of Birth: 21-Aug-1945   Expected Discharge Date:                  Expected Discharge Plan:  [place]  In-House Referral:  Clinical Social Work  Discharge planning Services  CM Consult  Post Acute Care Choice:    Choice offered to:     DME Arranged:    DME Agency:     HH Arranged:    [place] Agency:     Status of Service:     Medicare Important Message Given:  Yes Date Medicare IM Given:    Medicare IM give by:    Date Additional Medicare IM Given:    Additional Medicare Important Message give by:     If discussed at [place] of Stay Meetings, dates discussed:    Additional Comments:  Lynnell Catalan, RN 06/13/2015, 3:22 PM

## 2015-06-13 NOTE — Progress Notes (Signed)
[facility] PROGRESS NOTE  Latina Vera [id number] DOB: 1946/04/06 DOA: 06/02/2015 PCP: No primary care provider on file.  Brief narrative 70 year old African-American female with no past medical history, currently homeless living in her truck who was admitted on 06/01/2015 with fungating left breast lesion with purulent drainage. CT of the chest showed necrotic breast mass with axillary lymph nodes with liver and adrenal masses suspicious of malignancy. Patient reported having fevers with chills for almost 20 pound weight loss over past few months. [facility] consulted and patient underwent salvage mastectomy of the left breast on 06/02/2015 with wound VAC placed. 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 MRI brain showing numerous bilateral cerebral metastases with mild edema. She was transferred from [place] to [facility] on 2/11 to be assessed by radiation oncology. She complained of "blurred vision "on 2/11. It was noted that she had left-sided hemianopsia and therefore head CT was ordered which revealed an acute ischemic infarct.  HPI/Subjective: He states that her appetite is improving No new complaints. Specifically, no cough, dyspnea, nausea, vomiting, abdominal pain.   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 - Patient underwent incision and debridement of the left breast wound on 2/9 with A CELL  placement and VAC. - Radiation onc recommended that she be transferred to [facility] to start whole brain radiation - Today is her first day of radiation-  will need to be brought back to skilled nursing facility to the radiation department for further radiation treatments - Plastic surgery following- Dr Marla Roe plans on changing wound VAC and checking the wound tomorrow  after which the patient can be discharged to skilled nursing facility - Dr Alen Blew has noted in [facility] that she will have f/u arrange with Dr Lindi Adie for further discussion regarding chemo  Vision loss - loss of vision in left field of vision- left hemianopsia -small 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 Repeat cultures negative. - TEE shows 2 cm mobile pedunculated mass in the ascending aorta. Unclear if this is infectious vegetation versus associated with malignancy. -Was initially started on vancomycin and Unasyn- ID recommend to plan on treatment for 14 days  - HIV and hep C antibody negative -ID recommended Recommended to discontinue vancomycin which has been done- stop date of Unasyn 2/16- per ID, can switch to orals when ready for discharge  Symptomatic microcytic anemia - Progressive drop in hemoglobin (9.3 on admission--> 5.9).   - Transfused 2 units PRBC on 2/9 and improved..  - Iron panel suggest iron deficiency. Received IV iron on 2/4.  Severe protein calorie malnutrition Continue supplements.  Hypokalemia Replenished  DVT prophylaxis: SCDs Diet: Regular   Code Status: Full code Family Communication: none at bedside.  Disposition Plan: Can go to skilled nursing facility tomorrow after wound VAC is changed   Consultants:  plastic Surgery ( Dr Migdalia Dk)  Oncology ( Dr Alen Blew)  ID ( Dr Graylon Good)   radaition onc ( Dr Tammi Klippel)  Procedures:  Salvage mastectomy 2/3  TEE  Irrigation and debridement  of left breast wound with A CELL placement and VAC  Antibiotics:  IV vancomycin 2/3-2/11   Unasyn  2/3   Objective: Filed Vitals:   06/13/15 0440 06/13/15 1427  BP: 144/94 151/100  Pulse: 79 69  Temp: 98.3 F (36.8 C) 98.3 F (36.8 C)  Resp: 16     Intake/Output Summary (Last 24 hours) at 06/13/15 1625 Last data filed at 06/13/15 1230  Gross per 24 hour  Intake   1867 ml  Output   3205 ml  Net  -1338 ml   Filed Weights   06/02/15 0445 06/09/15 1834  Weight: 63.4 kg (139 lb 12.4 oz) 64.955 kg (143 lb 3.2 oz)    Exam:   General:  Elderly female in no acute distress  HEENT: no scleral icterus, moist mucosa  Chest: Dressing over left breast flap with wound VAC in place  CVS: Normal S1 and S2, no murmurs rubs or gallop  GI: Soft, nondistended, nontender, bowel sounds present  Musculoskeletal: Warm, no edema    Data Reviewed: Basic Metabolic Panel:  Recent Labs Lab 06/07/15 0534 06/09/15 0430 06/12/15 0442 06/13/15 0924  NA 140 138 136 139  K 3.4* 3.8 4.6 4.0  CL 104 100* 102 100*  CO2 29 29 25 27   GLUCOSE 90 102* 167* 171*  BUN <5* <5* 15 18  CREATININE 0.81 0.87 0.84 0.87  CALCIUM 7.8* 7.9* 8.5* 8.9   Liver Function Tests:  Recent Labs Lab 06/07/15 0534  AST 22  ALT 18  ALKPHOS 63  BILITOT 0.7  PROT 5.1*  ALBUMIN 1.8*   No results for input(s): LIPASE, AMYLASE in the last 168 hours. No results for input(s): AMMONIA in the last 168 hours. CBC:  Recent Labs Lab 06/07/15 0534 06/09/15 0430 06/12/15 0442 06/13/15 0924  WBC 11.7* 11.4* 18.2* 19.4*  NEUTROABS 8.2*  --   --   --   HGB 5.9* 9.0* 8.5* 10.1*  HCT 18.3* 27.8* 27.0* 32.1*  MCV 75.6* 80.1 82.6 84.0  PLT 441* 444* 536* 624*   Cardiac Enzymes: No results for input(s): CKTOTAL, CKMB, CKMBINDEX, TROPONINI in the last 168 hours. BNP (last 3 results) No results for input(s): BNP in the last 8760 hours.  ProBNP (last 3 results) No results for input(s): PROBNP in the last 8760 hours.  CBG:  Recent Labs Lab 06/07/15 0857 06/08/15 0721 06/09/15 0731 06/12/15 0739  06/13/15 0736  GLUCAP 126* 98 87 149* 107*    No results found for this or any previous visit (from the past 240 hour(s)).   Studies: No results found.  Scheduled Meds: . sodium chloride   Intravenous Once  . ampicillin-sulbactam (UNASYN) IV  3 g Intravenous Q6H  . dexamethasone  4 mg Intravenous Q12H  . diphenhydrAMINE  25 mg Oral QHS  . docusate sodium  200 mg Oral Daily  . feeding supplement  1 Container Oral TID BM  . feeding supplement (ENSURE ENLIVE)  237 mL Oral BID BM  . simvastatin  10 mg Oral q1800  . sodium chloride flush  3 mL Intravenous Q12H   Continuous Infusions:     Time spent: 25 minutes    [place]  [facility] Pager: [email or web address] If 7PM-7AM, please contact night-coverage at [email or web address], password [facility] 06/13/2015, 4:25 PM  LOS: 11 days

## 2015-06-14 ENCOUNTER — Ambulatory Visit
Admit: 2015-06-14 | Discharge: 2015-06-14 | Disposition: A | Discharge status: Home / Self Care | Payer: Medicare HMO | Attending: Radiation Oncology | Admitting: Radiation Oncology

## 2015-06-14 DIAGNOSIS — Z510 Encounter for antineoplastic radiation therapy: Secondary | ICD-10-CM | POA: Diagnosis not present

## 2015-06-14 DIAGNOSIS — C7931 Secondary malignant neoplasm of brain: Secondary | ICD-10-CM | POA: Insufficient documentation

## 2015-06-14 MED ORDER — DOCUSATE SODIUM 100 MG PO CAPS: 200.0000 mg | ORAL_CAPSULE | Freq: Every day | ORAL | Status: DC

## 2015-06-14 MED ORDER — DIPHENHYDRAMINE HCL 25 MG PO CAPS: 25.0000 mg | ORAL_CAPSULE | Freq: Every evening | ORAL | Status: DC | PRN

## 2015-06-14 MED ORDER — OXYCODONE HCL 5 MG PO TABS: 5.0000 mg | ORAL_TABLET | ORAL | Status: DC | PRN

## 2015-06-14 MED ORDER — ONDANSETRON HCL 4 MG PO TABS: 4.0000 mg | ORAL_TABLET | Freq: Four times a day (QID) | ORAL | Status: DC | PRN

## 2015-06-14 MED ORDER — ENSURE ENLIVE PO LIQD: 237.0000 mL | Freq: Two times a day (BID) | ORAL | Status: AC

## 2015-06-14 MED ORDER — LIP MEDEX EX OINT
TOPICAL_OINTMENT | CUTANEOUS | Status: AC
Start: 2015-06-14 — End: 2015-06-14
  Administered 2015-06-14: 16:00:00
  Filled 2015-06-14: qty 7

## 2015-06-14 MED ORDER — ACETAMINOPHEN 325 MG PO TABS: 650.0000 mg | ORAL_TABLET | Freq: Four times a day (QID) | ORAL | Status: DC | PRN

## 2015-06-14 MED ORDER — BOOST / RESOURCE BREEZE PO LIQD: 1.0000 | Freq: Three times a day (TID) | ORAL | Status: DC

## 2015-06-14 MED ORDER — AMOXICILLIN-POT CLAVULANATE 875-125 MG PO TABS: 1.0000 | ORAL_TABLET | Freq: Two times a day (BID) | ORAL | Status: AC

## 2015-06-14 MED ORDER — SIMVASTATIN 10 MG PO TABS: 10.0000 mg | ORAL_TABLET | Freq: Every day | ORAL | Status: DC

## 2015-06-14 MED ORDER — DEXAMETHASONE 2 MG PO TABS: 2.0000 mg | ORAL_TABLET | Freq: Two times a day (BID) | ORAL | Status: DC

## 2015-06-14 NOTE — Progress Notes (Signed)
The patient received her first of 10 fractions of radiotherapy on 06/13/15. We will coordinate with her SNF once she discharges for her daily treatments.  Shona Simpson, [facility]

## 2015-06-14 NOTE — Progress Notes (Signed)
6 Days Post-Op  Subjective: Patient doing well following Acell placement and VAC dressing was changed today. Acell appears to be incorporating.  Will plan for split thickness skin graft in a couple of weeks.  Will need VAC dressing changes every Monday and Thursday at the nursing facility.  The adaptic should be left in place over the Acell at the time of the dressing changes or replaced if the adaptic is removed when the dressing is changed.   Objective: Vital signs in last 24 hours: Temp:  [98.3 F (36.8 C)-98.5 F (36.9 C)] 98.4 F (36.9 C) (02/15 0529) Pulse Rate:  [60-85] 85 (02/15 0529) Resp:  [16-17] 16 (02/15 0529) BP: (151-175)/(82-100) 167/82 mmHg (02/15 0529) SpO2:  [99 %-100 %] 100 % (02/15 0529) Last BM Date: 06/14/15  Intake/Output from previous day: 02/14 0701 - 02/15 0700 In: 1884 [P.O.:1484; IV Piggyback:400] Out: [id number] [Urine:1650] Intake/Output this shift:    General appearance: alert, cooperative, appears stated age and no distress Left breast area- VAC dressing was removed and the Acell appears to be incorporating. The VAC dressing was replaced.   Lab Results:   Recent Labs  06/12/15 0442 06/13/15 0924  WBC 18.2* 19.4*  HGB 8.5* 10.1*  HCT 27.0* 32.1*  PLT 536* 624*   BMET  Recent Labs  06/12/15 0442 06/13/15 0924  NA 136 139  K 4.6 4.0  CL 102 100*  CO2 25 27  GLUCOSE 167* 171*  BUN 15 18  CREATININE 0.84 0.87  CALCIUM 8.5* 8.9   PT/INR No results for input(s): LABPROT, INR in the last 72 hours. ABG No results for input(s): PHART, HCO3 in the last 72 hours.  Invalid input(s): PCO2, PO2  Studies/Results: No results found.  Anti-infectives: Anti-infectives    Start     Dose/Rate Route Frequency Ordered Stop   06/08/15 1212  polymyxin B 500,000 Units, bacitracin 50,000 Units in sodium chloride irrigation 0.9 % 500 mL irrigation  Status:  Discontinued       As needed 06/08/15 1213 06/08/15 1320   06/05/15 1400  vancomycin  (VANCOCIN) IVPB 750 mg/150 ml premix  Status:  Discontinued     750 mg 150 mL/hr over 60 Minutes Intravenous Every 12 hours 06/05/15 1258 06/12/15 1039   06/03/15 0000  vancomycin (VANCOCIN) IVPB 1000 mg/200 mL premix  Status:  Discontinued     1,000 mg 200 mL/hr over 60 Minutes Intravenous Every 24 hours 06/02/15 2350 06/05/15 1258   06/02/15 2000  linezolid (ZYVOX) tablet 600 mg  Status:  Discontinued     600 mg Oral Every 12 hours 06/02/15 0454 06/02/15 2310   06/02/15 0600  linezolid (ZYVOX) IVPB 600 mg     600 mg 300 mL/hr over 60 Minutes Intravenous  Once 06/02/15 0454 06/02/15 0958   06/02/15 0600  Ampicillin-Sulbactam (UNASYN) 3 g in sodium chloride 0.9 % 100 mL IVPB    Comments:  Unasyn 3 g IV q6h for CrCl > 30 mL/min   3 g 100 mL/hr over 60 Minutes Intravenous Every 6 hours 06/02/15 0458     06/02/15 0100  vancomycin (VANCOCIN) 1,500 mg in sodium chloride 0.9 % 500 mL IVPB     1,500 mg 250 mL/hr over 120 Minutes Intravenous  Once 06/02/15 0055 06/02/15 0502   06/02/15 0100  piperacillin-tazobactam (ZOSYN) IVPB 3.375 g     3.375 g 100 mL/hr over 30 Minutes Intravenous  Once 06/02/15 0055 06/02/15 0202      Assessment/Plan: s/p Procedure(s): IRRIGATION AND DEBRIDEMENT LEFT  BREAST WOUND WITH A CELL PLACEMENT AND VAC (Left) WITH A CELL PLACEMENT (Left) AND VAC (Left) Patient will need VAC dressing change Monday and Thursdays at the nursing facility as noted above and we are looking at scheduling for a split thickness skin graft in a couple of weeks.   LOS: 12 days    Elexa Kivi,PA-C Plastic Surgery [phone]

## 2015-06-14 NOTE — Discharge Instructions (Signed)
Please make sure that the patient has twice weekly VAC dressing changes Monday and Thursdays, applying the VAC sponge over the adaptic (meshed dressing) or replace the adaptic if it gets removed.   We are scheduling for a skin graft to the left breast in the next couple of weeks and Dr. Eusebio Friendly office will contact the facility to coordinate schedule for OR.

## 2015-06-14 NOTE — Clinical Social Work Placement (Signed)
   CLINICAL SOCIAL WORK PLACEMENT  NOTE  Date:  06/14/2015  Patient Details  Name: Robin Cantrell MRN: [id number] Date of Birth: [birth year]  Clinical Social Work is seeking post-discharge placement for this patient at the [place] level of care (*CSW will initial, date and re-position this form in  chart as items are completed):  Yes   Patient/family provided with [facility] Work Department's list of facilities offering this level of care within the geographic area requested by the patient (or if unable, by the patient's family).  Yes   Patient/family informed of their freedom to choose among providers that offer the needed level of care, that participate in Medicare, Medicaid or managed care program needed by the patient, have an available bed and are willing to accept the patient.  Yes   Patient/family informed of [facility]'s ownership interest in [facility] and [facility], as well as of the fact that they are under no obligation to receive care at these facilities.  PASRR submitted to EDS on 06/12/15     PASRR number received on 06/12/15     Existing PASRR number confirmed on       FL2 transmitted to all facilities in geographic area requested by pt/family on 06/12/15     FL2 transmitted to all facilities within larger geographic area on       Patient informed that his/her managed care company has contracts with or will negotiate with certain facilities, including the following:        Yes   Patient/family informed of bed offers received.  Patient chooses bed at [facility]     Physician recommends and patient chooses bed at      Patient to be transferred to [facility] on 06/14/15.  Patient to be transferred to facility by ambulance [facility])     Patient family notified on 06/14/15 of transfer.  Name of family member notified:  pt notified at bedside and pt son, [name] notified via telephone      PHYSICIAN Please prepare priority discharge summary, including medications, Please prepare prescriptions, Please sign FL2     Additional Comment:    _______________________________________________ Ladell Pier, LCSW 06/14/2015, 3:24 PM

## 2015-06-14 NOTE — Progress Notes (Signed)
Pt for discharge to [facility].   CSW facilitated pt discharge needs including contacting facility, faxing pt discharge information via [place] received [facility] authorization, discussing with pt at bedside and pt son, Eddie via telephone, providing RN phone number to call report, and arranging ambulance transport for pt to [facility].   CSW provided calendar of pt radiation treatments in packet for SNF and SNF confirmed facility can provide transportation to radiation treatment. [employer] can also manage wound vac.  No further social work needs identified at this time.  CSW signing off.  Alison Murray, MSW, [place] Work [phone]

## 2015-06-14 NOTE — Discharge Summary (Addendum)
Robin Cantrell, is a 70 y.o. female  DOB 02/04/1946  MRN [id number].  Admission date:  06/02/2015  Admitting Physician  Ivor Costa, MD  Discharge Date:  06/14/2015   Primary MD  No primary care provider on file.  Recommendations for primary care physician for things to follow:  - to be seen by skilled nursing facility physician in 3 days. - Please check CBC, CMP in 3 days. - To follow with plastic surgery, they will call with an appointment - Continue with radiation radiation treatment as scheduled by radiation oncology office.    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 No pertinent past medical history.  Past Surgical History  Procedure Laterality Date  . Ankle fracture surgery    . Total mastectomy Left 06/02/2015    Procedure: LEFT TOTAL MASTECTOMY;  Surgeon: Donnie Mesa, MD;  Location: [place];  Service: General;  Laterality: Left;  . Tee without cardioversion N/A 06/06/2015    Procedure: TRANSESOPHAGEAL ECHOCARDIOGRAM (TEE);  Surgeon: Skeet Latch, MD;  Location: [place];  Service: Cardiovascular;  Laterality: N/A;  . Incision and drainage of wound Left 06/08/2015    Procedure: IRRIGATION AND DEBRIDEMENT LEFT BREAST WOUND WITH A CELL  PLACEMENT AND VAC;  Surgeon: Loel Lofty Dillingham, DO;  Location: [place];  Service: Plastics;  Laterality: Left;  . Application of a-cell of chest/abdomen Left 06/08/2015    Procedure: WITH A CELL PLACEMENT;  Surgeon: Loel Lofty Dillingham, DO;  Location: [place];  Service: Plastics;  Laterality: Left;  . Application of wound vac Left 06/08/2015    Procedure: AND VAC;  Surgeon: Loel Lofty Dillingham, DO;  Location: [place];  Service: Plastics;  Laterality: Left;       History of present illness and  Hospital Course:     Kindly see H&P for history of present illness and admission details, please review complete Labs, Consult reports and Test reports for all details in brief  HPI  from the history and physical done on the day of admission 06/02/2015 69 y.o. female without significant past medical history except for homeless, who presents with left breast pain.  Patient reports that she noticed left breast lesion for more than 5 month, did not seek for treatment. She states that she may have had a spider bite to left upper chest with timeframe unknown. She has pain over left breast  with purulent drainage. She has extensive necrotic lesion over left breast. Patient does not have fever, chills, nausea, vomiting, abdominal pain or symptoms of UTI. She has mild shortness of breath since January, but no chest pain or coughing. No unilateral weakness.   In ED, patient was found to have WBC 17.9, temperature normal, RR 23, no tachycardia, acute renal injury with creatinine 1.41, BUN 29, lactate 1.43, alcohol level less than 5. CT-chest with contrast showed necrotic left breast mass consistent with malignancy, small but asymmetric left axillary lymph nodes, 3 cm vegetation attached to the ascending aortic wall, indeterminate liver and right adrenal masses. Patient is admitted to inpatient for further eval and treatment. ID, Dr. Tommy Medal was consulted.   Hospital Course  70 year old African-American female with no past  medical history, currently homeless living in her truck who was admitted on 06/01/2015 with fungating left breast lesion with purulent drainage. CT of the chest showed necrotic breast mass with axillary lymph nodes with liver and adrenal masses suspicious of malignancy. Patient reported having fevers with chills for almost 20 pound weight loss over past few months. [place] [facility] consulted and patient underwent salvage mastectomy of the left breast on 06/02/2015 with wound VAC placed. 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 MRI brain showing numerous bilateral cerebral metastases with mild edema. She was transferred from [place] to [facility] on 2/11 to be assessed by radiation oncology. She complained of "blurred vision "on 2/11. It was noted that she had left-sided hemianopsia and therefore head CT was ordered which revealed an acute ischemic infarct.  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 - Patient underwent incision and debridement of the left breast wound on 2/9 with A CELL placement and VAC. - Radiation onc recommended that she be transferred to [facility] to start whole brain radiation - Started on radiation therapy by radiation oncology, to continue as an outpatient, facility will arrange for transport, patient was on by mouth Decadron, this is to be tapered as per radiation oncology. - Plastic surgery following- Dr Marla Roe plans for split thickness skin graft in a couple weeks, they will call with appointment, patient to continue with [facility] dressing change every Monday and Thursday at facility. - Dr Alen Blew has noted in [facility] that she will have f/u arrange with Dr Lindi Adie for further discussion regarding chemo  Vision loss/acute CVA - loss of vision in left field of vision- left hemianopsia -small acute  ischemic infarct in right occipital lobe corresponding with vision loss-- hold off on aspirin to prevent hemorrhage into brain mets in the setting of brain metastasis with radiation treatment.- Dr. Wynelle Cleveland discussed finding with patient and son.   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 Repeat cultures negative. - TEE shows 2 cm mobile pedunculated mass in the ascending aorta. Unclear if this is infectious vegetation versus associated with malignancy. -Was initially started on vancomycin and Unasyn- ID recommend to plan on treatment for 14 days  - HIV and hep C antibody negative -ID recommended Recommended to discontinue vancomycin which has been done- stop date of Unasyn 2/16- per ID, can switch to orals when ready for discharge, we'll discharge on oral Augmentin, last day  on 06/15/2015  Symptomatic microcytic anemia - Progressive drop in hemoglobin (9.3 on admission--> 5.9).  - Transfused 2 units PRBC on 2/9 and improved..  - Iron panel suggest iron deficiency. Received IV iron on 2/4.  Severe protein calorie malnutrition Continue supplements.  Hypokalemia Replenished   Discharge Condition:  Stable   Follow UP  Follow-up Information    Follow up with Wallace Going, DO.   Specialty:  Plastic Surgery   Contact information:   [place] [address] [phone]       Follow up with Rulon Eisenmenger, MD.   Specialty:  Hematology and Oncology   Contact information:   [place] [address] [phone]         Discharge Instructions  and  Discharge Medications     Discharge Instructions    Discharge instructions    Complete by:  As directed   Follow with skilled nursing facility physician in 3 days.   Activity: As tolerated with Full fall  precautions use walker/cane & assistance as needed   Disposition SNF   Diet: Heart Healthy  , with feeding assistance and aspiration precautions.  For Heart failure patients - Check your Weight same time everyday, if you gain over 2 pounds, or you develop in leg swelling, experience more shortness of breath or chest pain, call your Primary MD immediately. Follow Cardiac Low Salt Diet and 1.5 lit/day fluid restriction.   On your next visit with your primary care physician please Get Medicines reviewed and adjusted.   Please request your Prim.MD to go over all Hospital Tests and Procedure/Radiological results at the follow up, please get all Hospital records sent to your Prim MD by signing hospital release before you go home.   If you experience worsening of your admission symptoms, develop shortness of breath, life threatening emergency, suicidal or homicidal thoughts you must seek medical attention immediately by calling 911 or calling your MD immediately  if symptoms less severe.  You Must read complete instructions/literature along with all the possible adverse reactions/side effects for all the Medicines you take and that have been prescribed to you. Take any new Medicines after you have completely understood and accpet all the possible adverse reactions/side effects.   Do not drive, operating heavy machinery, perform activities at heights, swimming or participation in water activities or provide baby sitting services if your were admitted for syncope or siezures until you have seen by Primary MD or a Neurologist and advised to do so again.  Do not drive when taking Pain medications.    Do not take more than prescribed Pain, Sleep and Anxiety Medications  Special Instructions: If you have smoked or chewed Tobacco  in the last 2 yrs please stop smoking, stop any regular Alcohol  and or any Recreational drug use.  Wear Seat belts while driving.   Please note  You were cared for by  a hospitalist during your hospital stay. If you have any questions about your discharge medications or the care you received while you were in the hospital after you are discharged, you can call the unit and asked to speak with the hospitalist on call if the hospitalist that took care of you is not available. Once you are discharged, your primary care physician will handle any further medical issues. Please note that NO REFILLS for any discharge medications will be authorized once you are discharged, as it is imperative that you return to your primary care physician (  or establish a relationship with a primary care physician if you do not have one) for your aftercare needs so that they can reassess your need for medications and monitor your lab values.     Discharge wound care:    Complete by:  As directed   Please make sure that the patient has twice weekly VAC dressing changes Monday and Thursdays, applying the VAC sponge over the adaptic (meshed dressing) or replace the adaptic if it gets removed.   We are scheduling for a skin graft to the left breast in the next couple of weeks and Dr. Eusebio Friendly office will contact the facility to coordinate schedule for OR.     Increase activity slowly    Complete by:  As directed             Medication List    TAKE these medications        acetaminophen 325 MG tablet  Commonly known as:  TYLENOL  Take 2 tablets (650 mg total) by mouth every 6 (six) hours as needed for mild pain (or Fever >/= 101).     amoxicillin-clavulanate 875-125 MG tablet  Commonly known as:  AUGMENTIN  Take 1 tablet by mouth 2 (two) times daily. Please take for 2 days then stop     dexamethasone 2 MG tablet  Commonly known as:  DECADRON  Take 1 tablet (2 mg total) by mouth 2 (two) times daily.     diphenhydrAMINE 25 mg capsule  Commonly known as:  BENADRYL  Take 1 capsule (25 mg total) by mouth at bedtime as needed for sleep.     docusate sodium 100 MG capsule    Commonly known as:  COLACE  Take 2 capsules (200 mg total) by mouth daily.     feeding supplement Liqd  Take 1 Container by mouth 3 (three) times daily between meals. Or alternative formulary at facility     feeding supplement (ENSURE ENLIVE) Liqd  Take 237 mLs by mouth 2 (two) times daily between meals. Or alternative formulary at facility     ondansetron 4 MG tablet  Commonly known as:  ZOFRAN  Take 1 tablet (4 mg total) by mouth every 6 (six) hours as needed for nausea.     oxyCODONE 5 MG immediate release tablet  Commonly known as:  ROXICODONE  Take 1 tablet (5 mg total) by mouth every 4 (four) hours as needed for severe pain.     simvastatin 10 MG tablet  Commonly known as:  ZOCOR  Take 1 tablet (10 mg total) by mouth daily at 6 PM.          Diet and Activity recommendation: See Discharge Instructions above   Consults obtained -   plastic Surgery ( Dr Migdalia Dk)  Oncology ( Dr Alen Blew)  ID ( Dr Graylon Good)  radaition onc ( Dr Tammi Klippel)  Major procedures and Radiology Reports - PLEASE review detailed and final reports for all details, in brief -    Salvage mastectomy 2/3  TEE  Irrigation and debridement of left breast wound with A CELL placement and VAC  Ct Head Wo Contrast  06/10/2015  CLINICAL DATA:  Sudden onset of left visual field loss today. Frontal headache and confusion. Personal history of meningioma. Breast cancer with multiple metastatic lesions of the brain. EXAM: CT HEAD WITHOUT CONTRAST TECHNIQUE: Contiguous axial images were obtained from the base of the skull through the vertex without intravenous contrast. COMPARISON:  MRI brain 06/07/2015 FINDINGS: A calcified meningioma adjacent to  the right frontal lobe is stable, measuring 2.9 x 1.4 cm. Extensive subcortical white matter disease represents a combination of chronic microvascular ischemia and vasogenic edema associated with multiple known brain metastases. A new area of confluent cortical  hypoattenuation is present in the right occipital lobe compatible with an acute nonhemorrhagic infarct. This is new since the study 3 days ago. The paranasal sinuses and mastoid air cells are clear. The calvarium is intact. No significant extracranial lesions are present. IMPRESSION: 1. Acute nonhemorrhagic infarct of the right occipital lobe corresponding with the patient's left hemi nap CF. 2. Extensive white matter hypoattenuation representing a combination of chronic microvascular ischemia and vasogenic edema associated with the patient's extensive metastatic disease to the brain. 3. Right-sided meningioma. Electronically Signed   By: San Morelle M.D.   On: 06/10/2015 16:40   Ct Head W Wo Contrast  06/03/2015  CLINICAL DATA:  70 year old female with newly diagnosed breast cancer status post mastectomy yesterday. Subsequent encounter. EXAM: CT HEAD WITHOUT AND WITH CONTRAST TECHNIQUE: Contiguous axial images were obtained from the base of the skull through the vertex without and with intravenous contrast CONTRAST:  48mL OMNIPAQUE IOHEXOL 350 MG/ML SOLN COMPARISON:  Chest CT 06/02/2015. FINDINGS: Mild right mastoid effusion. Negative visible nasopharynx. Other Visualized paranasal sinuses and mastoids are clear. No acute or suspicious osseous lesion identified in the visible skull. Visualized scalp soft tissues are within normal limits. Along the right lateral hemisphere there is a 3.3 cm length 1.4 cm with peripheral, dural based appearing hyperdense lesion with enhancement at the level of the operculum (series 4, image 15). Despite this lesions size, there is no definite associated edema. There is patchy and confluent bilateral cerebral white matter hypodensity which might in part be small vessel related, however, following contrast there are multiple subcentimeter round enhancing lesions (most about 7 mm diameter) identified in the right frontal lobe (series 6, images 13 and 14), left frontal lobe,  and posterior left temporal lobe. The largest of these lesions is a 14 mm left operculum metastasis on series 6, image 17. Associated vasogenic edema but no significant mass effect. In all 6 such lesions are identified. Despite these findings there is no intracranial mass effect. Basilar cisterns remain patent. No chronic cortically based infarct identified. No ventriculomegaly. No acute intracranial hemorrhage identified. Major intracranial vascular structures are enhancing. IMPRESSION: 1. Positive for metastatic disease to the brain. At least 6 round enhancing brain masses in both hemispheres, most are small - about 7 mm in size while the largest is 14 mm. Some vasogenic edema but no significant intracranial mass effect. 2. Superimposed moderate-sized 3 cm right operculum extra-axial appearing hyperdense an enhancing mass. Although dural based metastasis is possible this may instead reflect a superimposed benign meningioma. 3. Follow-up brain MRI without and with contrast may clarify the diagnosis of #2 and detect additional small brain metastases. Electronically Signed   By: Genevie Ann M.D.   On: 06/03/2015 13:41   Ct Chest W Contrast  06/02/2015  CLINICAL DATA:  Evaluate for left breast mass versus malignancy. EXAM: CT CHEST WITH CONTRAST TECHNIQUE: Multidetector CT imaging of the chest was performed during intravenous contrast administration. CONTRAST:  65mL OMNIPAQUE IOHEXOL 300 MG/ML  SOLN COMPARISON:  None. FINDINGS: THORACIC INLET/BODY WALL: The entire left breast is affected by a broad-based ulcerated mass with peripheral enhancement, heaped up margins, and nonenhancing debris in the ulcer base. Maximal diameter is approximately 13 cm. No drainable fluid collection. This is has the appearance of a  necrotic malignancy. There is likely involvement of the pectoralis muscles but no bony invasion noted. Left axillary nodes are within normal limits in size, but asymmetric and suspicious soley based on the size of  breast mass. No right breast lesion is seen. MEDIASTINUM: No cardiomegaly or pericardial effusion. Atherosclerosis, including the coronary arteries. There is a hypodense filling defect attached to the left wall of the ascending aorta which measures up to 3 cm. On reformats, this appears to have a narrow base along the wall which is not otherwise remarkable for thickening or calcification. Atherosclerosis is present along the aorta and great vessels, without dissection or other aerated notable mural thrombus. There is no indication of valvular disease/vegetation. No lymphadenopathy, including along the left IMA distribution LUNG WINDOWS: Mild centrilobular emphysema. There is no edema, consolidation, effusion, or pneumothorax. No suspicious nodule. UPPER ABDOMEN: There is a 10 mm low-density in the upper left liver which does not measured cystic density. Indeterminate 13 mm right adrenal mass. OSSEOUS: No acute fracture.  No suspicious lytic or blastic lesions. IMPRESSION: 1. 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 As questioned clinically, no abscess.Small but asymmetric left axillary lymph nodes. 2. 3 cm vegetation attached to the ascending aortic wall. This would be unusual for bland atherosclerotic thrombus, correlate for systemic infection/bacteremia. 3. Indeterminate liver and right adrenal masses. Recommend abdominal staging after workup of #1. Electronically Signed   By: Monte Fantasia M.D.   On: 06/02/2015 03:24   Mr Jeri Cos [id number] Contrast  06/07/2015  CLINICAL DATA:  Newly diagnosed left breast cancer status post mastectomy on 06/02/2015. Staging. Brain metastases on recent head CT. EXAM: MRI HEAD WITHOUT AND WITH CONTRAST TECHNIQUE: Multiplanar, multiecho pulse sequences of the brain and surrounding structures were obtained without and with intravenous contrast. CONTRAST:  62mL MULTIHANCE GADOBENATE DIMEGLUMINE 529 MG/ML IV SOLN COMPARISON:  06/03/2015 head CT FINDINGS: There is no evidence  of acute infarct, midline shift, or extra-axial fluid collection. Patchy to confluent T2 hyperintensities are present throughout the subcortical and deep cerebral white matter bilaterally and reflect in part vasogenic edema as well as likely age advanced chronic small vessel ischemia. There is mild cerebral atrophy which is within normal limits for age. Chronic microhemorrhages are scattered throughout both cerebral and cerebellar hemispheres as well as in the thalami and may be due to hypertension. A small amount of blood products are associated with some of the metastases, most notably the largest left frontal lesion. There are numerous enhancing lesions throughout both cerebral hemispheres which are primarily located at the gray-white junction and number greater than 30. The largest lesion on the left is located in the frontal lobe and measures 2.5 x 1.5 cm (series 11, image 36). The largest lesion on the right measures 1.5 x 1.0 cm in the right occipital lobe (series 11, image 23). A single cerebellar lesion measures 5 mm inferiorly on the left (series 11, image 8) without significant edema. As noted on CT, there is also an extra-axial lesion with broad dural interface overlying the right cerebral convexity at the level of the operculum. This demonstrates homogeneous enhancement and measures 3.2 x 1.4 cm (series 11, image 30) without evidence of underlying brain edema. No other enhancing extra-axial lesions are identified. Orbits are unremarkable. There is a small right mastoid effusion. Paranasal sinuses are clear. Major intracranial vascular flow voids are preserved. Calvarial bone marrow signal is diminished diffusely without a suspicious focal lesion identified, compatible with known underlying anemia. IMPRESSION: 1. Numerous bilateral cerebral metastases  as above. Mild edema associated with many of these lesions but no significant mass effect. 2. Single, small cerebellar metastasis without edema. 3. 3.2 cm  extra-axial lesion at the level of the operculum on the right. A meningioma is favored over solitary dural metastasis. 4. Age advanced chronic small vessel ischemic disease and chronic microhemorrhages. Electronically Signed   By: Logan Bores M.D.   On: 06/07/2015 14:25   Ct Abdomen Pelvis W Contrast  06/03/2015  CLINICAL DATA:  Patient status post LEFT mastectomy for metastatic breast cancer. Patient did lesion discovered on CT exam from 06/02/2015. EXAM: CT ANGIOGRAPHY CHEST CT ABDOMEN AND PELVIS WITH CONTRAST TECHNIQUE: Multidetector CT imaging of the chest was performed using the standard protocol during bolus administration of intravenous contrast. Multiplanar CT image reconstructions and MIPs were obtained to evaluate the vascular anatomy. Multidetector CT imaging of the abdomen and pelvis was performed using the standard protocol during bolus administration of intravenous contrast. CONTRAST:  80 mL Omnipaque COMPARISON:  CT 06/02/2015. FINDINGS: CTA CHEST FINDINGS Mediastinum/Nodes: Again demonstrated a irregular mass within the ascending aorta with a stalk like attachment to the aortic wall seen best on image 72, series 12 sagittal series. At its attachment the lesion puckers the wall of the aorta. The lesion is difficult to measure but measure approximately 1.4 cm (image 60, series 8). There is abnormal thickening of the adventitia of the aorta of throughout the aorta. No additional lesions are present within the aorta. There is uniform thickening of the adventitia throughout aorta. Great vessels are normal. No pulmonary embolism. No pericardial fluid. The LEFT and RIGHT ventricle appear normal. Normal atria Lungs/Pleura: No pulmonary nodularity suggest metastasis. Small bilateral pleural effusions. Musculoskeletal: A postsurgical change in the LEFT chest wall consistent with mastectomy. CT ABDOMEN and PELVIS FINDINGS Hepatobiliary: Small hypodense lesion in the superior aspect of the LEFT hepatic lobe  has low attenuation consistent with a cyst. No biliary duct dilatation. Gallbladder normal. Pancreas: Pancreas is normal. No ductal dilatation. No pancreatic inflammation. Spleen: Normal spleen Adrenals/urinary tract: There is bilateral nodular adrenal thickening. The lesion of concern on comparison CT is felt to represent a nodule enlargement of the medial limb of the RIGHT adrenal gland to 13 mm. . No renal obstruction. Ureters are normal. On the lateral projection there is suggestion hypodensity within the dome the bladder which is felt to represent streak artifact from adjacent contrast within the bowel. Stomach/Bowel: Stomach, small bowel, appendix are normal. There is diffuse vessel thickening in the ascending colon without pericolonic inflammation. No mass lesion or obstruction. The LEFT colon and rectosigmoid colon are normal. Vascular/Lymphatic: Abdominal aorta is normal caliber. There is no retroperitoneal or periportal lymphadenopathy. No pelvic lymphadenopathy. Reproductive: Uterus and ovaries are normal. Other: No free fluid. Musculoskeletal: No aggressive osseous lesion. Review of the MIP images confirms the above findings. IMPRESSION: Chest Impression: 1. Pedunculated lesion within the ascending aorta with differential including infectious versus neoplastic etiology. Transesophageal echocardiography may provide further characterization. 2. Uniform adventitial thickening involving the entirety the thoracic aorta. 3. No evidence of pulmonary metastasis. 4. Postsurgical changes in the LEFT chest wall Abdomen / Pelvis Impression: 1. Small hypodense lesion in the liver likely represents benign cyst. 2. Nodular enlargement of the RIGHT adrenal gland is indeterminate. Consider FDG PET scan for complete staging. 3. No metastatic disease identified elsewhere in the abdomen or pelvis. 4. Mucosal thickening in the ascending colon is nonspecific but suggests segmental colitis. Electronically Signed   By: Helane Gunther.D.  On: 06/03/2015 13:57   Ct Angio Chest Aorta W/cm &/or Wo/cm  06/03/2015  CLINICAL DATA:  Patient status post LEFT mastectomy for metastatic breast cancer. Patient did lesion discovered on CT exam from 06/02/2015. EXAM: CT ANGIOGRAPHY CHEST CT ABDOMEN AND PELVIS WITH CONTRAST TECHNIQUE: Multidetector CT imaging of the chest was performed using the standard protocol during bolus administration of intravenous contrast. Multiplanar CT image reconstructions and MIPs were obtained to evaluate the vascular anatomy. Multidetector CT imaging of the abdomen and pelvis was performed using the standard protocol during bolus administration of intravenous contrast. CONTRAST:  80 mL Omnipaque COMPARISON:  CT 06/02/2015. FINDINGS: CTA CHEST FINDINGS Mediastinum/Nodes: Again demonstrated a irregular mass within the ascending aorta with a stalk like attachment to the aortic wall seen best on image 72, series 12 sagittal series. At its attachment the lesion puckers the wall of the aorta. The lesion is difficult to measure but measure approximately 1.4 cm (image 60, series 8). There is abnormal thickening of the adventitia of the aorta of throughout the aorta. No additional lesions are present within the aorta. There is uniform thickening of the adventitia throughout aorta. Great vessels are normal. No pulmonary embolism. No pericardial fluid. The LEFT and RIGHT ventricle appear normal. Normal atria Lungs/Pleura: No pulmonary nodularity suggest metastasis. Small bilateral pleural effusions. Musculoskeletal: A postsurgical change in the LEFT chest wall consistent with mastectomy. CT ABDOMEN and PELVIS FINDINGS Hepatobiliary: Small hypodense lesion in the superior aspect of the LEFT hepatic lobe has low attenuation consistent with a cyst. No biliary duct dilatation. Gallbladder normal. Pancreas: Pancreas is normal. No ductal dilatation. No pancreatic inflammation. Spleen: Normal spleen Adrenals/urinary tract: There is  bilateral nodular adrenal thickening. The lesion of concern on comparison CT is felt to represent a nodule enlargement of the medial limb of the RIGHT adrenal gland to 13 mm. . No renal obstruction. Ureters are normal. On the lateral projection there is suggestion hypodensity within the dome the bladder which is felt to represent streak artifact from adjacent contrast within the bowel. Stomach/Bowel: Stomach, small bowel, appendix are normal. There is diffuse vessel thickening in the ascending colon without pericolonic inflammation. No mass lesion or obstruction. The LEFT colon and rectosigmoid colon are normal. Vascular/Lymphatic: Abdominal aorta is normal caliber. There is no retroperitoneal or periportal lymphadenopathy. No pelvic lymphadenopathy. Reproductive: Uterus and ovaries are normal. Other: No free fluid. Musculoskeletal: No aggressive osseous lesion. Review of the MIP images confirms the above findings. IMPRESSION: Chest Impression: 1. Pedunculated lesion within the ascending aorta with differential including infectious versus neoplastic etiology. Transesophageal echocardiography may provide further characterization. 2. Uniform adventitial thickening involving the entirety the thoracic aorta. 3. No evidence of pulmonary metastasis. 4. Postsurgical changes in the LEFT chest wall Abdomen / Pelvis Impression: 1. Small hypodense lesion in the liver likely represents benign cyst. 2. Nodular enlargement of the RIGHT adrenal gland is indeterminate. Consider FDG PET scan for complete staging. 3. No metastatic disease identified elsewhere in the abdomen or pelvis. 4. Mucosal thickening in the ascending colon is nonspecific but suggests segmental colitis. Electronically Signed   By: Suzy Bouchard M.D.   On: 06/03/2015 13:57    Micro Results     No results found for this or any previous visit (from the past 240 hour(s)).     Today   Subjective:   Robin Cantrell today has no headache,no chest  or abdominal pain, feelsbetter  today.   Objective:   Blood pressure 165/84, pulse 100, temperature 97.9 F (36.6 C),  temperature source Oral, resp. rate 18, height 5\' 8"  (1.727 m), weight 64.955 kg (143 lb 3.2 oz), SpO2 100 %.   Intake/Output Summary (Last 24 hours) at 06/14/15 1415 Last data filed at 06/14/15 0600  Gross per 24 hour  Intake   1044 ml  Output    900 ml  Net    144 ml    Exam  General: Elderly female in no acute distress  HEENT: no scleral icterus, moist mucosa  Chest: Dressing over left breast flap with wound VAC in place  CVS: Normal S1 and S2, no murmurs rubs or gallop  GI: Soft, nondistended, nontender, bowel sounds present  Musculoskeletal: Warm, no edema  Data Review   CBC w Diff: Lab Results  Component Value Date   WBC 19.4* 06/13/2015   HGB 10.1* 06/13/2015   HCT 32.1* 06/13/2015   HCT 19.4* 06/03/2015   PLT 624* 06/13/2015   LYMPHOPCT 22 06/07/2015   MONOPCT 6 06/07/2015   EOSPCT 2 06/07/2015   BASOPCT 0 06/07/2015    CMP: Lab Results  Component Value Date   NA 139 06/13/2015   K 4.0 06/13/2015   CL 100* 06/13/2015   CO2 27 06/13/2015   BUN 18 06/13/2015   CREATININE 0.87 06/13/2015   PROT 5.1* 06/07/2015   ALBUMIN 1.8* 06/07/2015   BILITOT 0.7 06/07/2015   ALKPHOS 63 06/07/2015   AST 22 06/07/2015   ALT 18 06/07/2015  .   Total Time in preparing paper work, data evaluation and todays exam - 35 minutes  Katlynne Mckercher M.D on 06/14/2015 at 2:15 PM  [facility]   Office  [phone]

## 2015-06-14 NOTE — Progress Notes (Signed)
Physical Therapy Treatment Patient Details Name: Robin Cantrell MRN: [id number] DOB: Jun 30, 1945 Today's Date: 06/14/2015    History of Present Illness patient is a 70 yo female who presents with Infection of left breast, possible left breast cancer and vegetation in ascending aorta.  Pt s/p L radical mastectomy and wound vac placement on 06/02/15 and is thought to have mets (liver and adrenal masses), possibly to the brain.  Oncology following.      PT Comments    Assisted pt OOB to amb in hallway. Very unsteady gait with altered mental status and impaired cognition.  HIGH FALL RISK. Pt will need ST Rehab at SNF prior to returning home.   Follow Up Recommendations  SNF     Equipment Recommendations       Recommendations for Other Services       Precautions / Restrictions Precautions Precautions: Fall Precaution Comments: wound vac Restrictions Weight Bearing Restrictions: No    Mobility  Bed Mobility Overal bed mobility: Needs Assistance Bed Mobility: Supine to Sit;Sit to Supine     Supine to sit: Supervision Sit to supine: Supervision   General bed mobility comments: supervision for safety, impulosive and getting tangled in lines  Transfers Overall transfer level: Needs assistance Equipment used: None Transfers: Sit to/from Stand Sit to Stand: Min guard;Supervision         General transfer comment: cues for safety with lines.  Unsteady  Ambulation/Gait Ambulation/Gait assistance: Min assist;Mod assist Ambulation Distance (Feet): 255 Feet Assistive device: None Gait Pattern/deviations: Step-through pattern;Decreased stride length;Drifts right/left;Shuffle Gait velocity: VC's to decrease gait speed to increase safety/balance   General Gait Details: pt declined use of walker as she did not use one prior.  Very unsteady gait.  Drifting R and L.  Delayed corrective reaction responce.  HIGH FALL RISK.    Stairs            Wheelchair Mobility     Modified Rankin (Stroke Patients Only)       Balance                                    Cognition Arousal/Alertness: Awake/alert Behavior During Therapy: Impulsive                   General Comments: follows commands but demonstartes impaired safety cognition and altered mental status.     Exercises      General Comments        Pertinent Vitals/Pain Pain Assessment: No/denies pain    Home Living                      Prior Function            PT Goals (current goals can now be found in the care plan section) Progress towards PT goals: Progressing toward goals    Frequency  Min 2X/week    PT Plan Current plan remains appropriate    Co-evaluation             End of Session Equipment Utilized During Treatment: Gait belt Activity Tolerance: Patient tolerated treatment well Patient left: in bed;with call bell/phone within reach;with bed alarm set;with nursing/sitter in room     Time: 1135-1150 PT Time Calculation (min) (ACUTE ONLY): 15 min  Charges:  $Gait Training: 8-22 mins  G Codes:      Rica Koyanagi  PTA [facility]  Acute  Rehab Pager      [phone]

## 2015-06-14 NOTE — Progress Notes (Signed)
Aware patient will be discharged from the hospital soon to [facility]. Spoke with Hughes Better, social worker [address] [place], who confirms the patient's treatment calendar has been faxed to [facility]. Spoke with Tammy at [employer] [facility]. She confirms transportation has been arranged for the patient through [facility].

## 2015-06-14 NOTE — NC FL2 (Signed)
[place] LEVEL OF CARE SCREENING TOOL     IDENTIFICATION  Patient Name: Robin Cantrell Birthdate: 1945-11-10 Sex: female Admission Date (Current Location): 06/02/2015  [place] and [place] Number:  Robin Cantrell [id number] [place] and Address:  [facility],  [place] [address], [place]      Provider Number: [id number]  Attending Physician Name and Address:  Albertine Patricia, MD  Relative Name and Phone Number:       Current Level of Care: Hospital Recommended Level of Care: [place] Prior Approval Number:    Date Approved/Denied:   PASRR Number: [id number]  Discharge Plan: SNF    Current Diagnoses: Patient Active Problem List   Diagnosis Date Noted  . 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 Malignant neoplasm of central portion of left female breast ([place])   . 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 ([place])   . 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 Left breast mass 06/02/2015  . AKI (acute kidney injury) ([place]) 06/02/2015  . Infection of left breast 06/02/2015  . Breast infection 06/02/2015  . 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HCC)     Orientation RESPIRATION BLADDER Height & Weight     Self, Time, Situation, Place  Normal Continent (urgency at times) Weight: 143 lb 3.2 oz (64.955 kg) Height:  5\' 8"  (172.7 cm)  BEHAVIORAL SYMPTOMS/MOOD NEUROLOGICAL BOWEL NUTRITION STATUS   (n/a)  (NONE) Continent Diet (Diet Regular)  AMBULATORY STATUS COMMUNICATION OF NEEDS Skin   Limited Assist Verbally Wound Vac, Surgical wounds (Negative Pressure Wound VAC to Left Breast 125 mm/HG Continuous frequency change Mon, Wed, Fri)                       Personal Care Assistance Level of Assistance  Bathing, Feeding, Dressing Bathing Assistance: Limited assistance Feeding assistance: Independent Dressing Assistance: Limited assistance     Functional Limitations Info  Sight,  Hearing, Speech Sight Info: Adequate Hearing Info: Adequate Speech Info: Adequate    SPECIAL CARE FACTORS FREQUENCY  PT (By licensed PT), OT (By licensed OT)     PT Frequency: 5 x a week OT Frequency: 5 x a week            Contractures Contractures Info: Not present    Additional Factors Info  Code Status Code Status Info: FULL code status             Current Medications (06/14/2015):  This is the current hospital active medication list Current Facility-Administered Medications  Medication Dose Route Frequency Provider Last Rate Last Dose  . 0.9 %  sodium chloride infusion   Intravenous Once Dianne Dun, NP      . acetaminophen (TYLENOL) tablet 650 mg  650 mg Oral Q6H PRN Ivor Costa, MD       Or  . acetaminophen (TYLENOL) suppository 650 mg  650 mg Rectal Q6H PRN Ivor Costa, MD      . alum & mag hydroxide-simeth (MAALOX/MYLANTA) 200-200-20 MG/5ML suspension 30 mL  30 mL Oral Q6H PRN Ivor Costa, MD      . Ampicillin-Sulbactam (UNASYN) 3 g in sodium chloride 0.9 % 100 mL IVPB  3 g Intravenous Q6H Ivor Costa, MD   3 g at 06/14/15 0514  . dexamethasone (DECADRON) injection 4 mg  4 mg Intravenous Q12H Debbe Odea, MD   4 mg at 06/14/15 1009  . diphenhydrAMINE (BENADRYL) capsule 25 mg  25 mg Oral QHS Saima  Rizwan, MD   25 mg at 06/13/15 2107  . docusate sodium (COLACE) capsule 200 mg  200 mg Oral Daily Debbe Odea, MD   200 mg at 06/13/15 1005  . feeding supplement (BOOST / RESOURCE BREEZE) liquid 1 Container  1 Container Oral TID BM [place], DO   1 Container at 06/13/15 2132  . feeding supplement (ENSURE ENLIVE) (ENSURE ENLIVE) liquid 237 mL  237 mL Oral BID BM Nita Sells, MD   237 mL at 06/13/15 1006  . lip balm (CARMEX) ointment           . ondansetron (ZOFRAN) tablet 4 mg  4 mg Oral Q6H PRN Ivor Costa, MD       Or  . ondansetron [facility]) injection 4 mg  4 mg Intravenous Q6H PRN Ivor Costa, MD      . oxyCODONE-acetaminophen (PERCOCET/ROXICET)  5-325 MG per tablet 1-2 tablet  1-2 tablet Oral Q4H PRN Erby Pian, NP   2 tablet at 06/14/15 1009  . simvastatin (ZOCOR) tablet 10 mg  10 mg Oral q1800 Debbe Odea, MD   10 mg at 06/13/15 1809  . sodium chloride flush (NS) 0.9 % injection 3 mL  3 mL Intravenous Q12H Ivor Costa, MD   3 mL at 06/13/15 2133     Discharge Medications: Please see discharge summary for a list of discharge medications.  Relevant Imaging Results:  Relevant Lab Results:   Additional Information SSN: [id number]. Pt undergoing radiation treatment. Plan is for 10 fractions of radiation. Pt being simulated for radiation treatment today 06/12/15.   KIDD, SUZANNA A, LCSW

## 2015-06-15 ENCOUNTER — Ambulatory Visit
Admit: 2015-06-15 | Discharge: 2015-06-15 | Disposition: A | Discharge status: Home / Self Care | Payer: Medicare HMO | Attending: Radiation Oncology | Admitting: Radiation Oncology

## 2015-06-15 ENCOUNTER — Ambulatory Visit
Admission: RE | Admit: 2015-06-15 | Discharge: 2015-06-15 | Disposition: A | Discharge status: Home / Self Care | Payer: Medicare HMO | Source: Ambulatory Visit | Attending: Radiation Oncology | Admitting: Radiation Oncology

## 2015-06-15 VITALS — BP 156/77 | HR 63 | Resp 16 | Wt 145.0 lb

## 2015-06-15 DIAGNOSIS — Z510 Encounter for antineoplastic radiation therapy: Secondary | ICD-10-CM | POA: Diagnosis present

## 2015-06-15 DIAGNOSIS — C7931 Secondary malignant neoplasm of brain: Secondary | ICD-10-CM

## 2015-06-15 DIAGNOSIS — C801 Malignant (primary) neoplasm, unspecified: Secondary | ICD-10-CM | POA: Diagnosis not present

## 2015-06-15 NOTE — Progress Notes (Signed)
  Radiation Oncology         [phone]   Name: Robin Cantrell MRN: [id number]   Date: 06/15/2015  DOB: Sep 17, 1945   Weekly Radiation Therapy Management    ICD-9-CM ICD-10-CM   1. Brain metastasis (HCC) 198.3 C79.31     Current Dose: 9 Gy  Planned Dose:  30 Gy  Narrative The patient presents for routine under treatment assessment.  Residing now at [facility]. Denies pain or headaches. Happy to reports her vision has greatly improved. Denies ringing in her ears. Reports occasional dizziness. Reports fatigue. Denies nausea or vomiting. Taking decadron 2 mg bid. No thrush noted by the nurse.  Set-up films were reviewed. The chart was checked.  Physical Findings  weight is 145 lb (65.772 kg). Her blood pressure is 156/77 and her pulse is 63. Her respiration is 16. . Weight essentially stable. She presents to the clinic in a wheelchair and has a wound vac attached to the left chest wall.  Impression The patient is tolerating radiation.  Plan Continue treatment as planned. Continue decadron as prescribed.     Sheral Apley Tammi Klippel, M.D.  This document serves as a record of services personally performed by Tyler Pita, MD. It was created on his behalf by Darcus Austin, a trained medical scribe. The creation of this record is based on the scribe's personal observations and the provider's statements to them. This document has been checked and approved by the attending provider.

## 2015-06-15 NOTE — Progress Notes (Signed)
Residing now at [facility].Weight and vitals stable. Denies pain. Denies headaches. Happy to reports her vision has greatly improved. Denies ringing in her ears. Wound vac attached to left chest wall functioning properly. Reports occasional dizziness. Reports fatigue. Denies nausea or vomiting. Taking decadron 2 mg bid. No thrush noted.  BP 156/77 mmHg  Pulse 63  Resp 16  Wt 145 lb (65.772 kg) Wt Readings from Last 3 Encounters:  06/15/15 145 lb (65.772 kg)  06/09/15 143 lb 3.2 oz (64.955 kg)

## 2015-06-16 ENCOUNTER — Ambulatory Visit
Admit: 2015-06-16 | Discharge: 2015-06-16 | Disposition: A | Discharge status: Home / Self Care | Payer: Medicare HMO | Attending: Radiation Oncology | Admitting: Radiation Oncology

## 2015-06-16 ENCOUNTER — Ambulatory Visit: Payer: Self-pay | Admitting: Physician Assistant

## 2015-06-16 DIAGNOSIS — Z510 Encounter for antineoplastic radiation therapy: Secondary | ICD-10-CM | POA: Diagnosis not present

## 2015-06-19 ENCOUNTER — Ambulatory Visit: Payer: Medicare HMO

## 2015-06-19 ENCOUNTER — Telehealth: Payer: Self-pay | Admitting: Radiation Oncology

## 2015-06-19 NOTE — Telephone Encounter (Signed)
Understand from [place], RT for L3 that patient did not present for radiation treatment today. Phoned [facility] to inquire. Spoke with [employer]. She explains the patient "wasn't on the log for transport today." Received calendar for remaining treatment days with Nira Conn and she confirms this will be communicated with Tammy, who arranges transportation. Fernande Boyden, RT on L3 to cancel patient's treatment appointment for today since there is no transportation available for the patient. Encouraged Anderson Malta to print new calendar tomorrow and provide to the patient since the patient will now complete on 2/28.

## 2015-06-20 ENCOUNTER — Ambulatory Visit
Admit: 2015-06-20 | Discharge: 2015-06-20 | Disposition: A | Discharge status: Home / Self Care | Payer: Medicare HMO | Attending: Radiation Oncology | Admitting: Radiation Oncology

## 2015-06-20 DIAGNOSIS — Z510 Encounter for antineoplastic radiation therapy: Secondary | ICD-10-CM | POA: Diagnosis not present

## 2015-06-21 ENCOUNTER — Ambulatory Visit
Admit: 2015-06-21 | Discharge: 2015-06-21 | Disposition: A | Discharge status: Home / Self Care | Payer: Medicare HMO | Attending: Radiation Oncology | Admitting: Radiation Oncology

## 2015-06-21 DIAGNOSIS — Z510 Encounter for antineoplastic radiation therapy: Secondary | ICD-10-CM | POA: Diagnosis not present

## 2015-06-21 NOTE — Progress Notes (Signed)
Phoned [facility]. Spoke with Randell Patient, Therapist, sports. Explained the patient reports she hasn't had a bowel movement in several days. Charlene, RN states," I will take care of that right now..sounds like someone needs an enema."

## 2015-06-22 ENCOUNTER — Ambulatory Visit
Admit: 2015-06-22 | Discharge: 2015-06-22 | Disposition: A | Discharge status: Home / Self Care | Payer: Medicare HMO | Attending: Radiation Oncology | Admitting: Radiation Oncology

## 2015-06-22 DIAGNOSIS — Z510 Encounter for antineoplastic radiation therapy: Secondary | ICD-10-CM | POA: Diagnosis not present

## 2015-06-23 ENCOUNTER — Encounter (HOSPITAL_COMMUNITY): Payer: Self-pay | Admitting: *Deleted

## 2015-06-23 ENCOUNTER — Ambulatory Visit
Admit: 2015-06-23 | Discharge: 2015-06-23 | Disposition: A | Discharge status: Home / Self Care | Payer: Medicare HMO | Attending: Radiation Oncology | Admitting: Radiation Oncology

## 2015-06-23 ENCOUNTER — Ambulatory Visit
Admission: RE | Admit: 2015-06-23 | Discharge: 2015-06-23 | Disposition: A | Discharge status: Home / Self Care | Payer: Medicare HMO | Source: Ambulatory Visit | Attending: Radiation Oncology | Admitting: Radiation Oncology

## 2015-06-23 VITALS — BP 167/88 | HR 70 | Resp 16 | Wt 148.0 lb

## 2015-06-23 DIAGNOSIS — Z510 Encounter for antineoplastic radiation therapy: Secondary | ICD-10-CM | POA: Diagnosis not present

## 2015-06-23 DIAGNOSIS — C7931 Secondary malignant neoplasm of brain: Secondary | ICD-10-CM

## 2015-06-23 NOTE — Progress Notes (Signed)
  Radiation Oncology         [phone]   Name: Robin Cantrell MRN: [id number]   Date: 06/23/2015  DOB: 11-03-1945     Weekly Radiation Therapy Management    ICD-9-CM ICD-10-CM   1. Brain metastasis (HCC) 198.3 C79.31     Current Dose: 24 Gy  Planned Dose:  30 Gy  Narrative The patient presents for routine under treatment assessment.  Residing now at [facility].Weight and vitals stable. Denies pain. Denies headaches. Reports overall her vision has improved but, "still cropped." Denies ringing in her ears. Wound vac attached to left chest wall functioning properly. Reports occasional dizziness. Reports fatigue. Denies nausea or vomiting. Taking decadron 2 mg bid. No thrush noted. Reports constipation has resolved.  Set-up films were reviewed. The chart was checked.  Physical Findings  weight is 148 lb (67.132 kg). Her blood pressure is 167/88 and her pulse is 70. Her respiration is 16 and oxygen saturation is 100%. . Weight essentially stable. In wheelchair.  Impression The patient is tolerating radiation.  Plan Continue treatment as planned.      Sheral Apley Tammi Klippel, M.D.  This document serves as a record of services personally performed by Tyler Pita, MD. It was created on his behalf by Arlyce Harman, a trained medical scribe. The creation of this record is based on the scribe's personal observations and the provider's statements to them. This document has been checked and approved by the attending provider.

## 2015-06-23 NOTE — Progress Notes (Signed)
Residing now at [facility].Weight and vitals stable. Denies pain. Denies headaches. Reports overall her vision has improved but, "still cropped."  Denies ringing in her ears. Wound vac attached to left chest wall functioning properly. Reports occasional dizziness. Reports fatigue. Denies nausea or vomiting. Taking decadron 2 mg bid. No thrush noted. Reports constipation has resolved.   BP 167/88 mmHg  Pulse 70  Resp 16  Wt 148 lb (67.132 kg)  SpO2 100% Wt Readings from Last 3 Encounters:  06/23/15 148 lb (67.132 kg)  06/15/15 145 lb (65.772 kg)  06/09/15 143 lb 3.2 oz (64.955 kg)

## 2015-06-23 NOTE — Pre-Procedure Instructions (Signed)
    Robin Cantrell  06/23/2015        Your procedure is scheduled on Monday, February 27..  Report to [facility] Admitting at 6:45 A.M.  Call this number if you have problems the morning of surgery:[phone]   Remember:  Do not eat food or drink liquids after midnight Sunday, February 26  Take these medicines the morning of surgery with A SIP OF WATER : If needed Pain Medication, Zofran.   Do not wear jewelry, make-up or nail polish.  Do not wear lotions, powders, or perfumes.   Do not shave 48 hours prior to surgery.   Do not bring valuables to the hospital.  [facility] is not responsible for any belongings or valuables.  Contacts, dentures or bridgework may not be worn into surgery.

## 2015-06-25 MED ORDER — CEFAZOLIN SODIUM-DEXTROSE 2-3 GM-% IV SOLR
2.0000 g | INTRAVENOUS | Status: AC
  Administered 2015-06-26: 2 g via INTRAVENOUS
  Filled 2015-06-25: qty 50

## 2015-06-26 ENCOUNTER — Telehealth: Payer: Self-pay | Admitting: Radiation Oncology

## 2015-06-26 ENCOUNTER — Inpatient Hospital Stay (HOSPITAL_COMMUNITY): Payer: Medicare HMO | Admitting: Certified Registered"

## 2015-06-26 ENCOUNTER — Encounter (HOSPITAL_COMMUNITY): Payer: Self-pay | Admitting: Certified Registered"

## 2015-06-26 ENCOUNTER — Ambulatory Visit: Payer: Medicare HMO

## 2015-06-26 ENCOUNTER — Encounter (HOSPITAL_COMMUNITY)
Admission: AD | Disposition: A | Discharge status: Skilled Nursing Facility | Payer: Self-pay | Source: Ambulatory Visit | Attending: Plastic Surgery

## 2015-06-26 ENCOUNTER — Ambulatory Visit (HOSPITAL_COMMUNITY)
Admission: AD | Admit: 2015-06-26 | Discharge: 2015-06-26 | Disposition: A | Discharge status: Skilled Nursing Facility | Payer: Medicare HMO | Source: Ambulatory Visit | Attending: Plastic Surgery | Admitting: Plastic Surgery

## 2015-06-26 DIAGNOSIS — Z590 Homelessness: Secondary | ICD-10-CM | POA: Diagnosis not present

## 2015-06-26 DIAGNOSIS — Y836 Removal of other organ (partial) (total) as the cause of abnormal reaction of the patient, or of later complication, without mention of misadventure at the time of the procedure: Secondary | ICD-10-CM | POA: Insufficient documentation

## 2015-06-26 DIAGNOSIS — I6930 Unspecified sequelae of cerebral infarction: Secondary | ICD-10-CM | POA: Diagnosis not present

## 2015-06-26 DIAGNOSIS — Z9012 Acquired absence of left breast and nipple: Secondary | ICD-10-CM | POA: Insufficient documentation

## 2015-06-26 DIAGNOSIS — T8189XA Other complications of procedures, not elsewhere classified, initial encounter: Secondary | ICD-10-CM | POA: Diagnosis not present

## 2015-06-26 DIAGNOSIS — Z87891 Personal history of nicotine dependence: Secondary | ICD-10-CM | POA: Insufficient documentation

## 2015-06-26 DIAGNOSIS — S21002A Unspecified open wound of left breast, initial encounter: Secondary | ICD-10-CM | POA: Diagnosis present

## 2015-06-26 HISTORY — PX: SKIN SPLIT GRAFT: SHX444

## 2015-06-26 HISTORY — DX: Cerebral infarction, unspecified: I63.9

## 2015-06-26 HISTORY — DX: Malignant (primary) neoplasm, unspecified: C80.1

## 2015-06-26 LAB — BASIC METABOLIC PANEL
ANION GAP: 13 (ref 5–15)
BUN: 24 mg/dL — ABNORMAL HIGH (ref 6–20)
CHLORIDE: 101 mmol/L (ref 101–111)
CO2: 26 mmol/L (ref 22–32)
Calcium: 9.9 mg/dL (ref 8.9–10.3)
Creatinine, Ser: 0.84 mg/dL (ref 0.44–1.00)
GFR calc non Af Amer: >60 mL/min (ref >60)
Glucose, Bld: 88 mg/dL (ref 65–99)
Potassium: 3.9 mmol/L (ref 3.5–5.1)
SODIUM: 140 mmol/L (ref 135–145)

## 2015-06-26 LAB — CBC
HEMATOCRIT: 36.6 % (ref 36.0–46.0)
HEMOGLOBIN: 11.4 g/dL — AB (ref 12.0–15.0)
MCH: 25.7 pg — ABNORMAL LOW (ref 26.0–34.0)
MCHC: 31.1 g/dL (ref 30.0–36.0)
MCV: 82.4 fL (ref 78.0–100.0)
Platelets: 620 10*3/uL — ABNORMAL HIGH (ref 150–400)
RBC: 4.44 MIL/uL (ref 3.87–5.11)
RDW: 18.5 % — AB (ref 11.5–15.5)
WBC: 18.7 10*3/uL — AB (ref 4.0–10.5)

## 2015-06-26 SURGERY — APPLICATION, GRAFT, SKIN, SPLIT-THICKNESS
Anesthesia: General | Site: Breast | Laterality: Left

## 2015-06-26 MED ORDER — FENTANYL CITRATE (PF) 100 MCG/2ML IJ SOLN: 25.0000 ug | INTRAMUSCULAR | Status: DC | PRN

## 2015-06-26 MED ORDER — FENTANYL CITRATE (PF) 250 MCG/5ML IJ SOLN
INTRAMUSCULAR | Status: DC | PRN
  Administered 2015-06-26 (×2): 50 ug via INTRAVENOUS

## 2015-06-26 MED ORDER — EPHEDRINE SULFATE 50 MG/ML IJ SOLN
INTRAMUSCULAR | Status: AC
  Filled 2015-06-26: qty 1

## 2015-06-26 MED ORDER — PHENYLEPHRINE 40 MCG/ML (10ML) SYRINGE FOR IV PUSH (FOR BLOOD PRESSURE SUPPORT)
PREFILLED_SYRINGE | INTRAVENOUS | Status: AC
Start: 2015-06-26 — End: 2015-06-26
  Filled 2015-06-26: qty 10

## 2015-06-26 MED ORDER — SODIUM CHLORIDE 0.9 % IJ SOLN: INTRAMUSCULAR | Status: DC | PRN

## 2015-06-26 MED ORDER — ONDANSETRON HCL 4 MG/2ML IJ SOLN
INTRAMUSCULAR | Status: DC | PRN
  Administered 2015-06-26: 4 mg via INTRAVENOUS

## 2015-06-26 MED ORDER — EPHEDRINE SULFATE 50 MG/ML IJ SOLN
INTRAMUSCULAR | Status: DC | PRN
  Administered 2015-06-26 (×2): 10 mg via INTRAVENOUS

## 2015-06-26 MED ORDER — BUPIVACAINE-EPINEPHRINE (PF) 0.25% -1:200000 IJ SOLN
INTRAMUSCULAR | Status: AC
  Filled 2015-06-26: qty 30

## 2015-06-26 MED ORDER — 0.9 % SODIUM CHLORIDE (POUR BTL) OPTIME
TOPICAL | Status: DC | PRN
  Administered 2015-06-26: 1000 mL

## 2015-06-26 MED ORDER — PROPOFOL 10 MG/ML IV BOLUS
INTRAVENOUS | Status: AC
  Filled 2015-06-26: qty 20

## 2015-06-26 MED ORDER — MIDAZOLAM HCL 2 MG/2ML IJ SOLN
INTRAMUSCULAR | Status: AC
  Filled 2015-06-26: qty 2

## 2015-06-26 MED ORDER — SODIUM CHLORIDE 0.9 % IJ SOLN
INTRAMUSCULAR | Status: AC
  Filled 2015-06-26: qty 10

## 2015-06-26 MED ORDER — EPINEPHRINE HCL 1 MG/ML IJ SOLN
INTRAMUSCULAR | Status: AC
  Filled 2015-06-26: qty 1

## 2015-06-26 MED ORDER — DEXAMETHASONE 2 MG PO TABS: 2.0000 mg | ORAL_TABLET | Freq: Every day | ORAL | Status: DC

## 2015-06-26 MED ORDER — ONDANSETRON HCL 4 MG/2ML IJ SOLN
INTRAMUSCULAR | Status: AC
  Filled 2015-06-26: qty 2

## 2015-06-26 MED ORDER — PROMETHAZINE HCL 25 MG/ML IJ SOLN: 6.2500 mg | INTRAMUSCULAR | Status: DC | PRN

## 2015-06-26 MED ORDER — PROPOFOL 10 MG/ML IV BOLUS
INTRAVENOUS | Status: DC | PRN
  Administered 2015-06-26: 150 mg via INTRAVENOUS

## 2015-06-26 MED ORDER — SODIUM CHLORIDE 0.9 % IR SOLN
Status: DC | PRN
  Administered 2015-06-26: 500 mL

## 2015-06-26 MED ORDER — BUPIVACAINE-EPINEPHRINE 0.25% -1:200000 IJ SOLN
INTRAMUSCULAR | Status: DC | PRN
  Administered 2015-06-26: 30 mL

## 2015-06-26 MED ORDER — MIDAZOLAM HCL 2 MG/2ML IJ SOLN
INTRAMUSCULAR | Status: DC | PRN
  Administered 2015-06-26: 1 mg via INTRAVENOUS

## 2015-06-26 MED ORDER — FENTANYL CITRATE (PF) 250 MCG/5ML IJ SOLN
INTRAMUSCULAR | Status: AC
  Filled 2015-06-26: qty 5

## 2015-06-26 MED ORDER — LIDOCAINE HCL (CARDIAC) 20 MG/ML IV SOLN
INTRAVENOUS | Status: AC
  Filled 2015-06-26: qty 5

## 2015-06-26 MED ORDER — LACTATED RINGERS IV SOLN
INTRAVENOUS | Status: DC
  Administered 2015-06-26: 09:00:00 via INTRAVENOUS

## 2015-06-26 MED ORDER — LIDOCAINE HCL (CARDIAC) 20 MG/ML IV SOLN
INTRAVENOUS | Status: DC | PRN
  Administered 2015-06-26: 60 mg via INTRATRACHEAL

## 2015-06-26 SURGICAL SUPPLY — 56 items
BAG DECANTER FOR FLEXI CONT (MISCELLANEOUS) ×3 IMPLANT
BANDAGE ELASTIC 4 VELCRO ST LF (GAUZE/BANDAGES/DRESSINGS) IMPLANT
BANDAGE ELASTIC 6 VELCRO ST LF (GAUZE/BANDAGES/DRESSINGS) IMPLANT
BENZOIN TINCTURE PRP APPL 2/3 (GAUZE/BANDAGES/DRESSINGS) ×6 IMPLANT
BLADE 10 SAFETY STRL DISP (BLADE) ×3 IMPLANT
BLADE DERMATOME II (BLADE) ×3 IMPLANT
BLADE DERMATONE (BLADE) ×3 IMPLANT
CANISTER SUCTION 2500CC (MISCELLANEOUS) ×3 IMPLANT
COVER SURGICAL LIGHT HANDLE (MISCELLANEOUS) ×3 IMPLANT
DERMACARRIERS GRAFT 1 TO 1.5 (DISPOSABLE) ×3
DRAPE INCISE IOBAN 66X45 STRL (DRAPES) ×3 IMPLANT
DRAPE ORTHO SPLIT 77X108 STRL (DRAPES) ×4
DRAPE PROXIMA HALF (DRAPES) ×3 IMPLANT
DRAPE SURG ORHT 6 SPLT 77X108 (DRAPES) ×2 IMPLANT
DRSG ADAPTIC 3X8 NADH LF (GAUZE/BANDAGES/DRESSINGS) IMPLANT
DRSG CUTIMED SORBACT 7X9 (GAUZE/BANDAGES/DRESSINGS) ×3 IMPLANT
DRSG OPSITE 6X11 MED (GAUZE/BANDAGES/DRESSINGS) IMPLANT
DRSG PAD ABDOMINAL 8X10 ST (GAUZE/BANDAGES/DRESSINGS) ×3 IMPLANT
DRSG TELFA 3X8 NADH (GAUZE/BANDAGES/DRESSINGS) ×6 IMPLANT
DRSG VAC ATS SM SENSATRAC (GAUZE/BANDAGES/DRESSINGS) ×3 IMPLANT
ELECT CAUTERY BLADE 6.4 (BLADE) ×3 IMPLANT
ELECT REM PT RETURN 9FT ADLT (ELECTROSURGICAL) ×3
ELECTRODE REM PT RTRN 9FT ADLT (ELECTROSURGICAL) ×1 IMPLANT
FILTER STRAW FLUID ASPIR (MISCELLANEOUS) ×3 IMPLANT
GAUZE SPONGE 4X4 12PLY STRL (GAUZE/BANDAGES/DRESSINGS) ×3 IMPLANT
GAUZE XEROFORM 5X9 LF (GAUZE/BANDAGES/DRESSINGS) ×3 IMPLANT
GEL ULTRASOUND 20GR AQUASONIC (MISCELLANEOUS) IMPLANT
GLOVE BIO SURGEON STRL SZ 6.5 (GLOVE) ×4 IMPLANT
GLOVE BIO SURGEONS STRL SZ 6.5 (GLOVE) ×2
GOWN STRL REUS W/ TWL LRG LVL3 (GOWN DISPOSABLE) ×2 IMPLANT
GOWN STRL REUS W/TWL LRG LVL3 (GOWN DISPOSABLE) ×4
GRAFT DERMACARRIERS 1 TO 1.5 (DISPOSABLE) ×1 IMPLANT
HANDPIECE INTERPULSE COAX TIP (DISPOSABLE)
KIT BASIN OR (CUSTOM PROCEDURE TRAY) ×3 IMPLANT
KIT ROOM TURNOVER OR (KITS) ×3 IMPLANT
MATRIX SURGICAL PSM 7X10CM (Tissue) ×3 IMPLANT
NEEDLE 25GX 5/8IN NON SAFETY (NEEDLE) ×6 IMPLANT
NEEDLE SPNL 18GX3.5 QUINCKE PK (NEEDLE) ×3 IMPLANT
NS IRRIG 1000ML POUR BTL (IV SOLUTION) ×3 IMPLANT
PACK GENERAL/GYN (CUSTOM PROCEDURE TRAY) ×3 IMPLANT
PAD ARMBOARD 7.5X6 YLW CONV (MISCELLANEOUS) ×6 IMPLANT
SET HNDPC FAN SPRY TIP SCT (DISPOSABLE) IMPLANT
SLING ARM FOAM STRAP MED (SOFTGOODS) ×3 IMPLANT
STAPLER VISISTAT 35W (STAPLE) ×3 IMPLANT
SUT CHROMIC 4 0 PS 2 18 (SUTURE) IMPLANT
SUT SILK 4 0 PS 2 (SUTURE) ×6 IMPLANT
SUT VIC AB 5-0 P-3 18XBRD (SUTURE) IMPLANT
SUT VIC AB 5-0 P3 18 (SUTURE)
SUT VIC AB 5-0 PS2 18 (SUTURE) ×9 IMPLANT
SYR 3ML 25GX5/8 SAFETY (SYRINGE) ×3 IMPLANT
SYR 3ML LL SCALE MARK (SYRINGE) ×3 IMPLANT
SYR BULB IRRIGATION 50ML (SYRINGE) ×3 IMPLANT
SYR CONTROL 10ML LL (SYRINGE) ×3 IMPLANT
TOWEL OR 17X24 6PK STRL BLUE (TOWEL DISPOSABLE) ×3 IMPLANT
TOWEL OR 17X26 10 PK STRL BLUE (TOWEL DISPOSABLE) ×3 IMPLANT
UNDERPAD 30X30 INCONTINENT (UNDERPADS AND DIAPERS) ×3 IMPLANT

## 2015-06-26 NOTE — Anesthesia Postprocedure Evaluation (Signed)
Anesthesia Post Note  Patient: Robin Cantrell  Procedure(s) Performed: Procedure(s) (LRB): SKIN GRAFT TO LEFT BREAST WITH ACELL AND VAC (Left)  Patient location during evaluation: PACU Anesthesia Type: General Level of consciousness: awake and alert Pain management: pain level controlled Vital Signs Assessment: post-procedure vital signs reviewed and stable Respiratory status: spontaneous breathing, nonlabored ventilation, respiratory function stable and patient connected to nasal cannula oxygen Cardiovascular status: blood pressure returned to baseline and stable Postop Assessment: no signs of nausea or vomiting Anesthetic complications: no    Last Vitals:  Filed Vitals:   06/26/15 1050 06/26/15 1120  BP: 174/85 166/90  Pulse: 59 68  Temp:    Resp: 16 18    Last Pain:  Filed Vitals:   06/26/15 1129  PainSc: 0-No pain                 Effie Berkshire

## 2015-06-26 NOTE — Brief Op Note (Signed)
06/26/2015  9:26 AM  PATIENT:  Robin Cantrell  70 y.o. female  PRE-OPERATIVE DIAGNOSIS:  LEFT BREAST WOUND  POST-OPERATIVE DIAGNOSIS:  LEFT BREAST WOUND  PROCEDURE:  Procedure(s): SKIN GRAFT TO LEFT BREAST WITH ACELL AND VAC (Left)  SURGEON:  Surgeon(s) and Role:    * Claire S Dillingham, DO - Primary  PHYSICIAN ASSISTANT:   ASSISTANTS: none   ANESTHESIA:   general  EBL:     BLOOD ADMINISTERED:none  DRAINS: none   LOCAL MEDICATIONS USED:  MARCAINE     SPECIMEN:  No Specimen  DISPOSITION OF SPECIMEN:  N/A  COUNTS:  YES  TOURNIQUET:  * No tourniquets in log *  DICTATION: .Dragon Dictation  PLAN OF CARE: discharge  PATIENT DISPOSITION:  PACU - hemodynamically stable.   Delay start of Pharmacological VTE agent (>24hrs) due to surgical blood loss or risk of bleeding: no

## 2015-06-26 NOTE — Discharge Instructions (Signed)
Keep VAC in place and do not change. Left arm in sling to decrease movement of the arm and pectoralis muscle. [place] gel to the left leg daily.

## 2015-06-26 NOTE — Telephone Encounter (Signed)
Phoned [facility]. Unable to reach th nurse on her hall, her direct nurse, or the nursing supervisor of the day I requested to speak with the nursing director, Marzetta Board. Marzetta Board reports the patient is having skin grafted from her thigh to replace the skin on her chest and will not be coming for radiation treatment. Marzetta Board reports the staff plans to bring her for tomorrow's radiation treatment. Informed Merrilee Seashore, RT on L3 of this finding.

## 2015-06-26 NOTE — Progress Notes (Signed)
Nurse called [facility] and spoke with Nurse about last dosages on medications. Nurse stated that patient had left before she arrived and that patient had all of her medications on yesterday. Nurse from [facility] did not review medications instead she stated "I can assure you that she had all her medications yesterday."

## 2015-06-26 NOTE — OR Nursing (Signed)
Discharge paper work and instructions was given to Firefighter at [facility] and to the patient. Patient requested a copy of discharge paper work so the facility could have a copy and she could have a copy as well. [facility] came to pick pt up. Pt denied any pain during PACU, was able to eat crackers and drink sprite and coke without difficulty.

## 2015-06-26 NOTE — Transfer of Care (Signed)
Immediate Anesthesia Transfer of Care Note  Patient: Robin Cantrell  Procedure(s) Performed: Procedure(s): SKIN GRAFT TO LEFT BREAST WITH ACELL AND VAC (Left)  Patient Location: PACU  Anesthesia Type:General  Level of Consciousness: awake, alert , oriented and patient cooperative  Airway & Oxygen Therapy: Patient Spontanous Breathing and Patient connected to nasal cannula oxygen  Post-op Assessment: Report given to RN, Post -op Vital signs reviewed and stable and Patient moving all extremities X 4  Post vital signs: Reviewed and stable  Last Vitals:  Filed Vitals:   06/26/15 0836  Pulse: 60  Temp: 36.6 C  Resp: 18    Complications: No apparent anesthesia complications

## 2015-06-26 NOTE — Op Note (Addendum)
Operative Note   DATE OF OPERATION: 06/26/2015  LOCATION: [facility]  SURGICAL DIVISION: Plastic Surgery  PREOPERATIVE DIAGNOSES:  Left breast wound after mastectomy 14 x 21 cm  POSTOPERATIVE DIAGNOSES:  same  PROCEDURE:  Split thickness skin graft to left breast 7 x 10 cm with placement of VAC.  Placement of Acell (7 x 10 cm sheet) to donor site  SURGEON: [employer] Avondre Richens, DO  ASSISTANT: Shawn Rayburn, PA  ANESTHESIA:  General.   COMPLICATIONS: None.   INDICATIONS FOR PROCEDURE:  The patient, Robin Cantrell is a 70 y.o. female born on Apr 23, 1946, is here for treatment of left breast wound after resection of breast cancer. MRN: [id number]  CONSENT:  Informed consent was obtained directly from the patient. Risks, benefits and alternatives were fully discussed. Specific risks including but not limited to bleeding, infection, hematoma, seroma, scarring, pain, infection, contracture, asymmetry, wound healing problems, and need for further surgery were all discussed. The patient did have an ample opportunity to have questions answered to satisfaction.   DESCRIPTION OF PROCEDURE:  The patient was taken to the operating room. SCDs were placed and IV antibiotics were given on the unit. The patient's operative site was prepped and draped in a sterile fashion. A time out was performed and all information was confirmed to be correct.  General anesthesia was administered.  The wound was irrigated with antibiotic solution.  The local was injected at the 7 x 10 cm area of the left lateral thigh donor site.  The dermatome set at 03/999 inch was used to obtain the graft at the left thigh with the 2 inch guard.  The graft was placed on the left breast wound 7 x 10 cm.  It was sutured in place with the 5-0 Vicryl.  The Sorbac was placed over the graft with the VAC secured on top with an excellent seal.  The Acell sheet 7 x 10 cm was placed on the donor site and secured with  5-0 Vicryl.   The Sorbac was secured with 4-0 Silk followed by ky placement and an ABD.  The patient tolerated the procedure well.  There were no complications. The patient was allowed to wake from anesthesia, extubated and taken to the recovery room in satisfactory condition. The PA assisted throughout the case.

## 2015-06-26 NOTE — Anesthesia Procedure Notes (Signed)
Date/Time: 06/26/2015 9:32 AM Performed by: Lance Coon Pre-anesthesia Checklist: Patient identified, Timeout performed, Emergency Drugs available, Suction available and Patient being monitored Patient Re-evaluated:Patient Re-evaluated prior to inductionOxygen Delivery Method: Circle system utilized Preoxygenation: Pre-oxygenation with 100% oxygen Intubation Type: IV induction LMA: LMA inserted LMA Size: 4.0 Number of attempts: 1 Tube secured with: Tape Dental Injury: Teeth and Oropharynx as per pre-operative assessment

## 2015-06-26 NOTE — Anesthesia Preprocedure Evaluation (Addendum)
Anesthesia Evaluation  Patient identified by MRN, date of birth, ID band Patient awake    Reviewed: Allergy & Precautions, NPO status , Patient's Chart, lab work & pertinent test results  Airway Mallampati: II  TM Distance: >3 FB Neck ROM: Full    Dental no notable dental hx. (+) Poor Dentition, Missing, Dental Advisory Given,    Pulmonary neg pulmonary ROS, former smoker,    Pulmonary exam normal breath sounds clear to auscultation       Cardiovascular Normal cardiovascular exam Rhythm:Regular Rate:Normal   Left ventricle: Systolic function was normal. The estimated ejection fraction was in the range of 55% to 60%. Wall motion was normal; there were no regional wall motion abnormalities. - Aortic valve: There was trivial regurgitation. - Aorta: There was a mobile, pedunculated mass in the ascending aorta measuring. There are at least 2 heads on this mass, measuring 2.1 cm x 1.1 cm and 1.2 cm x 0.5 cm. Differential includes thrombus, vegetation, and malignancy. - Left atrium: No evidence of thrombus in the atrial cavity or appendage. No evidence of thrombus in the atrial cavity or appendage. - Right ventricle: The cavity size was normal. Wall thickness was normal. Systolic function was normal. - Right atrium: No evidence of thrombus in the atrial cavity or appendage. - Tricuspid valve: There was mild regurgitation. Peak RV-RA gradient (S): 22 mm Hg. - Pulmonic valve: There was trivial regurgitation.     Neuro/Psych CVA, Residual Symptoms negative psych ROS   GI/Hepatic negative GI ROS, Neg liver ROS,   Endo/Other  negative endocrine ROS  Renal/GU negative Renal ROS  negative genitourinary   Musculoskeletal negative musculoskeletal ROS (+)   Abdominal   Peds negative pediatric ROS (+)  Hematology negative hematology ROS (+)   Anesthesia Other Findings   Reproductive/Obstetrics negative OB ROS                          Anesthesia Physical Anesthesia Plan  ASA: III  Anesthesia Plan: General   Post-op Pain Management:    Induction: Intravenous  Airway Management Planned: LMA  Additional Equipment:   Intra-op Plan:   Post-operative Plan: Extubation in OR  Informed Consent: I have reviewed the patients History and Physical, chart, labs and discussed the procedure including the risks, benefits and alternatives for the proposed anesthesia with the patient or authorized representative who has indicated his/her understanding and acceptance.   Dental advisory given  Plan Discussed with: CRNA and Surgeon  Anesthesia Plan Comments:         Anesthesia Quick Evaluation

## 2015-06-26 NOTE — Telephone Encounter (Signed)
Thanks Sam, I would like to taper her dexamethasone.  She did not have a med-list or MD recommendation paper with her Friday.  If we can get those, I think we want to go from BID to daily. MM

## 2015-06-26 NOTE — Interval H&P Note (Signed)
History and Physical Interval Note:  06/26/2015 7:54 AM  Robin Cantrell  has presented today for surgery, with the diagnosis of LEFT BREAST WOUND  The various methods of treatment have been discussed with the patient and family. After consideration of risks, benefits and other options for treatment, the patient has consented to  Procedure(s): SKIN GRAFT TO LEFT BREAST WITH ACELL AND VAC (Left) as a surgical intervention .  The patient's history has been reviewed, patient examined, no change in status, stable for surgery.  I have reviewed the patient's chart and labs.  Questions were answered to the patient's satisfaction.     Wallace Going

## 2015-06-26 NOTE — H&P (View-Only) (Signed)
Reason for Consult: Breast wound Referring Physician: Dr. Verita Lamb  Robin Cantrell is an 70 y.o. female.  HPI: The patient is a 70 yrs old bf here for treatment of her left chest mass / wound.  She presented to the ED with a large open wound on her left breast / chest.  She was taken to the OR by Dr. Georgette Dover for resection.  Path is pending.  Cultures positive for infection.  She states that she is homeless.  She has two sons who live in [place] and are coming to see her this week.  She had an ankle fracture several years ago and states she is otherwise healthy.  The lesion started on her left breast as a small lump a few months ago.  She thought it was a boil.  It quickly got unmanageable. Nothing made it better and between the smell, the drainage and the pain she decided to seek care.  The chart was reviewed the pictures reviewed.  This clear consumed the majority of her left breast.  History reviewed. No pertinent past medical history.  Past Surgical History  Procedure Laterality Date  . Ankle fracture surgery      History reviewed. No pertinent family history.  Social History:  reports that she quit smoking about 22 years ago. Her smoking use included Cigarettes. She does not have any smokeless tobacco history on file. She reports that she does not drink alcohol or use illicit drugs.  Allergies: No Known Allergies  Medications: I have reviewed the patient's current medications.  Results for orders placed or performed during the hospital encounter of 06/02/15 (from the past 48 hour(s))  Culture, blood (Routine X 2) w Reflex to ID Panel     Status: None (Preliminary result)   Collection Time: 06/03/15  3:00 PM  Result Value Ref Range   Specimen Description BLOOD RIGHT ANTECUBITAL    Special Requests IN PEDIATRIC BOTTLE 3CC    Culture NO GROWTH 1 DAY    Report Status PENDING   Culture, blood (Routine X 2) w Reflex to ID Panel     Status: None (Preliminary result)   Collection  Time: 06/03/15  3:05 PM  Result Value Ref Range   Specimen Description BLOOD BLOOD RIGHT HAND    Special Requests IN PEDIATRIC BOTTLE 3CC    Culture NO GROWTH 1 DAY    Report Status PENDING   Glucose, capillary     Status: Abnormal   Collection Time: 06/04/15  7:59 AM  Result Value Ref Range   Glucose-Capillary 109 (H) 65 - 99 mg/dL   Comment 1 Capillary Specimen   Basic metabolic panel     Status: Abnormal   Collection Time: 06/05/15  4:33 AM  Result Value Ref Range   Sodium 141 135 - 145 mmol/L   Potassium 3.3 (L) 3.5 - 5.1 mmol/L   Chloride 109 101 - 111 mmol/L   CO2 24 22 - 32 mmol/L   Glucose, Bld 85 65 - 99 mg/dL   BUN 7 6 - 20 mg/dL   Creatinine, Ser 0.98 0.44 - 1.00 mg/dL   Calcium 7.5 (L) 8.9 - 10.3 mg/dL   GFR calc non Af Amer 58 (L) >60 mL/min   GFR calc Af Amer >60 >60 mL/min    Comment: (NOTE) The eGFR has been calculated using the CKD EPI equation. This calculation has not been validated in all clinical situations. eGFR's persistently <60 mL/min signify possible Chronic Kidney Disease.    Anion gap 8  5 - 15  CBC with Differential/Platelet     Status: Abnormal   Collection Time: 06/05/15  4:33 AM  Result Value Ref Range   WBC 8.7 4.0 - 10.5 K/uL   RBC 2.55 (L) 3.87 - 5.11 MIL/uL   Hemoglobin 6.2 (LL) 12.0 - 15.0 g/dL    Comment: REPEATED TO VERIFY CRITICAL VALUE NOTED.  VALUE IS CONSISTENT WITH PREVIOUSLY REPORTED AND CALLED VALUE.    HCT 19.4 (L) 36.0 - 46.0 %   MCV 76.1 (L) 78.0 - 100.0 fL   MCH 24.3 (L) 26.0 - 34.0 pg   MCHC 32.0 30.0 - 36.0 g/dL   RDW 14.4 11.5 - 15.5 %   Platelets 439 (H) 150 - 400 K/uL   Neutrophils Relative % 62 %   Neutro Abs 5.4 1.7 - 7.7 K/uL   Lymphocytes Relative 29 %   Lymphs Abs 2.5 0.7 - 4.0 K/uL   Monocytes Relative 7 %   Monocytes Absolute 0.6 0.1 - 1.0 K/uL   Eosinophils Relative 2 %   Eosinophils Absolute 0.2 0.0 - 0.7 K/uL   Basophils Relative 1 %   Basophils Absolute 0.1 0.0 - 0.1 K/uL  Creatinine, urine,  random     Status: None   Collection Time: 06/05/15  5:00 AM  Result Value Ref Range   Creatinine, Urine 15.85 mg/dL  Sodium, urine, random     Status: None   Collection Time: 06/05/15  5:00 AM  Result Value Ref Range   Sodium, Ur 117 mmol/L  Glucose, capillary     Status: None   Collection Time: 06/05/15  8:43 AM  Result Value Ref Range   Glucose-Capillary 79 65 - 99 mg/dL   Comment 1 Notify RN     Ct Head W Wo Contrast  06/03/2015  CLINICAL DATA:  70 year old female with newly diagnosed breast cancer status post mastectomy yesterday. Subsequent encounter. EXAM: CT HEAD WITHOUT AND WITH CONTRAST TECHNIQUE: Contiguous axial images were obtained from the base of the skull through the vertex without and with intravenous contrast CONTRAST:  82m OMNIPAQUE IOHEXOL 350 MG/ML SOLN COMPARISON:  Chest CT 06/02/2015. FINDINGS: Mild right mastoid effusion. Negative visible nasopharynx. Other Visualized paranasal sinuses and mastoids are clear. No acute or suspicious osseous lesion identified in the visible skull. Visualized scalp soft tissues are within normal limits. Along the right lateral hemisphere there is a 3.3 cm length 1.4 cm with peripheral, dural based appearing hyperdense lesion with enhancement at the level of the operculum (series 4, image 15). Despite this lesions size, there is no definite associated edema. There is patchy and confluent bilateral cerebral white matter hypodensity which might in part be small vessel related, however, following contrast there are multiple subcentimeter round enhancing lesions (most about 7 mm diameter) identified in the right frontal lobe (series 6, images 13 and 14), left frontal lobe, and posterior left temporal lobe. The largest of these lesions is a 14 mm left operculum metastasis on series 6, image 17. Associated vasogenic edema but no significant mass effect. In all 6 such lesions are identified. Despite these findings there is no intracranial mass effect.  Basilar cisterns remain patent. No chronic cortically based infarct identified. No ventriculomegaly. No acute intracranial hemorrhage identified. Major intracranial vascular structures are enhancing. IMPRESSION: 1. Positive for metastatic disease to the brain. At least 6 round enhancing brain masses in both hemispheres, most are small - about 7 mm in size while the largest is 14 mm. Some vasogenic edema but no significant intracranial  mass effect. 2. Superimposed moderate-sized 3 cm right operculum extra-axial appearing hyperdense an enhancing mass. Although dural based metastasis is possible this may instead reflect a superimposed benign meningioma. 3. Follow-up brain MRI without and with contrast may clarify the diagnosis of #2 and detect additional small brain metastases. Electronically Signed   By: Genevie Ann M.D.   On: 06/03/2015 13:41   Ct Abdomen Pelvis W Contrast  06/03/2015  CLINICAL DATA:  Patient status post LEFT mastectomy for metastatic breast cancer. Patient did lesion discovered on CT exam from 06/02/2015. EXAM: CT ANGIOGRAPHY CHEST CT ABDOMEN AND PELVIS WITH CONTRAST TECHNIQUE: Multidetector CT imaging of the chest was performed using the standard protocol during bolus administration of intravenous contrast. Multiplanar CT image reconstructions and MIPs were obtained to evaluate the vascular anatomy. Multidetector CT imaging of the abdomen and pelvis was performed using the standard protocol during bolus administration of intravenous contrast. CONTRAST:  80 mL Omnipaque COMPARISON:  CT 06/02/2015. FINDINGS: CTA CHEST FINDINGS Mediastinum/Nodes: Again demonstrated a irregular mass within the ascending aorta with a stalk like attachment to the aortic wall seen best on image 72, series 12 sagittal series. At its attachment the lesion puckers the wall of the aorta. The lesion is difficult to measure but measure approximately 1.4 cm (image 60, series 8). There is abnormal thickening of the adventitia of  the aorta of throughout the aorta. No additional lesions are present within the aorta. There is uniform thickening of the adventitia throughout aorta. Great vessels are normal. No pulmonary embolism. No pericardial fluid. The LEFT and RIGHT ventricle appear normal. Normal atria Lungs/Pleura: No pulmonary nodularity suggest metastasis. Small bilateral pleural effusions. Musculoskeletal: A postsurgical change in the LEFT chest wall consistent with mastectomy. CT ABDOMEN and PELVIS FINDINGS Hepatobiliary: Small hypodense lesion in the superior aspect of the LEFT hepatic lobe has low attenuation consistent with a cyst. No biliary duct dilatation. Gallbladder normal. Pancreas: Pancreas is normal. No ductal dilatation. No pancreatic inflammation. Spleen: Normal spleen Adrenals/urinary tract: There is bilateral nodular adrenal thickening. The lesion of concern on comparison CT is felt to represent a nodule enlargement of the medial limb of the RIGHT adrenal gland to 13 mm. . No renal obstruction. Ureters are normal. On the lateral projection there is suggestion hypodensity within the dome the bladder which is felt to represent streak artifact from adjacent contrast within the bowel. Stomach/Bowel: Stomach, small bowel, appendix are normal. There is diffuse vessel thickening in the ascending colon without pericolonic inflammation. No mass lesion or obstruction. The LEFT colon and rectosigmoid colon are normal. Vascular/Lymphatic: Abdominal aorta is normal caliber. There is no retroperitoneal or periportal lymphadenopathy. No pelvic lymphadenopathy. Reproductive: Uterus and ovaries are normal. Other: No free fluid. Musculoskeletal: No aggressive osseous lesion. Review of the MIP images confirms the above findings. IMPRESSION: Chest Impression: 1. Pedunculated lesion within the ascending aorta with differential including infectious versus neoplastic etiology. Transesophageal echocardiography may provide further  characterization. 2. Uniform adventitial thickening involving the entirety the thoracic aorta. 3. No evidence of pulmonary metastasis. 4. Postsurgical changes in the LEFT chest wall Abdomen / Pelvis Impression: 1. Small hypodense lesion in the liver likely represents benign cyst. 2. Nodular enlargement of the RIGHT adrenal gland is indeterminate. Consider FDG PET scan for complete staging. 3. No metastatic disease identified elsewhere in the abdomen or pelvis. 4. Mucosal thickening in the ascending colon is nonspecific but suggests segmental colitis. Electronically Signed   By: Suzy Bouchard M.D.   On: 06/03/2015 13:57   Ct  Angio Chest Aorta W/cm &/or Wo/cm  06/03/2015  CLINICAL DATA:  Patient status post LEFT mastectomy for metastatic breast cancer. Patient did lesion discovered on CT exam from 06/02/2015. EXAM: CT ANGIOGRAPHY CHEST CT ABDOMEN AND PELVIS WITH CONTRAST TECHNIQUE: Multidetector CT imaging of the chest was performed using the standard protocol during bolus administration of intravenous contrast. Multiplanar CT image reconstructions and MIPs were obtained to evaluate the vascular anatomy. Multidetector CT imaging of the abdomen and pelvis was performed using the standard protocol during bolus administration of intravenous contrast. CONTRAST:  80 mL Omnipaque COMPARISON:  CT 06/02/2015. FINDINGS: CTA CHEST FINDINGS Mediastinum/Nodes: Again demonstrated a irregular mass within the ascending aorta with a stalk like attachment to the aortic wall seen best on image 72, series 12 sagittal series. At its attachment the lesion puckers the wall of the aorta. The lesion is difficult to measure but measure approximately 1.4 cm (image 60, series 8). There is abnormal thickening of the adventitia of the aorta of throughout the aorta. No additional lesions are present within the aorta. There is uniform thickening of the adventitia throughout aorta. Great vessels are normal. No pulmonary embolism. No pericardial  fluid. The LEFT and RIGHT ventricle appear normal. Normal atria Lungs/Pleura: No pulmonary nodularity suggest metastasis. Small bilateral pleural effusions. Musculoskeletal: A postsurgical change in the LEFT chest wall consistent with mastectomy. CT ABDOMEN and PELVIS FINDINGS Hepatobiliary: Small hypodense lesion in the superior aspect of the LEFT hepatic lobe has low attenuation consistent with a cyst. No biliary duct dilatation. Gallbladder normal. Pancreas: Pancreas is normal. No ductal dilatation. No pancreatic inflammation. Spleen: Normal spleen Adrenals/urinary tract: There is bilateral nodular adrenal thickening. The lesion of concern on comparison CT is felt to represent a nodule enlargement of the medial limb of the RIGHT adrenal gland to 13 mm. . No renal obstruction. Ureters are normal. On the lateral projection there is suggestion hypodensity within the dome the bladder which is felt to represent streak artifact from adjacent contrast within the bowel. Stomach/Bowel: Stomach, small bowel, appendix are normal. There is diffuse vessel thickening in the ascending colon without pericolonic inflammation. No mass lesion or obstruction. The LEFT colon and rectosigmoid colon are normal. Vascular/Lymphatic: Abdominal aorta is normal caliber. There is no retroperitoneal or periportal lymphadenopathy. No pelvic lymphadenopathy. Reproductive: Uterus and ovaries are normal. Other: No free fluid. Musculoskeletal: No aggressive osseous lesion. Review of the MIP images confirms the above findings. IMPRESSION: Chest Impression: 1. Pedunculated lesion within the ascending aorta with differential including infectious versus neoplastic etiology. Transesophageal echocardiography may provide further characterization. 2. Uniform adventitial thickening involving the entirety the thoracic aorta. 3. No evidence of pulmonary metastasis. 4. Postsurgical changes in the LEFT chest wall Abdomen / Pelvis Impression: 1. Small hypodense  lesion in the liver likely represents benign cyst. 2. Nodular enlargement of the RIGHT adrenal gland is indeterminate. Consider FDG PET scan for complete staging. 3. No metastatic disease identified elsewhere in the abdomen or pelvis. 4. Mucosal thickening in the ascending colon is nonspecific but suggests segmental colitis. Electronically Signed   By: Suzy Bouchard M.D.   On: 06/03/2015 13:57    Review of Systems  Constitutional: Negative.   HENT: Negative.   Eyes: Negative.   Respiratory: Negative.   Cardiovascular: Negative.   Gastrointestinal: Negative.   Genitourinary: Negative.   Musculoskeletal: Negative.   Skin: Negative.   Neurological: Negative for seizures.  Psychiatric/Behavioral: Negative.    Blood pressure 170/76, pulse 65, temperature 98.2 F (36.8 C), temperature source Oral, resp.  rate 17, height 5' 8"  (1.727 m), weight 63.4 kg (139 lb 12.4 oz), SpO2 100 %. Physical Exam  Constitutional: She is oriented to person, place, and time. She appears well-developed.  HENT:  Head: Normocephalic and atraumatic.  Eyes: Conjunctivae and EOM are normal. Pupils are equal, round, and reactive to light.  Cardiovascular: Normal rate.   Respiratory: Effort normal.    Neurological: She is alert and oriented to person, place, and time.  Skin: Skin is warm.  Psychiatric: She has a normal mood and affect. Her behavior is normal. Thought content normal.    Assessment/Plan: Will discuss with primary team.  Will need coverage but durability needs will depend on treatment plan for the tumor, ie. Radiation.  Her options are a skin graft versus a latissimus flap.  Agree with VAC for now. Nutrition consult for maximizing nutritional status.  Start multivitamin, Vit C and Zinc.  Wallace Going 06/05/2015, 12:13 PM

## 2015-06-26 NOTE — Telephone Encounter (Signed)
Phoned Stacy, RN at [facility] and provided her with a verbal order to taper the patient's decadron 2 mg from bid to once daily. Marzetta Board, RN verbalized understanding. Faxed paper copy of order to [place], [place] at [phone]. Confirmation fax of delivery obtained.

## 2015-06-27 ENCOUNTER — Encounter (HOSPITAL_COMMUNITY): Payer: Self-pay | Admitting: Plastic Surgery

## 2015-06-27 ENCOUNTER — Ambulatory Visit
Admission: RE | Admit: 2015-06-27 | Discharge: 2015-06-27 | Disposition: A | Discharge status: Home / Self Care | Payer: Medicare HMO | Source: Ambulatory Visit | Attending: Radiation Oncology | Admitting: Radiation Oncology

## 2015-06-27 ENCOUNTER — Ambulatory Visit: Payer: Medicare HMO

## 2015-06-27 DIAGNOSIS — Z510 Encounter for antineoplastic radiation therapy: Secondary | ICD-10-CM | POA: Diagnosis not present

## 2015-06-28 ENCOUNTER — Ambulatory Visit
Admission: RE | Admit: 2015-06-28 | Discharge: 2015-06-28 | Disposition: A | Discharge status: Home / Self Care | Payer: Medicare HMO | Source: Ambulatory Visit | Attending: Radiation Oncology | Admitting: Radiation Oncology

## 2015-06-28 ENCOUNTER — Ambulatory Visit: Payer: Medicare HMO

## 2015-06-29 ENCOUNTER — Encounter: Payer: Self-pay | Admitting: Radiation Oncology

## 2015-06-29 ENCOUNTER — Ambulatory Visit
Admission: RE | Admit: 2015-06-29 | Discharge: 2015-06-29 | Disposition: A | Discharge status: Home / Self Care | Payer: Medicare HMO | Source: Ambulatory Visit | Attending: Radiation Oncology | Admitting: Radiation Oncology

## 2015-07-03 ENCOUNTER — Telehealth: Payer: Self-pay | Admitting: Hematology and Oncology

## 2015-07-03 ENCOUNTER — Ambulatory Visit (INDEPENDENT_AMBULATORY_CARE_PROVIDER_SITE_OTHER): Payer: Medicare HMO | Admitting: Internal Medicine

## 2015-07-03 ENCOUNTER — Encounter: Payer: Self-pay | Admitting: Internal Medicine

## 2015-07-03 VITALS — BP 147/97 | HR 81 | Temp 98.2°F | Wt 148.0 lb

## 2015-07-03 DIAGNOSIS — L299 Pruritus, unspecified: Secondary | ICD-10-CM

## 2015-07-03 MED ORDER — HYDROXYZINE HCL 10 MG PO TABS: 10.0000 mg | ORAL_TABLET | Freq: Three times a day (TID) | ORAL | Status: DC | PRN

## 2015-07-03 NOTE — Telephone Encounter (Signed)
Dr Baxter Flattery called to schedule appt requesting Dr. Lindi Adie to f/u with pt

## 2015-07-03 NOTE — Progress Notes (Signed)
RFV: follow up for chest wall wound  Subjective:    Patient ID: Robin Cantrell, female    DOB: 1945-12-13, 70 y.o.   MRN: [id number]  HPI 70yo F who was admitted on 2/3 for left breast ulcerative necrotic wound, developing over a course of 5-6 months. On Chest Ct, the ulcerative mass was measured 13 cm in diameter concerning for malignancy in addition to finding 3cm vegetation attached to ascending aortic wall. This finding led to her having a TEE which found 2 x 1 cm penduculated mass on the aortic arch. The blood cx did not identify any bacteria, (did have isolated contaminant corynebacter). She was treated initially with IV amp/sub for 2 weeks to treat wound infection for which it is healing slowly. 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 She was also told that she had infarct though it is not completely clear if this correlates to metastatic lesions since she describes left hand weakness and visual field cut for which she is getting physical therapy at SNF . She underwent incision and debridement of the left breast wound on 2/9 with A CELLplacement and VAC placement.  Since her diagnosis, she has been receiving whole brain radiation by Dr. Tammi Klippel but has not yet been seen by medical oncologist for evauation for chemotherapy. On 2/27 she underwent split thickness skin graft to left breast 7 x 10 cm with placement of VAC. Placement of Acell (7 x 10 cm sheet) to donor site. Healing slowly. No reports of infection from last OR report.  Since then she is having her wound vac changed at the skilled nursing home. She is currently on daily dosing of dexamethasone until she sees Dr. Tammi Klippel at the end of the month. The patient has spent majority of the time discussing reconstructive breast surgery  She denies any purulent  drainage from wound bed. No fever, chills, nightsweats  No Known Allergies Current Outpatient Prescriptions on File Prior to Visit  Medication Sig Dispense Refill  . dexamethasone (DECADRON) 2 MG tablet Take 1 tablet (2 mg total) by mouth daily.    . feeding supplement, ENSURE ENLIVE, (ENSURE ENLIVE) LIQD Take 237 mLs by mouth 2 (two) times daily between meals. Or alternative formulary at facility 237 mL 12  . simvastatin (ZOCOR) 10 MG tablet Take 1 tablet (10 mg total) by mouth daily at 6 PM.    . sennosides-docusate sodium (SENOKOT-S) 8.6-50 MG tablet Take 2 tablets by mouth at bedtime. Reported on 07/03/2015     No current facility-administered medications on file prior to visit.   Active Ambulatory Problems    Diagnosis Date Noted  . Left breast mass 06/02/2015  . AKI (acute kidney injury) ([place]) 06/02/2015  . Infection of left breast 06/02/2015  . Breast infection 06/02/2015  . 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[place])   . 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 ([place])   . 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 Malignant neoplasm of central portion of left female breast ([place])   . 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 Brain metastasis ([place]) 06/14/2015   Resolved Ambulatory Problems    Diagnosis Date Noted  . Breast cancer ([place]) 06/02/2015  . Homeless 06/02/2015  . Aorta disorder ([place])   . Bacteremia    Past Medical History  Diagnosis Date  . Stroke ([place]) 06/10/15  .  Cancer [facility])    Social History  Substance Use Topics  . Smoking status: Former Smoker    Types: Cigarettes    Quit date: 06/04/1993  . Smokeless tobacco: None  . Alcohol Use: No    Soc hx: She states that she can stay at her niece's residence in [place] once she gets discharged from SNF   Review of Systems  Constitutional: Negative for fever, chills, diaphoresis, activity change, appetite change, fatigue and unexpected weight change.  HENT: Negative for  congestion, sore throat, rhinorrhea, sneezing, trouble swallowing and sinus pressure.  Eyes: Negative for photophobia and visual disturbance.  Respiratory: Negative for cough, chest tightness, shortness of breath, wheezing and stridor.  Cardiovascular: Negative for chest pain, palpitations and leg swelling.  Gastrointestinal: Negative for nausea, vomiting, abdominal pain, diarrhea, constipation, blood in stool, abdominal distention and anal bleeding.  Genitourinary: Negative for dysuria, hematuria, flank pain and difficulty urinating.  Musculoskeletal: Negative for myalgias, back pain, joint swelling, arthralgias and gait problem.  Skin: Negative for color change, pallor, rash and wound.  Neurological: difficulty with vision occasionally. "something is missing, sometimes looks like panoramic vision"  Hematological: Negative for adenopathy. Does not bruise/bleed easily.  Psychiatric/Behavioral: Negative for behavioral problems, confusion, sleep disturbance, dysphoric mood, decreased concentration and agitation.       Objective:   Physical Exam BP 147/97 mmHg  Pulse 81  Temp(Src) 98.2 F (36.8 C) (Oral)  Wt 148 lb (67.132 kg) gen =  A  X O By 3 in NAD HEENT = [place],AT, hair loss,  poor dentition, no thrush Neck = supraclavicular LN measuring 2.5cm x 2.5cm firm nontender, non mobile Pulm = CTAB no W/C/R Chest wall = left sided chest wall wound vac no surrounding erythema Cors = nl S1,S2, no /g/m/r Skin = dry Neuro = CN 2-12 intact. ? left upper field cut but inconsistent response  Imaging Chest CT 2/3 1. 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 As questioned clinically, no abscess.Small but asymmetric left axillary lymph nodes. 2. 3 cm vegetation attached to the ascending aortic wall. This would be unusual for bland atherosclerotic thrombus, correlate for systemic infection/bacteremia. 3. Indeterminate liver and right adrenal masses. Recommend abdominal staging after  workup of #1.  Imaging:TEE - Left ventricle: Systolic function was normal. The estimated ejection fraction was in the range of 55% to 60%. Wall motion was normal; there were no regional wall motion abnormalities. - Aortic valve: There was trivial regurgitation. - Aorta: There was a mobile, pedunculated mass in the ascending aorta measuring. There are at least 2 heads on this mass, measuring 2.1 cm x 1.1 cm and 1.2 cm x 0.5 cm. Differential includes thrombus, vegetation, and malignancy. - Left atrium: No evidence of thrombus in the atrial cavity or appendage. No evidence of thrombus in the atrial cavity or appendage. - Right ventricle: The cavity size was normal. Wall thickness was normal. Systolic function was normal. - Right atrium: No evidence of thrombus in the atrial cavity or appendage. - Tricuspid valve: There was mild regurgitation. Peak RV-RA gradient (S): 22 mm Hg. - Pulmonic valve: There was trivial regurgitation.     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 She is focused with reconstruction breast surgery but unaware of upcoming recent appointments  - we have reached out to Dr. Soyla Murphy office to have appointment tomorrow for initial appt and further evaluation,  management - also established follow up with Dr. Marla Roe - she has follow up with Dr. Tammi Klippel at the end of the month  She is aware of these appointments  - Things to be addressed with oncology: - treatment options beyond radiation for brain mets - life expectancy - more education on breast cancer diagnosis since I am not convinced she understands prognosis/diagnosis  Wound infection = appears resolved. Will reach out to Dr. Marla Roe to see if patient would need to come back to our clinic for further management. Continues to need wound vac for wound healing  from surgery.   RTC PRN from ID standpoint

## 2015-07-04 ENCOUNTER — Encounter: Payer: Self-pay | Admitting: *Deleted

## 2015-07-04 ENCOUNTER — Ambulatory Visit (HOSPITAL_BASED_OUTPATIENT_CLINIC_OR_DEPARTMENT_OTHER): Payer: Medicare HMO | Admitting: Hematology and Oncology

## 2015-07-04 ENCOUNTER — Ambulatory Visit: Payer: Self-pay | Admitting: Surgery

## 2015-07-04 ENCOUNTER — Encounter: Payer: Self-pay | Admitting: Hematology and Oncology

## 2015-07-04 VITALS — BP 143/77 | HR 78 | Temp 97.9°F | Resp 18 | Ht 68.0 in | Wt 146.8 lb

## 2015-07-04 DIAGNOSIS — C50112 Malignant neoplasm of central portion of left female breast: Secondary | ICD-10-CM

## 2015-07-04 DIAGNOSIS — Z87891 Personal history of nicotine dependence: Secondary | ICD-10-CM | POA: Diagnosis not present

## 2015-07-04 DIAGNOSIS — C50812 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: Secondary | ICD-10-CM

## 2015-07-04 DIAGNOSIS — C7931 Secondary malignant neoplasm of brain: Secondary | ICD-10-CM | POA: Diagnosis not present

## 2015-07-04 MED ORDER — ONDANSETRON HCL 8 MG PO TABS: 8.0000 mg | ORAL_TABLET | Freq: Two times a day (BID) | ORAL | Status: DC | PRN

## 2015-07-04 MED ORDER — LORAZEPAM 0.5 MG PO TABS: 0.5000 mg | ORAL_TABLET | Freq: Four times a day (QID) | ORAL | Status: DC | PRN

## 2015-07-04 MED ORDER — PROCHLORPERAZINE MALEATE 10 MG PO TABS: 10.0000 mg | ORAL_TABLET | Freq: Four times a day (QID) | ORAL | Status: DC | PRN

## 2015-07-04 MED ORDER — LIDOCAINE-PRILOCAINE 2.5-2.5 % EX CREA: TOPICAL_CREAM | CUTANEOUS | Status: DC

## 2015-07-04 NOTE — Assessment & Plan Note (Addendum)
Left mastectomy: IDC grade 3, 14.7 cm, involves skin with ulceration, LVI present including dermal lymphatics, margins negative, isolated tumor cells 2/4 lymph nodes T4b N0i+M1 (stage 4) CT chest abdomen pelvis 06/03/2015 did not reveal any other evidence of distant metastatic disease Status post whole brain radiation therapy 06/13/2015- 06/29/2015  Recommendation: Palliative chemotherapy with Halaven days 1 and 8 every 3 weeks Chemotherapy counseling: I discussed with the patient the goal of chemotherapy is palliation and prolongation of life. We cannot cure stage IV breast cancer. I discussed the risks and benefits of chemotherapy including the risk of infection from cytopenias, anemia, neuropathy, fatigue, nausea and vomiting.  Our plan is to perform 3 cycles of chemotherapy then do a restaging scan and then finally 6 cycles of chemotherapy and do a PET scan.  Patient will need a port placement. I will request Dr. Molli Posey to place a port. Chemotherapy education Start chemotherapy after the port has been placed.

## 2015-07-04 NOTE — Progress Notes (Signed)
[facility] CONSULT NOTE  Patient Care Team: Pcp Not In System as PCP - General  CHIEF COMPLAINTS/PURPOSE OF CONSULTATION:  Stage IV breast cancer with brain metastases, necrotic fungating tumor left chest  HISTORY OF PRESENTING ILLNESS:  Robin Cantrell 70 y.o. female is here because of recent diagnosis of left breast cancer. She had a fungating mass in the left breast that has been growing in size for the past 6 months. She did not come to any medical doctor because she was praying over it and was hoping that it would go away on its own. Once she arrived at the hospital she had this extremely large fungating tumor that was biopsy-proven to be invasive ductal carcinoma. Posterior portal negative. She subsequently underwent a mastectomy for infection control and later undergone a skin graft placement by Dr. Marla Roe. She is still sore from this procedure and has a drain in place. For the brain metastases she completed palliative radiation therapy. She is here today to discuss further treatment options. She is in excellent spirits.  I reviewed her records extensively and collaborated the history with the patient.  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 ([place])   06/02/2015 Imaging Necrotic left breast mass, small asymmetric left x-ray lymph nodes, 3 cm vegetation attached to the ascending aortic wall tumor versus thrombus versus infection, indeterminate liver (10 mm) and right adrenal (13 mm) masses   06/02/2015 Surgery Left mastectomy: IDC grade 3, 14.7 cm, involves skin with ulceration, LVH present including dermal lymphatics, margins negative, isolated tumor cells 2/4 lymph nodes T4b N0i+M1 (stage 4)   06/07/2015 Imaging MRI brain: Numerous bilateral cerebral metastases (largest left frontal 2.5 x 1.5 cm; Rt: 1.5x1 cm Rt Occip lobe, mild edema associated with these lesions but no mass effect, single small cerebellar met (0.5cm),  3.2 cm meningioma   MEDICAL HISTORY:  Past Medical History  Diagnosis Date  . Stroke ([place]) 06/10/15    Acute Ischemic attack  . Cancer [facility])     mestatic breast    SURGICAL HISTORY: Past Surgical History  Procedure Laterality Date  . Ankle fracture surgery    . Total mastectomy Left 06/02/2015    Procedure: LEFT TOTAL MASTECTOMY;  Surgeon: Donnie Mesa, MD;  Location: [facility];  Service: General;  Laterality: Left;  . Tee without cardioversion N/A 06/06/2015    Procedure: TRANSESOPHAGEAL ECHOCARDIOGRAM (TEE);  Surgeon: Skeet Latch, MD;  Location: [facility];  Service: Cardiovascular;  Laterality: N/A;  . Incision and drainage of wound Left 06/08/2015    Procedure: IRRIGATION AND DEBRIDEMENT LEFT BREAST WOUND WITH A CELL PLACEMENT AND VAC;  Surgeon: Loel Lofty Dillingham, DO;  Location: [facility];  Service: Plastics;  Laterality: Left;  . Application of a-cell of chest/abdomen Left 06/08/2015    Procedure: WITH A CELL PLACEMENT;  Surgeon: Loel Lofty Dillingham, DO;  Location: [facility];  Service: Plastics;  Laterality: Left;  . Application of wound vac Left 06/08/2015    Procedure: AND VAC;  Surgeon: Loel Lofty Dillingham, DO;  Location: [facility];  Service: Plastics;  Laterality: Left;  . Skin split graft Left 06/26/2015    Procedure: SKIN GRAFT TO LEFT BREAST WITH ACELL AND VAC;  Surgeon: Loel Lofty Dillingham, DO;  Location: [facility];  Service: Plastics;  Laterality: Left;    SOCIAL HISTORY: Social History   Social History  . Marital Status: Single    Spouse Name: N/A  . Number of Children: N/A  .  Years of Education: N/A   Occupational History  . Not on file.   Social History Main Topics  . Smoking status: Former Smoker    Types: Cigarettes    Quit date: 06/04/1993  . Smokeless tobacco: Not on file  . Alcohol Use: No  . Drug Use: No  . Sexual Activity: Not on file   Other Topics Concern  . Not on file   Social History Narrative    FAMILY HISTORY: No family history of breast  cancer  ALLERGIES:  has No Known Allergies.  MEDICATIONS:  Current Outpatient Prescriptions  Medication Sig Dispense Refill  . feeding supplement, ENSURE ENLIVE, (ENSURE ENLIVE) LIQD Take 237 mLs by mouth 2 (two) times daily between meals. Or alternative formulary at facility 237 mL 12  . simvastatin (ZOCOR) 10 MG tablet Take 1 tablet (10 mg total) by mouth daily at 6 PM.     No current facility-administered medications for this visit.    REVIEW OF SYSTEMS:   Constitutional: Denies fevers, chills or abnormal night sweats Eyes: Denies blurriness of vision, double vision or watery eyes Ears, nose, mouth, throat, and face: Denies mucositis or sore throat Respiratory: Denies cough, dyspnea or wheezes Cardiovascular: Denies palpitation, chest discomfort or lower extremity swelling Gastrointestinal:  Denies nausea, heartburn or change in bowel habits Skin: Denies abnormal skin rashes Lymphatics: Denies new lymphadenopathy or easy bruising Neurological:Denies numbness, tingling or new weaknesses Behavioral/Psych: Mood is stable, no new changes  Breast: Recovering from recent surgery with mastectomy and skin graft to the left chest wall All other systems were reviewed with the patient and are negative.  PHYSICAL EXAMINATION: ECOG PERFORMANCE STATUS: 1 - Symptomatic but completely ambulatory  Filed Vitals:   07/04/15 1255  BP: 143/77  Pulse: 78  Temp: 97.9 F (36.6 C)  Resp: 18   Filed Weights   07/04/15 1255  Weight: 146 lb 12.8 oz (66.588 kg)    GENERAL:alert, no distress and comfortable SKIN: skin color, texture, turgor are normal, no rashes or significant lesions EYES: normal, conjunctiva are pink and non-injected, sclera clear OROPHARYNX:no exudate, no erythema and lips, buccal mucosa, and tongue normal  NECK: supple, thyroid normal size, non-tender, without nodularity LYMPH:  no palpable lymphadenopathy in the cervical, axillary or inguinal LUNGS: clear to auscultation  and percussion with normal breathing effort HEART: regular rate & rhythm and no murmurs and no lower extremity edema ABDOMEN:abdomen soft, non-tender and normal bowel sounds Musculoskeletal:no cyanosis of digits and no clubbing  PSYCH: alert & oriented x 3 with fluent speech NEURO: no focal motor/sensory deficits  LABORATORY DATA:  I have reviewed the data as listed Lab Results  Component Value Date   WBC 18.7* 06/26/2015   HGB 11.4* 06/26/2015   HCT 36.6 06/26/2015   MCV 82.4 06/26/2015   PLT 620* 06/26/2015   Lab Results  Component Value Date   NA 140 06/26/2015   K 3.9 06/26/2015   CL 101 06/26/2015   CO2 26 06/26/2015    RADIOGRAPHIC STUDIES: I have personally reviewed the radiological reports and agreed with the findings in the report.  ASSESSMENT AND PLAN:  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 ([place]) Left mastectomy: IDC grade 3, 14.7 cm, involves skin with ulceration, LVI present including dermal lymphatics, margins negative, isolated tumor cells 2/4 lymph nodes T4b N0i+M1 (stage 4) CT chest abdomen pelvis 06/03/2015 did not reveal any other evidence of distant metastatic disease Status post whole brain radiation therapy 06/13/2015- 06/29/2015  Recommendation: Palliative chemotherapy  with Hal[place] days 1 and 8 every 3 weeks Chemotherapy counseling: I discussed with the patient the goal of chemotherapy is palliation and prolongation of life. We cannot cure stage IV breast cancer. I discussed the risks and benefits of chemotherapy including the risk of infection from cytopenias, anemia, neuropathy, fatigue, nausea and vomiting.  Our plan is to perform 3 cycles of chemotherapy then do a restaging scan and then finally 6 cycles of chemotherapy and do a PET scan.  Patient will need a port placement. I will request Dr. Molli Posey to place a port. Chemotherapy education Start chemotherapy after the port has been placed.  All questions were answered. The  patient knows to call the clinic with any problems, questions or concerns.    Rulon Eisenmenger, MD 07/04/2015

## 2015-07-05 ENCOUNTER — Other Ambulatory Visit: Payer: Self-pay | Admitting: *Deleted

## 2015-07-05 NOTE — Progress Notes (Unsigned)
Faxed Rx's for anti nausea meds and port cream  to [facility] attn: Randell Patient. [phone]. Pt lives at this facility and has no outside pharmacy.

## 2015-07-07 ENCOUNTER — Encounter (HOSPITAL_COMMUNITY): Payer: Self-pay | Admitting: Plastic Surgery

## 2015-07-07 ENCOUNTER — Encounter: Payer: Self-pay | Admitting: Hematology and Oncology

## 2015-07-07 NOTE — Progress Notes (Signed)
Attempted to call pt to discuss financial assistance but was unable to get her on the phone or leave a message so I will meet w/ her on 07/11/15 when she comes for chemo ed class.

## 2015-07-09 NOTE — Progress Notes (Signed)
  Radiation Oncology         [phone]) [phone] ________________________________  Name: Cathelene Bilbao MRN: [id number]  Date: 06/29/2015  DOB: 1945-10-26  End of Treatment Note  DIAGNOSIS: 70 yo woman with multiple brain mets from locally advanced breast cancer  Indication for treatment:  Palliation       Radiation treatment dates:   06/13/2015-06/29/2015  Site/dose:   The whole brain was treated to 30 Gy in 10 fractions of 3 Gy  Beams/energy:   Right and Left radiation fields were treated using 6 MV X-rays with custom MLC collimation to shield the eyes and face.  The patient was immobilized with a thermoplastic mask and isocenter was verified with weekly port films.  Narrative: The patient tolerated radiation treatment relatively well.   Weight and vitals were stable. Denied pain. Denied headaches. Reported overall her vision has improved but, "still cropped."  Denied ringing in her ears. Wound vac attached to left chest wall functioned properly. Reported occasional dizziness. Reported fatigue. Denied nausea or vomiting. Took decadron 2 mg bid. No thrush noted. Reported constipation had resolved.  Plan: The patient has completed radiation treatment. The patient will return to radiation oncology clinic for routine followup in one month. I advised them to call or return sooner if they have any questions or concerns related to their recovery or treatment. ________________________________  Sheral Apley. Tammi Klippel, M.D.

## 2015-07-10 ENCOUNTER — Other Ambulatory Visit: Payer: Self-pay | Admitting: *Deleted

## 2015-07-10 ENCOUNTER — Telehealth: Payer: Self-pay | Admitting: Hematology and Oncology

## 2015-07-10 MED ORDER — CEFAZOLIN SODIUM-DEXTROSE 2-3 GM-% IV SOLR
2.0000 g | INTRAVENOUS | Status: DC
  Filled 2015-07-10: qty 50

## 2015-07-10 NOTE — Telephone Encounter (Signed)
appt made per 3/13 pof. Pt will get updated schedule at chemo edu class

## 2015-07-10 NOTE — Pre-Procedure Instructions (Signed)
    Milissa Swink  07/10/2015     No Pharmacies Listed   Your procedure is scheduled on Tuesday, July 11, 2015  Report to [facility] Admitting at 6:15 A.M.  Call this number if you have problems the morning of surgery:  [phone]   Remember:  Do not eat food or drink liquids after midnight.  Take these medicines the morning of surgery with A SIP OF WATER : Dexamethasone,  If needed: pain medication ( Tylenol OR Oxycodone), Nausea and vomiting medication ( Zofran, Compazine OR Ativan)  Stop taking Aspirin, vitamins, fish oil and herbal medications. Do not take any NSAIDs ie: Ibuprofen, Advil, Naproxen or any medication containing Aspirin; stop now.   Do not wear jewelry, make-up or nail polish.  Do not wear lotions, powders, or perfumes.  You may not wear deodorant.  Do not shave 48 hours prior to surgery.    Do not bring valuables to the hospital.  [facility] is not responsible for any belongings or valuables.  Contacts, dentures or bridgework may not be worn into surgery.  Leave your suitcase in the car.  After surgery it may be brought to your room.  For patients admitted to the hospital, discharge time will be determined by your treatment team.  Patients discharged the day of surgery will not be allowed to drive home.  Name and phone number of your driver:  Please read over the following fact sheets that you were given.  Please contact Allyne Gee, RN, BSN, with any questions or concerns regarding instructions @ [phone]

## 2015-07-10 NOTE — Progress Notes (Signed)
Anesthesia Chart Review:  Pt is a 70 year old female scheduled for port-a-cath insertion on 07/11/2015 with Dr. Georgette Dover.   Pt is a same day work up.   PMH includes:  Stroke (06/10/15), metastatic breast cancer, ascending aorta mass. Former smoker. BMI 22. S/p skin graft 06/26/15. S/p irrigation and debridement L breast wound 06/08/15. S/p L total mastectomy 06/02/15.   Pt will need labs DOS.   EKG 06/04/15: NSR.   TEE 06/06/15:  - Left ventricle: Systolic function was normal. The estimated ejection fraction was in the range of 55% to 60%. Wall motion was normal; there were no regional wall motion abnormalities. - Aortic valve: There was trivial regurgitation. - Aorta: There was a mobile, pedunculated mass in the ascending aorta measuring. There are at least 2 heads on this mass, measuring 2.1 cm x 1.1 cm and 1.2 cm x 0.5 cm. Differential includes thrombus, vegetation, and malignancy. (Note blood cultures did not identify any bacteria) - Left atrium: No evidence of thrombus in the atrial cavity or appendage. No evidence of thrombus in the atrial cavity or appendage. - Right ventricle: The cavity size was normal. Wall thickness was normal. Systolic function was normal. - Right atrium: No evidence of thrombus in the atrial cavity or appendage. - Tricuspid valve: There was mild regurgitation. Peak RV-RA gradient (S): 22 mm Hg. - Pulmonic valve: There was trivial regurgitation.  Reviewed case with Dr. Oletta Lamas. As pt has had 3 surgeries in last 42 days without issue, if labs acceptable DOS, I anticipate pt can proceed as scheduled.   Willeen Cass, FNP-BC [facility]/Anesthesiology Phone: [phone] 07/10/2015 3:36 PM

## 2015-07-10 NOTE — Progress Notes (Signed)
SDW-pre-op call completed by pt nurse, Dori. Please assess for anesthesia complications and Sleep Apnea on DOS. Nurse reviewed pt history and instructions via telephone and confirmed receipt of faxed instructions. Nurse verbalized understanding of all pre-op instructions. Nurse to fax copy of current MAR. Anesthesia asked to review pt history; see note.

## 2015-07-11 ENCOUNTER — Other Ambulatory Visit: Payer: Medicare HMO

## 2015-07-11 ENCOUNTER — Ambulatory Visit (HOSPITAL_COMMUNITY): Payer: Medicare HMO | Admitting: Emergency Medicine

## 2015-07-11 ENCOUNTER — Encounter (HOSPITAL_COMMUNITY): Payer: Self-pay

## 2015-07-11 ENCOUNTER — Ambulatory Visit (HOSPITAL_COMMUNITY): Payer: Medicare HMO

## 2015-07-11 ENCOUNTER — Ambulatory Visit (HOSPITAL_COMMUNITY)
Admission: RE | Admit: 2015-07-11 | Discharge: 2015-07-11 | Disposition: A | Discharge status: Home / Self Care | Payer: Medicare HMO | Source: Ambulatory Visit | Attending: Surgery | Admitting: Surgery

## 2015-07-11 ENCOUNTER — Encounter (HOSPITAL_COMMUNITY)
Admission: RE | Disposition: A | Discharge status: Home / Self Care | Payer: Self-pay | Source: Ambulatory Visit | Attending: Surgery

## 2015-07-11 DIAGNOSIS — Z8673 Personal history of transient ischemic attack (TIA), and cerebral infarction without residual deficits: Secondary | ICD-10-CM | POA: Insufficient documentation

## 2015-07-11 DIAGNOSIS — Z923 Personal history of irradiation: Secondary | ICD-10-CM | POA: Insufficient documentation

## 2015-07-11 DIAGNOSIS — C50919 Malignant neoplasm of unspecified site of unspecified female breast: Secondary | ICD-10-CM

## 2015-07-11 DIAGNOSIS — Z853 Personal history of malignant neoplasm of breast: Secondary | ICD-10-CM | POA: Insufficient documentation

## 2015-07-11 DIAGNOSIS — Z452 Encounter for adjustment and management of vascular access device: Secondary | ICD-10-CM

## 2015-07-11 DIAGNOSIS — Z9012 Acquired absence of left breast and nipple: Secondary | ICD-10-CM | POA: Diagnosis not present

## 2015-07-11 DIAGNOSIS — C7931 Secondary malignant neoplasm of brain: Secondary | ICD-10-CM | POA: Insufficient documentation

## 2015-07-11 DIAGNOSIS — Z79899 Other long term (current) drug therapy: Secondary | ICD-10-CM | POA: Insufficient documentation

## 2015-07-11 DIAGNOSIS — Z87891 Personal history of nicotine dependence: Secondary | ICD-10-CM | POA: Diagnosis not present

## 2015-07-11 HISTORY — PX: PORTACATH PLACEMENT: SHX2246

## 2015-07-11 SURGERY — INSERTION, TUNNELED CENTRAL VENOUS DEVICE, WITH PORT
Anesthesia: General | Site: Chest | Laterality: Right

## 2015-07-11 MED ORDER — SUCCINYLCHOLINE CHLORIDE 20 MG/ML IJ SOLN
INTRAMUSCULAR | Status: AC
  Filled 2015-07-11: qty 1

## 2015-07-11 MED ORDER — PHENYLEPHRINE HCL 10 MG/ML IJ SOLN
INTRAMUSCULAR | Status: DC | PRN
  Administered 2015-07-11 (×4): 80 ug via INTRAVENOUS

## 2015-07-11 MED ORDER — PHENYLEPHRINE 40 MCG/ML (10ML) SYRINGE FOR IV PUSH (FOR BLOOD PRESSURE SUPPORT)
PREFILLED_SYRINGE | INTRAVENOUS | Status: AC
  Filled 2015-07-11: qty 10

## 2015-07-11 MED ORDER — BUPIVACAINE-EPINEPHRINE 0.25% -1:200000 IJ SOLN
INTRAMUSCULAR | Status: DC | PRN
  Administered 2015-07-11: 10 mL

## 2015-07-11 MED ORDER — ONDANSETRON HCL 4 MG/2ML IJ SOLN
INTRAMUSCULAR | Status: DC | PRN
  Administered 2015-07-11: 4 mg via INTRAVENOUS

## 2015-07-11 MED ORDER — ROCURONIUM BROMIDE 50 MG/5ML IV SOLN
INTRAVENOUS | Status: AC
  Filled 2015-07-11: qty 1

## 2015-07-11 MED ORDER — SODIUM CHLORIDE 0.9 % IJ SOLN
INTRAMUSCULAR | Status: AC
  Filled 2015-07-11: qty 10

## 2015-07-11 MED ORDER — LIDOCAINE HCL (CARDIAC) 20 MG/ML IV SOLN
INTRAVENOUS | Status: AC
  Filled 2015-07-11: qty 5

## 2015-07-11 MED ORDER — EPHEDRINE SULFATE 50 MG/ML IJ SOLN
INTRAMUSCULAR | Status: AC
  Filled 2015-07-11: qty 1

## 2015-07-11 MED ORDER — HEPARIN SOD (PORK) LOCK FLUSH 100 UNIT/ML IV SOLN
INTRAVENOUS | Status: AC
  Filled 2015-07-11: qty 5

## 2015-07-11 MED ORDER — HEPARIN SOD (PORK) LOCK FLUSH 100 UNIT/ML IV SOLN
INTRAVENOUS | Status: DC | PRN
  Administered 2015-07-11: 500 [IU] via INTRAVENOUS

## 2015-07-11 MED ORDER — SODIUM CHLORIDE 0.9 % IV SOLN
INTRAVENOUS | Status: DC | PRN
  Administered 2015-07-11: 500 mL

## 2015-07-11 MED ORDER — FENTANYL CITRATE (PF) 250 MCG/5ML IJ SOLN
INTRAMUSCULAR | Status: AC
  Filled 2015-07-11: qty 5

## 2015-07-11 MED ORDER — LACTATED RINGERS IV SOLN
INTRAVENOUS | Status: DC | PRN
  Administered 2015-07-11: 08:00:00 via INTRAVENOUS

## 2015-07-11 MED ORDER — MIDAZOLAM HCL 2 MG/2ML IJ SOLN
INTRAMUSCULAR | Status: AC
  Filled 2015-07-11: qty 2

## 2015-07-11 MED ORDER — BUPIVACAINE-EPINEPHRINE (PF) 0.25% -1:200000 IJ SOLN
INTRAMUSCULAR | Status: AC
  Filled 2015-07-11: qty 30

## 2015-07-11 MED ORDER — CHLORHEXIDINE GLUCONATE 4 % EX LIQD: 1.0000 "application " | Freq: Once | CUTANEOUS | Status: DC

## 2015-07-11 MED ORDER — PROPOFOL 10 MG/ML IV BOLUS
INTRAVENOUS | Status: DC | PRN
  Administered 2015-07-11: 150 mg via INTRAVENOUS

## 2015-07-11 MED ORDER — DEXAMETHASONE SODIUM PHOSPHATE 4 MG/ML IJ SOLN
INTRAMUSCULAR | Status: DC | PRN
  Administered 2015-07-11: 4 mg via INTRAVENOUS

## 2015-07-11 MED ORDER — LIDOCAINE HCL (CARDIAC) 20 MG/ML IV SOLN
INTRAVENOUS | Status: DC | PRN
  Administered 2015-07-11: 60 mg via INTRAVENOUS

## 2015-07-11 MED ORDER — PROPOFOL 10 MG/ML IV BOLUS
INTRAVENOUS | Status: AC
  Filled 2015-07-11: qty 20

## 2015-07-11 MED ORDER — FENTANYL CITRATE (PF) 250 MCG/5ML IJ SOLN
INTRAMUSCULAR | Status: DC | PRN
  Administered 2015-07-11 (×2): 25 ug via INTRAVENOUS

## 2015-07-11 SURGICAL SUPPLY — 50 items
BAG DECANTER FOR FLEXI CONT (MISCELLANEOUS) ×3 IMPLANT
BENZOIN TINCTURE PRP APPL 2/3 (GAUZE/BANDAGES/DRESSINGS) ×3 IMPLANT
BLADE SURG 11 STRL SS (BLADE) ×3 IMPLANT
BLADE SURG 15 STRL LF DISP TIS (BLADE) ×1 IMPLANT
BLADE SURG 15 STRL SS (BLADE) ×2
CHLORAPREP W/TINT 10.5 ML (MISCELLANEOUS) ×3 IMPLANT
CLOSURE WOUND 1/2 X4 (GAUZE/BANDAGES/DRESSINGS) ×1
COVER SURGICAL LIGHT HANDLE (MISCELLANEOUS) ×3 IMPLANT
COVER TRANSDUCER ULTRASND GEL (DRAPE) IMPLANT
CRADLE DONUT ADULT HEAD (MISCELLANEOUS) ×3 IMPLANT
DRAPE C-ARM 42X72 X-RAY (DRAPES) ×3 IMPLANT
DRAPE LAPAROSCOPIC ABDOMINAL (DRAPES) ×3 IMPLANT
DRAPE UTILITY XL STRL (DRAPES) ×3 IMPLANT
DRSG TEGADERM 4X4.75 (GAUZE/BANDAGES/DRESSINGS) ×3 IMPLANT
ELECT CAUTERY BLADE 6.4 (BLADE) ×3 IMPLANT
ELECT REM PT RETURN 9FT ADLT (ELECTROSURGICAL) ×3
ELECTRODE REM PT RTRN 9FT ADLT (ELECTROSURGICAL) ×1 IMPLANT
GAUZE SPONGE 2X2 8PLY STRL LF (GAUZE/BANDAGES/DRESSINGS) ×1 IMPLANT
GAUZE SPONGE 4X4 16PLY XRAY LF (GAUZE/BANDAGES/DRESSINGS) ×3 IMPLANT
GEL ULTRASOUND 20GR AQUASONIC (MISCELLANEOUS) IMPLANT
GLOVE BIO SURGEON STRL SZ7 (GLOVE) ×6 IMPLANT
GLOVE BIOGEL PI IND STRL 7.0 (GLOVE) ×1 IMPLANT
GLOVE BIOGEL PI IND STRL 7.5 (GLOVE) ×1 IMPLANT
GLOVE BIOGEL PI INDICATOR 7.0 (GLOVE) ×2
GLOVE BIOGEL PI INDICATOR 7.5 (GLOVE) ×2
GOWN STRL REUS W/ TWL LRG LVL3 (GOWN DISPOSABLE) ×2 IMPLANT
GOWN STRL REUS W/TWL LRG LVL3 (GOWN DISPOSABLE) ×4
INTRODUCER COOK 11FR (CATHETERS) IMPLANT
KIT BASIN OR (CUSTOM PROCEDURE TRAY) ×3 IMPLANT
KIT PORT POWER 8FR ISP CVUE (Catheter) ×3 IMPLANT
KIT ROOM TURNOVER OR (KITS) ×3 IMPLANT
NEEDLE HYPO 25GX1X1/2 BEV (NEEDLE) ×3 IMPLANT
NS IRRIG 1000ML POUR BTL (IV SOLUTION) ×3 IMPLANT
PACK SURGICAL SETUP 50X90 (CUSTOM PROCEDURE TRAY) ×3 IMPLANT
PAD ARMBOARD 7.5X6 YLW CONV (MISCELLANEOUS) ×3 IMPLANT
PENCIL BUTTON HOLSTER BLD 10FT (ELECTRODE) ×3 IMPLANT
SET INTRODUCER 12FR PACEMAKER (SHEATH) IMPLANT
SET SHEATH INTRODUCER 10FR (MISCELLANEOUS) IMPLANT
SHEATH COOK PEEL AWAY SET 9F (SHEATH) IMPLANT
SPONGE GAUZE 2X2 STER 10/PKG (GAUZE/BANDAGES/DRESSINGS) ×2
STRIP CLOSURE SKIN 1/2X4 (GAUZE/BANDAGES/DRESSINGS) ×2 IMPLANT
SUT MNCRL AB 4-0 PS2 18 (SUTURE) ×3 IMPLANT
SUT PROLENE 2 0 SH DA (SUTURE) ×3 IMPLANT
SUT VIC AB 3-0 SH 27 (SUTURE) ×2
SUT VIC AB 3-0 SH 27X BRD (SUTURE) ×1 IMPLANT
SYR 20ML ECCENTRIC (SYRINGE) ×6 IMPLANT
SYR 5ML LUER SLIP (SYRINGE) ×3 IMPLANT
SYR CONTROL 10ML LL (SYRINGE) ×3 IMPLANT
TOWEL OR 17X24 6PK STRL BLUE (TOWEL DISPOSABLE) ×3 IMPLANT
TOWEL OR 17X26 10 PK STRL BLUE (TOWEL DISPOSABLE) ×3 IMPLANT

## 2015-07-11 NOTE — Transfer of Care (Signed)
Immediate Anesthesia Transfer of Care Note  Patient: Robin Cantrell  Procedure(s) Performed: Procedure(s): INSERTION PORT-A-CATH (Right)  Patient Location: PACU  Anesthesia Type:General  Level of Consciousness: awake, alert , patient cooperative and responds to stimulation  Airway & Oxygen Therapy: Patient Spontanous Breathing and Patient connected to nasal cannula oxygen  Post-op Assessment: Report given to RN, Post -op Vital signs reviewed and stable and Patient moving all extremities X 4  Post vital signs: Reviewed and stable  Last Vitals:  Filed Vitals:   07/11/15 0651 07/11/15 0854  BP: 138/84 145/97  Pulse: 73 79  Temp: 36.8 C 36.4 C  Resp: 18 11    Complications: No apparent anesthesia complications

## 2015-07-11 NOTE — Discharge Instructions (Signed)
    PORT-A-CATH: POST OP INSTRUCTIONS  Always review your discharge instruction sheet given to you by the facility where your surgery was performed.   1. A prescription for pain medication may be given to you upon discharge. Take your pain medication as prescribed, if needed. If narcotic pain medicine is not needed, then you make take acetaminophen (Tylenol) or ibuprofen (Advil) as needed.  2. Take your usually prescribed medications unless otherwise directed. 3. If you need a refill on your pain medication, please contact our office. All narcotic pain medicine now requires a paper prescription.  Phoned in and fax refills are no longer allowed by law.  Prescriptions will not be filled after 5 pm or on weekends.  4. You should follow a light diet for the remainder of the day after your procedure. 5. Most patients will experience some mild swelling and/or bruising in the area of the incision. It may take several days to resolve. 6. It is common to experience some constipation if taking pain medication after surgery. Increasing fluid intake and taking a stool softener (such as Colace) will usually help or prevent this problem from occurring. A mild laxative (Milk of Magnesia or Miralax) should be taken according to package directions if there are no bowel movements after 48 hours.  7. Unless discharge instructions indicate otherwise, you may remove your bandages 48 hours after surgery, and you may shower at that time. You may have steri-strips (small white skin tapes) in place directly over the incision.  These strips should be left on the skin for 7-10 days.  If your surgeon used Dermabond (skin glue) on the incision, you may shower in 24 hours.  The glue will flake off over the next 2-3 weeks.  8. If your port is left accessed at the end of surgery (needle left in port), the dressing cannot get wet and should only by changed by a healthcare professional. When the port is no longer accessed (when the  needle has been removed), follow step 7.   9. ACTIVITIES:  Limit activity involving your arms for the next 72 hours. Do no strenuous exercise or activity for 1 week. You may drive when you are no longer taking prescription pain medication, you can comfortably wear a seatbelt, and you can maneuver your car. 10.You may need to see your doctor in the office for a follow-up appointment.  Please       check with your doctor.  11.When you receive a new Port-a-Cath, you will get a product guide and        ID card.  Please keep them in case you need them.  WHEN TO CALL YOUR DOCTOR ([phone]): 1. Fever over 101.0 2. Chills 3. Continued bleeding from incision 4. Increased redness and tenderness at the site 5. Shortness of breath, difficulty breathing   The clinic staff is available to answer your questions during regular business hours. Please don't hesitate to call and ask to speak to one of the nurses or medical assistants for clinical concerns. If you have a medical emergency, go to the nearest emergency room or call 911.  A surgeon from [facility] is always on call at the hospital.     For further information, please visit [email or web address]      

## 2015-07-11 NOTE — Anesthesia Procedure Notes (Signed)
Procedure Name: LMA Insertion Date/Time: 07/11/2015 7:50 AM Performed by: Julian Reil Pre-anesthesia Checklist: Patient identified, Emergency Drugs available, Suction available and Patient being monitored Patient Re-evaluated:Patient Re-evaluated prior to inductionOxygen Delivery Method: Circle system utilized Preoxygenation: Pre-oxygenation with 100% oxygen Intubation Type: IV induction Ventilation: Mask ventilation without difficulty LMA: LMA inserted LMA Size: 4.0 Tube type: Oral Number of attempts: 1 Placement Confirmation: positive ETCO2 and breath sounds checked- equal and bilateral Tube secured with: Tape Dental Injury: Teeth and Oropharynx as per pre-operative assessment  Comments: Very poor dentition with multiple loose teeth. Teeth as preop after LMA insertion.

## 2015-07-11 NOTE — Op Note (Signed)
Preop diagnosis: Metastatic left breast cancer Postop diagnosis: Same Procedure performed: Right subclavian vein port placement Surgeon:Minaal Struckman K. Anesthesia: Gen. 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 She underwent emergent mastectomy. She was discovered to have brain metastases. She has completed a course of radiation therapy to her brain. She presents now for port placement for chemotherapy.  Description of procedure: The patient is brought to the operating room and placed in a supine position on the operating room table. After an adequate level of general anesthesia was obtained a roll was placed behind her shoulders to extend her neck. Her arms were tucked at her sides. Her left chest was quickly examined. The skin graft seems to be doing quite well with 100% take. Her right chest and neck were prepped with ChloraPrep and draped in sterile fashion.  A timeout was taken to ensure the proper patient and proper procedure. The patient was positioned in Trendelenburg. We infiltrated the area below the right clavicle with 0.25% Marcaine with epinephrine. An 18-gauge needle was used to cannulate the subclavian vein on the first pass. Good nonpulsatile blood flow was noted. The guidewire was advanced under fluoroscopic guidance and was seen heading down the right side of the mediastinum and the superior vena cava. The needle was removed.  We created a subcutaneous pocket lower on the right chest wall. We excised some subcutaneous fat. A subtenons tunnel was created from the initial insertion site to the subcutaneous pocket. An 8 French ClearView port was assembled and was tunneled from the pocket to the insertion site. The port was secured inside the pocket with 2 separate 2-0 Prolene sutures. The catheter was cut to the appropriate length using fluoroscopic guidance. We passed the dilator and  breakaway sheath over the guidewire under fluoroscopic guidance. The wire and dilator were removed. The catheter was advanced through the breakaway sheath which was then removed. The catheter seem to be free of any kinks upon examination with the C-arm. We were able to aspirate blood easily and flushed with heparinized saline easily. The wounds were closed with 3-0 Vicryl and 4-0 Monocryl. We instilled concentrated heparin solution into the port. Steri-Strips and clean dressings were applied. The patient was then extubated and brought to recovery room in stable condition. All sponge, initially, and needle counts are correct.  Imogene Burn. Georgette Dover, MD, [facility]  General/ Trauma Surgery  07/11/2015 8:52 AM

## 2015-07-11 NOTE — Anesthesia Preprocedure Evaluation (Signed)
Anesthesia Evaluation  Patient identified by MRN, date of birth, ID band Patient awake    Reviewed: Allergy & Precautions, NPO status , Patient's Chart, lab work & pertinent test results  Airway Mallampati: II  TM Distance: >3 FB Neck ROM: Full    Dental no notable dental hx. (+) Poor Dentition, Missing, Dental Advisory Given,    Pulmonary former smoker,    Pulmonary exam normal breath sounds clear to auscultation       Cardiovascular Normal cardiovascular exam Rhythm:Regular Rate:Normal   Left ventricle: Systolic function was normal. The estimated ejection fraction was in the range of 55% to 60%. Wall motion was normal; there were no regional wall motion abnormalities. - Aortic valve: There was trivial regurgitation.      Neuro/Psych CVA, Residual Symptoms negative psych ROS   GI/Hepatic negative GI ROS, Neg liver ROS,   Endo/Other  negative endocrine ROS  Renal/GU Renal disease  negative genitourinary   Musculoskeletal negative musculoskeletal ROS (+)   Abdominal   Peds negative pediatric ROS (+)  Hematology negative hematology ROS (+)   Anesthesia Other Findings   Reproductive/Obstetrics negative OB ROS                             Anesthesia Physical  Anesthesia Plan  ASA: III  Anesthesia Plan: General   Post-op Pain Management:    Induction: Intravenous  Airway Management Planned: LMA  Additional Equipment:   Intra-op Plan:   Post-operative Plan: Extubation in OR  Informed Consent: I have reviewed the patients History and Physical, chart, labs and discussed the procedure including the risks, benefits and alternatives for the proposed anesthesia with the patient or authorized representative who has indicated his/her understanding and acceptance.   Dental advisory given  Plan Discussed with: CRNA and Surgeon  Anesthesia Plan Comments:         Anesthesia Quick  Evaluation

## 2015-07-11 NOTE — Anesthesia Postprocedure Evaluation (Signed)
Anesthesia Post Note  Patient: Robin Cantrell  Procedure(s) Performed: Procedure(s) (LRB): INSERTION PORT-A-CATH (Right)  Patient location during evaluation: PACU Anesthesia Type: General Level of consciousness: awake and alert Pain management: pain level controlled Vital Signs Assessment: post-procedure vital signs reviewed and stable Respiratory status: spontaneous breathing, nonlabored ventilation, respiratory function stable and patient connected to nasal cannula oxygen Cardiovascular status: blood pressure returned to baseline and stable Postop Assessment: no signs of nausea or vomiting Anesthetic complications: no    Last Vitals:  Filed Vitals:   07/11/15 0854 07/11/15 0910  BP: 145/97 138/113  Pulse: 79   Temp: 36.4 C   Resp: 11 13    Last Pain: There were no vitals filed for this visit.               Zenaida Deed

## 2015-07-11 NOTE — H&P (Signed)
Robin Cantrell is an 70 y.o. female.   Chief Complaint:  Metastatic left breast cancer HPI: s/p emergent left breast cancer on 06/02/15 for locally advanced breast cancer, now with brain metastases.  She has completed her course of radiation and needs port placement for chemotherapy.  Skin graft has been applied to left chest wound.    Past Medical History  Diagnosis Date  . Stroke ([place]) 06/10/15    Acute Ischemic attack  . Cancer [facility])     mestatic breast    Past Surgical History  Procedure Laterality Date  . Ankle fracture surgery    . Total mastectomy Left 06/02/2015    Procedure: LEFT TOTAL MASTECTOMY;  Surgeon: Robin Mesa, MD;  Location: [place];  Service: General;  Laterality: Left;  . Tee without cardioversion N/A 06/06/2015    Procedure: TRANSESOPHAGEAL ECHOCARDIOGRAM (TEE);  Surgeon: Robin Latch, MD;  Location: [place];  Service: Cardiovascular;  Laterality: N/A;  . Incision and drainage of wound Left 06/08/2015    Procedure: IRRIGATION AND DEBRIDEMENT LEFT BREAST WOUND WITH A CELL PLACEMENT AND VAC;  Surgeon: Robin Lofty Dillingham, DO;  Location: [place];  Service: Plastics;  Laterality: Left;  . Application of a-cell of chest/abdomen Left 06/08/2015    Procedure: WITH A CELL PLACEMENT;  Surgeon: Robin Lofty Dillingham, DO;  Location: [place];  Service: Plastics;  Laterality: Left;  . Application of wound vac Left 06/08/2015    Procedure: AND VAC;  Surgeon: Robin Lofty Dillingham, DO;  Location: [place];  Service: Plastics;  Laterality: Left;  . Skin split graft Left 06/26/2015    Procedure: SKIN GRAFT TO LEFT BREAST WITH ACELL AND VAC;  Surgeon: Robin Lofty Dillingham, DO;  Location: [place];  Service: Plastics;  Laterality: Left;    No family history on file. Social History:  reports that she quit smoking about 22 years ago. Her smoking use included Cigarettes. She does not have any smokeless tobacco history on file. She reports that she does not drink alcohol or use illicit  drugs.  Allergies: No Known Allergies  Medications Prior to Admission  Medication Sig Dispense Refill  . acetaminophen (TYLENOL) 325 MG tablet Take 650 mg by mouth every 6 (six) hours as needed for mild pain.    Marland Kitchen dexamethasone (DECADRON) 2 MG tablet Take 2 mg by mouth daily.    . diphenhydrAMINE (BENADRYL) 25 mg capsule Take 25 mg by mouth at bedtime as needed for sleep.    . feeding supplement, ENSURE ENLIVE, (ENSURE ENLIVE) LIQD Take 237 mLs by mouth 2 (two) times daily between meals. Or alternative formulary at facility 237 mL 12  . hydrOXYzine (ATARAX/VISTARIL) 10 MG tablet Take 10 mg by mouth 3 (three) times daily as needed.    Marland Kitchen LORazepam (ATIVAN) 0.5 MG tablet Take 1 tablet (0.5 mg total) by mouth every 6 (six) hours as needed (Nausea or vomiting). 30 tablet 0  . Lubricants (K-Y) LIQD Apply 1 application topically every evening.    Marland Kitchen oxyCODONE (OXY IR/ROXICODONE) 5 MG immediate release tablet Take 5 mg by mouth every 4 (four) hours as needed for severe pain.    Marland Kitchen senna-docusate (SENOKOT-S) 8.6-50 MG tablet Take 2 tablets by mouth daily.    . simvastatin (ZOCOR) 10 MG tablet Take 1 tablet (10 mg total) by mouth daily at 6 PM.    . lidocaine-prilocaine (EMLA) cream Apply to affected area once (Patient not taking: Reported on 07/10/2015) 30 g 3  . ondansetron (ZOFRAN) 8 MG tablet Take 1  tablet (8 mg total) by mouth 2 (two) times daily as needed (Nausea or vomiting). 30 tablet 1  . prochlorperazine (COMPAZINE) 10 MG tablet Take 1 tablet (10 mg total) by mouth every 6 (six) hours as needed (Nausea or vomiting). 30 tablet 1    No results found for this or any previous visit (from the past 48 hour(s)). No results found.  ROS Constitutional: Denies fevers, chills or abnormal night sweats Eyes: Denies blurriness of vision, double vision or watery eyes Ears, nose, mouth, throat, and face: Denies mucositis or sore throat Respiratory: Denies cough, dyspnea or wheezes Cardiovascular: Denies  palpitation, chest discomfort or lower extremity swelling Gastrointestinal: Denies nausea, heartburn or change in bowel habits Skin: Denies abnormal skin rashes Lymphatics: Denies new lymphadenopathy or easy bruising Neurological:Denies numbness, tingling or new weaknesses Behavioral/Psych: Mood is stable, no new changes  Breast: Recovering from recent surgery with mastectomy and skin graft to the left chest wall All other systems were reviewed with the patient and are negative. Blood pressure 138/84, pulse 73, temperature 98.2 F (36.8 C), temperature source Oral, resp. rate 18, weight 66.225 kg (146 lb), SpO2 100 %. Physical Exam  GENERAL:alert, no distress and comfortable SKIN: skin color, texture, turgor are normal, no rashes or significant lesions EYES: normal, conjunctiva are pink and non-injected, sclera clear OROPHARYNX:no exudate, no erythema and lips, buccal mucosa, and tongue normal  NECK: supple, thyroid normal size, non-tender, without nodularity LYMPH: no palpable lymphadenopathy in the cervical, axillary or inguinal CHEST:  Left chest wound with skin graft LUNGS: clear to auscultation and percussion with normal breathing effort HEART: regular rate & rhythm and no murmurs and no lower extremity edema ABDOMEN:abdomen soft, non-tender and normal bowel sounds Musculoskeletal:no cyanosis of digits and no clubbing  PSYCH: alert & oriented x 3 with fluent speech NEURO: no focal motor/sensory deficits Assessment/Plan Left breast invasive ductal carcinoma with ulceration, brain metastases  Right subclavian vein port - The surgical procedure has been discussed with the patient.  Potential risks, benefits, alternative treatments, and expected outcomes have been explained.  All of the patient's questions at this time have been answered.  The likelihood of reaching the patient's treatment goal is good.  The patient understand the proposed surgical procedure and wishes to  proceed.   Robin Cantrell., MD 07/11/2015, 7:22 AM

## 2015-07-12 ENCOUNTER — Encounter (HOSPITAL_COMMUNITY): Payer: Self-pay | Admitting: Surgery

## 2015-07-17 ENCOUNTER — Telehealth: Payer: Self-pay | Admitting: *Deleted

## 2015-07-17 DIAGNOSIS — C50812 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: Secondary | ICD-10-CM

## 2015-07-17 DIAGNOSIS — C7931 Secondary malignant neoplasm of brain: Secondary | ICD-10-CM

## 2015-07-17 NOTE — Telephone Encounter (Signed)
Writer returned call to [facility].  Per Jenny Reichmann, continuing MD support for home health needed.  They are providing wound care to surgical sites and family is requesting OT.  Jenny Reichmann reports pt has had a stroke and needs OT for left sided weakness.  Verbal order for Home Health/OT provided.  I let Jenny Reichmann know I would check with Dr. Lindi Adie on dexamethasone and call her back.  She voiced understanding.

## 2015-07-17 NOTE — Telephone Encounter (Signed)
Writer called RN Jenny Reichmann and let her know, per Dr. Lindi Adie, he would like patient to taper off the dexamethasone.  Reduce to 1 mg/daily for 2 weeks, than stop.  Jenny Reichmann RN will advise and coordinate with pt.

## 2015-07-17 NOTE — Telephone Encounter (Signed)
Received call from [facility] stating that they received referral from facility on this pt & spoke with on-call MD over the weekend regarding dexamethasone 2 mg bid & informed to cont but speak with Dr Lindi Adie on Monday. This pt does not have a PCP & they need an order for home health which they think she needs.  She has a chest wound & a donor site wound which are looking good.  They also want to know how long she will be on decadron per on-call physician question.  Informed of appts tomorrow & she will inform family. She can be reached at [phone]. Message routed to Dr Lorinda Creed

## 2015-07-18 ENCOUNTER — Encounter: Payer: Self-pay | Admitting: Hematology and Oncology

## 2015-07-18 ENCOUNTER — Telehealth: Payer: Self-pay | Admitting: Hematology and Oncology

## 2015-07-18 ENCOUNTER — Ambulatory Visit (HOSPITAL_BASED_OUTPATIENT_CLINIC_OR_DEPARTMENT_OTHER): Payer: Medicare HMO

## 2015-07-18 ENCOUNTER — Other Ambulatory Visit: Payer: Medicare HMO

## 2015-07-18 ENCOUNTER — Encounter: Payer: Self-pay | Admitting: *Deleted

## 2015-07-18 ENCOUNTER — Other Ambulatory Visit (HOSPITAL_BASED_OUTPATIENT_CLINIC_OR_DEPARTMENT_OTHER): Payer: Medicare HMO

## 2015-07-18 ENCOUNTER — Ambulatory Visit (HOSPITAL_BASED_OUTPATIENT_CLINIC_OR_DEPARTMENT_OTHER): Payer: Medicare HMO | Admitting: Hematology and Oncology

## 2015-07-18 VITALS — BP 147/96 | HR 87 | Temp 98.0°F | Resp 17 | Wt 149.2 lb

## 2015-07-18 DIAGNOSIS — C7931 Secondary malignant neoplasm of brain: Secondary | ICD-10-CM

## 2015-07-18 DIAGNOSIS — Z5111 Encounter for antineoplastic chemotherapy: Secondary | ICD-10-CM

## 2015-07-18 DIAGNOSIS — C50812 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: Secondary | ICD-10-CM

## 2015-07-18 LAB — COMPREHENSIVE METABOLIC PANEL
ALT: 22 U/L (ref 0–55)
ANION GAP: 9 meq/L (ref 3–11)
AST: 23 U/L (ref 5–34)
Albumin: 3.6 g/dL (ref 3.5–5.0)
Alkaline Phosphatase: 60 U/L (ref 40–150)
BILIRUBIN TOTAL: 0.32 mg/dL (ref 0.20–1.20)
BUN: 35.8 mg/dL — ABNORMAL HIGH (ref 7.0–26.0)
CO2: 25 meq/L (ref 22–29)
Calcium: 9.6 mg/dL (ref 8.4–10.4)
Chloride: 104 mEq/L (ref 98–109)
Creatinine: 1.1 mg/dL (ref 0.6–1.1)
EGFR: 60 mL/min/{1.73_m2} — AB (ref >90)
GLUCOSE: 78 mg/dL (ref 70–140)
POTASSIUM: 4.5 meq/L (ref 3.5–5.1)
SODIUM: 137 meq/L (ref 136–145)
TOTAL PROTEIN: 8.3 g/dL (ref 6.4–8.3)

## 2015-07-18 LAB — CBC WITH DIFFERENTIAL/PLATELET
BASO%: 0.5 % (ref 0.0–2.0)
BASOS ABS: 0.1 10*3/uL (ref 0.0–0.1)
EOS ABS: 0.1 10*3/uL (ref 0.0–0.5)
EOS%: 1.1 % (ref 0.0–7.0)
HCT: 36.4 % (ref 34.8–46.6)
HGB: 11.7 g/dL (ref 11.6–15.9)
LYMPH%: 19.7 % (ref 14.0–49.7)
MCH: 26.3 pg (ref 25.1–34.0)
MCHC: 32.2 g/dL (ref 31.5–36.0)
MCV: 81.7 fL (ref 79.5–101.0)
MONO#: 0.7 10*3/uL (ref 0.1–0.9)
MONO%: 6.6 % (ref 0.0–14.0)
NEUT%: 72.1 % (ref 38.4–76.8)
NEUTROS ABS: 7.4 10*3/uL — AB (ref 1.5–6.5)
PLATELETS: 366 10*3/uL (ref 145–400)
RBC: 4.45 10*6/uL (ref 3.70–5.45)
RDW: 21.2 % — ABNORMAL HIGH (ref 11.2–14.5)
WBC: 10.3 10*3/uL (ref 3.9–10.3)
lymph#: 2 10*3/uL (ref 0.9–3.3)

## 2015-07-18 MED ORDER — ERIBULIN MESYLATE CHEMO INJECTION 1 MG/2ML
1.4000 mg/m2 | Freq: Once | INTRAVENOUS | Status: AC
  Administered 2015-07-18: 2.5 mg via INTRAVENOUS
  Filled 2015-07-18: qty 5

## 2015-07-18 MED ORDER — HEPARIN SOD (PORK) LOCK FLUSH 100 UNIT/ML IV SOLN
500.0000 [IU] | Freq: Once | INTRAVENOUS | Status: AC | PRN
  Administered 2015-07-18: 500 [IU]
  Filled 2015-07-18: qty 5

## 2015-07-18 MED ORDER — PROCHLORPERAZINE MALEATE 10 MG PO TABS
ORAL_TABLET | ORAL | Status: AC
  Filled 2015-07-18: qty 1

## 2015-07-18 MED ORDER — SODIUM CHLORIDE 0.9 % IV SOLN
Freq: Once | INTRAVENOUS | Status: AC
  Administered 2015-07-18: 15:00:00 via INTRAVENOUS

## 2015-07-18 MED ORDER — LIDOCAINE-PRILOCAINE 2.5-2.5 % EX CREA: TOPICAL_CREAM | CUTANEOUS | Status: DC

## 2015-07-18 MED ORDER — SODIUM CHLORIDE 0.9% FLUSH
10.0000 mL | INTRAVENOUS | Status: DC | PRN
  Administered 2015-07-18: 10 mL
  Filled 2015-07-18: qty 10

## 2015-07-18 MED ORDER — PROCHLORPERAZINE MALEATE 10 MG PO TABS
10.0000 mg | ORAL_TABLET | Freq: Once | ORAL | Status: AC
  Administered 2015-07-18: 10 mg via ORAL

## 2015-07-18 NOTE — Assessment & Plan Note (Signed)
Left mastectomy: IDC grade 3, 14.7 cm, involves skin with ulceration, LVI present including dermal lymphatics, margins negative, isolated tumor cells 2/4 lymph nodes T4b N0i+M1 (stage 4) CT chest abdomen pelvis 06/03/2015 did not reveal any other evidence of distant metastatic disease Status post whole brain radiation therapy 06/13/2015- 06/29/2015  Current treatment: Palliative chemotherapy with [place] days 1 and 8 every 3 weeks today's cycle 1 day 1 Antiemetics were reviewed Labs were reviewed Port appears to be healing well Return to clinic in 1 week for toxicity check and for cycle 1 day 8.

## 2015-07-18 NOTE — Progress Notes (Signed)
Met w/ pt regarding copay assistance.  Pt has 2 insurances so financial assistance is not needed.  I offered the [place] went over what it covers and gave her an expense sheet.  She will submit her proof of income to see if she qualifies.

## 2015-07-18 NOTE — Telephone Encounter (Signed)
appt made and avs printed °

## 2015-07-18 NOTE — Patient Instructions (Signed)
Eribulin solution for injection  What is this medicine?  ERIBULIN (er e bu lin) is a chemotherapy drug. It is used to treat breast cancer and liposarcoma.  This medicine may be used for other purposes; ask your health care provider or pharmacist if you have questions.  What should I tell my health care provider before I take this medicine?  They need to know if you have any of these conditions:  -heart disease  -history of irregular heartbeat  -kidney disease  -liver disease  -low blood counts, like low white cell, platelet, or red cell counts  -low levels of potassium or magnesium in the blood  -an unusual or allergic reaction to eribulin, other medicines, foods, dyes, or preservatives  -pregnant or trying to get pregnant  -breast-feeding  How should I use this medicine?  This medicine is for infusion into a vein. It is given by a health care professional in a hospital or clinic setting.  Talk to your pediatrician regarding the use of this medicine in children. Special care may be needed.  Overdosage: If you think you have taken too much of this medicine contact a poison control center or emergency room at once.  NOTE: This medicine is only for you. Do not share this medicine with others.  What if I miss a dose?  It is important not to miss your dose. Call your doctor or health care professional if you are unable to keep an appointment.  What may interact with this medicine?  Do not take this medicine with any of the following medications:  -amiodarone  -astemizole  -arsenic trioxide  -bepridil  -bretylium  -chloroquine  -chlorpromazine  -cisapride  -clarithromycin  -dextromethorphan,  quinidine  -disopyramide  -dofetilide  -droperidol  -dronedarone  -erythromycin  -grepafloxacin  -halofantrine  -haloperidol  -ibutilide  -levomethadyl  -mesoridazine  -methadone  -pentamidine  -procainamide  -quinidine  -pimozide  -posaconazole  -probucol  -propafenone  -saquinavir  -sotalol  -sparfloxacin  -terfenadine  -thioridazine  -troleandomycin  -ziprasidone  This list may not describe all possible interactions. Give your health care provider a list of all the medicines, herbs, non-prescription drugs, or dietary supplements you use. Also tell them if you smoke, drink alcohol, or use illegal drugs. Some items may interact with your medicine.  What should I watch for while using this medicine?  This drug may make you feel generally unwell. This is not uncommon, as chemotherapy can affect healthy cells as well as cancer cells. Report any side effects. Continue your course of treatment even though you feel ill unless your doctor tells you to stop.  Call your doctor or health care professional for advice if you get a fever, chills or sore throat, or other symptoms of a cold or flu. Do not treat yourself. This drug decreases your body's ability to fight infections. Try to avoid being around people who are sick.  This medicine may increase your risk to bruise or bleed. Call your doctor or health care professional if you notice any unusual bleeding.  You may need blood work done while you are taking this medicine.  Do not become pregnant while taking this medicine or for 2 weeks after stopping it. Women should inform their doctor if they wish to become pregnant or think they might be pregnant. Men should not father a child while taking this medicine and for 3.5 months after stopping it. There is a potential for serious side effects to an unborn child. Talk to your health care   professional or pharmacist for more information. Do not breast-feed an infant while taking this medicine or for 2 weeks after stopping  it.  What side effects may I notice from receiving this medicine?  Side effects that you should report to your doctor or health care professional as soon as possible:  -allergic reactions like skin rash, itching or hives, swelling of the face, lips, or tongue  -low blood counts - this medicine may decrease the number of white blood cells, red blood cells and platelets. You may be at increased risk for infections and bleeding.  -signs of infection - fever or chills, cough, sore throat, pain or difficulty passing urine  -signs of decreased platelets or bleeding - bruising, pinpoint red spots on the skin, black, tarry stools, blood in the urine  -signs of decreased red blood cells - unusually weak or tired, fainting spells, lightheadedness  -pain, tingling, numbness in the hands or feet  Side effects that usually do not require medical attention (Report these to your doctor or health care professional if they continue or are bothersome.):  -constipation  -hair loss  -headache  -loss of appetite  -muscle or joint pain  -nausea, vomiting  -stomach pain  This list may not describe all possible side effects. Call your doctor for medical advice about side effects. You may report side effects to FDA at 1-800-FDA-1088.  Where should I keep my medicine?  This drug is given in a hospital or clinic and will not be stored at home.  NOTE: This sheet is a summary. It may not cover all possible information. If you have questions about this medicine, talk to your doctor, pharmacist, or health care provider.      2016, Elsevier/Gold Standard. (2014-06-01 17:51:40)

## 2015-07-18 NOTE — Progress Notes (Signed)
Patient Care Team: Pcp Not In System as PCP - General  DIAGNOSIS: Metastatic breast cancer triple negative disease  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 ([place])   06/02/2015 Imaging Necrotic left breast mass, small asymmetric left x-ray lymph nodes, 3 cm vegetation attached to the ascending aortic wall tumor versus thrombus versus infection, indeterminate liver (10 mm) and right adrenal (13 mm) masses   06/02/2015 Surgery Left mastectomy: IDC grade 3, 14.7 cm, involves skin with ulceration, LVH present including dermal lymphatics, margins negative, isolated tumor cells 2/4 lymph nodes T4b N0i+M1 (stage 4)   06/07/2015 Imaging MRI brain: Numerous bilateral cerebral metastases (largest left frontal 2.5 x 1.5 cm; Rt: 1.5x1 cm Rt Occip lobe, mild edema associated with these lesions but no mass effect, single small cerebellar met (0.5cm), 3.2 cm meningioma   07/18/2015 -  Chemotherapy Halaven days 1 and 8 every 3 weeks    CHIEF COMPLIANT: Cycle 1 day 1 [place]  INTERVAL HISTORY: Robin Cantrell is a 70 year old with above-mentioned history of metastatic breast cancer with numerous bilateral brain metastases who is here to start cycle 1 of palliative chemotherapy with Halaven. She does not have any active systemic disease based upon CT of the chest abdomen pelvis done on February 3 and fourth 2017. She underwent port placement and is here today to receive her first dose of systemic chemotherapy. She is home currently accompanied by her niece today.  REVIEW OF SYSTEMS:   Constitutional: Denies fevers, chills or abnormal weight loss Eyes: Denies blurriness of vision Ears, nose, mouth, throat, and face: Denies mucositis or sore throat Respiratory: Denies cough, dyspnea or wheezes Cardiovascular: Denies palpitation, chest discomfort Gastrointestinal:  Denies nausea, heartburn or change in bowel habits Skin: Denies abnormal skin  rashes Lymphatics: Denies new lymphadenopathy or easy bruising Neurological: Mild confusion but able to answer questions normally. Behavioral/Psych: Mood is stable, no new changes  Extremities: No lower extremity edema Breast: Skin grafting on the left chest wall after undergoing mastectomy All other systems were reviewed with the patient and are negative.  I have reviewed the past medical history, past surgical history, social history and family history with the patient and they are unchanged from previous note.  ALLERGIES:  has No Known Allergies.  MEDICATIONS:  Current Outpatient Prescriptions  Medication Sig Dispense Refill  . acetaminophen (TYLENOL) 325 MG tablet Take 650 mg by mouth every 6 (six) hours as needed for mild pain.    Marland Kitchen dexamethasone (DECADRON) 2 MG tablet Take 2 mg by mouth daily.    . diphenhydrAMINE (BENADRYL) 25 mg capsule Take 25 mg by mouth at bedtime as needed for sleep.    . feeding supplement, ENSURE ENLIVE, (ENSURE ENLIVE) LIQD Take 237 mLs by mouth 2 (two) times daily between meals. Or alternative formulary at facility 237 mL 12  . hydrOXYzine (ATARAX/VISTARIL) 10 MG tablet Take 10 mg by mouth 3 (three) times daily as needed.    . lidocaine-prilocaine (EMLA) cream Apply to affected area once 30 g 3  . LORazepam (ATIVAN) 0.5 MG tablet Take 1 tablet (0.5 mg total) by mouth every 6 (six) hours as needed (Nausea or vomiting). 30 tablet 0  . Lubricants (K-Y) LIQD Apply 1 application topically every evening.    . ondansetron (ZOFRAN) 8 MG tablet Take 1 tablet (8 mg total) by mouth 2 (two) times daily as needed (Nausea or vomiting). 30 tablet 1  . oxyCODONE (OXY IR/ROXICODONE) 5 MG immediate release tablet  Take 5 mg by mouth every 4 (four) hours as needed for severe pain.    Marland Kitchen prochlorperazine (COMPAZINE) 10 MG tablet Take 1 tablet (10 mg total) by mouth every 6 (six) hours as needed (Nausea or vomiting). 30 tablet 1  . senna-docusate (SENOKOT-S) 8.6-50 MG tablet Take  2 tablets by mouth daily.    . simvastatin (ZOCOR) 10 MG tablet Take 1 tablet (10 mg total) by mouth daily at 6 PM.     No current facility-administered medications for this visit.    PHYSICAL EXAMINATION: ECOG PERFORMANCE STATUS: 1 - Symptomatic but completely ambulatory  Filed Vitals:   07/18/15 1411  BP: 147/96  Pulse: 87  Temp: 98 F (36.7 C)  Resp: 17   Filed Weights   07/18/15 1411  Weight: 149 lb 3.2 oz (67.677 kg)    GENERAL:alert, no distress and comfortable SKIN: skin color, texture, turgor are normal, no rashes or significant lesions EYES: normal, Conjunctiva are pink and non-injected, sclera clear OROPHARYNX:no exudate, no erythema and lips, buccal mucosa, and tongue normal  NECK: supple, thyroid normal size, non-tender, without nodularity LYMPH:  no palpable lymphadenopathy in the cervical, axillary or inguinal LUNGS: clear to auscultation and percussion with normal breathing effort HEART: regular rate & rhythm and no murmurs and no lower extremity edema ABDOMEN:abdomen soft, non-tender and normal bowel sounds MUSCULOSKELETAL:no cyanosis of digits and no clubbing  NEURO: Mild confusion EXTREMITIES: No lower extremity edema  LABORATORY DATA:  I have reviewed the data as listed   Chemistry      Component Value Date/Time   NA 137 07/18/2015 1351   NA 140 06/26/2015 0831   K 4.5 07/18/2015 1351   K 3.9 06/26/2015 0831   CL 101 06/26/2015 0831   CO2 25 07/18/2015 1351   CO2 26 06/26/2015 0831   BUN 35.8* 07/18/2015 1351   BUN 24* 06/26/2015 0831   CREATININE 1.1 07/18/2015 1351   CREATININE 0.84 06/26/2015 0831      Component Value Date/Time   CALCIUM 9.6 07/18/2015 1351   CALCIUM 9.9 06/26/2015 0831   ALKPHOS 60 07/18/2015 1351   ALKPHOS 63 06/07/2015 0534   AST 23 07/18/2015 1351   AST 22 06/07/2015 0534   ALT 22 07/18/2015 1351   ALT 18 06/07/2015 0534   BILITOT 0.32 07/18/2015 1351   BILITOT 0.7 06/07/2015 0534       Lab Results   Component Value Date   WBC 10.3 07/18/2015   HGB 11.7 07/18/2015   HCT 36.4 07/18/2015   MCV 81.7 07/18/2015   PLT 366 07/18/2015   NEUTROABS 7.4* 07/18/2015     ASSESSMENT & PLAN:  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 ([facility]) Left mastectomy: IDC grade 3, 14.7 cm, involves skin with ulceration, LVI present including dermal lymphatics, margins negative, isolated tumor cells 2/4 lymph nodes T4b N0i+M1 (stage 4) CT chest abdomen pelvis 06/03/2015 did not reveal any other evidence of distant metastatic disease Status post whole brain radiation therapy 06/13/2015- 06/29/2015  Current treatment: Palliative chemotherapy with Halaven days 1 and 8 every 3 weeks today's cycle 1 day 1 Antiemetics were reviewed Labs were reviewed Port appears to be healing well Return to clinic in 1 week for toxicity check and for cycle 1 day 8.   No orders of the defined types were placed in this encounter.   The patient has a good understanding of the overall plan. she agrees with it. she will call with any problems that may develop before  the next visit here.   Rulon Eisenmenger, MD 07/18/2015

## 2015-07-19 ENCOUNTER — Encounter: Payer: Self-pay | Admitting: *Deleted

## 2015-07-24 ENCOUNTER — Other Ambulatory Visit: Payer: Self-pay

## 2015-07-24 DIAGNOSIS — C7931 Secondary malignant neoplasm of brain: Secondary | ICD-10-CM

## 2015-07-24 MED ORDER — LIDOCAINE-PRILOCAINE 2.5-2.5 % EX CREA: TOPICAL_CREAM | CUTANEOUS | Status: DC

## 2015-07-25 ENCOUNTER — Encounter: Payer: Self-pay | Admitting: Hematology and Oncology

## 2015-07-25 ENCOUNTER — Ambulatory Visit (HOSPITAL_BASED_OUTPATIENT_CLINIC_OR_DEPARTMENT_OTHER): Payer: Medicare HMO

## 2015-07-25 ENCOUNTER — Ambulatory Visit (HOSPITAL_BASED_OUTPATIENT_CLINIC_OR_DEPARTMENT_OTHER): Payer: Medicare HMO | Admitting: Hematology and Oncology

## 2015-07-25 ENCOUNTER — Other Ambulatory Visit (HOSPITAL_BASED_OUTPATIENT_CLINIC_OR_DEPARTMENT_OTHER): Payer: Medicare HMO

## 2015-07-25 ENCOUNTER — Encounter: Payer: Self-pay | Admitting: *Deleted

## 2015-07-25 VITALS — BP 157/85 | HR 87 | Temp 98.3°F | Resp 18 | Ht 68.0 in | Wt 152.2 lb

## 2015-07-25 DIAGNOSIS — Z5111 Encounter for antineoplastic chemotherapy: Secondary | ICD-10-CM

## 2015-07-25 DIAGNOSIS — C50812 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: Secondary | ICD-10-CM | POA: Diagnosis not present

## 2015-07-25 DIAGNOSIS — C7931 Secondary malignant neoplasm of brain: Secondary | ICD-10-CM | POA: Diagnosis not present

## 2015-07-25 DIAGNOSIS — D649 Anemia, unspecified: Secondary | ICD-10-CM

## 2015-07-25 LAB — COMPREHENSIVE METABOLIC PANEL
ALBUMIN: 3.4 g/dL — AB (ref 3.5–5.0)
ALT: 26 U/L (ref 0–55)
AST: 25 U/L (ref 5–34)
Alkaline Phosphatase: 57 U/L (ref 40–150)
Anion Gap: 9 mEq/L (ref 3–11)
BUN: 30.4 mg/dL — AB (ref 7.0–26.0)
CALCIUM: 9.5 mg/dL (ref 8.4–10.4)
CHLORIDE: 106 meq/L (ref 98–109)
CO2: 24 mEq/L (ref 22–29)
CREATININE: 1.1 mg/dL (ref 0.6–1.1)
EGFR: 62 mL/min/{1.73_m2} — ABNORMAL LOW (ref >90)
GLUCOSE: 109 mg/dL (ref 70–140)
Potassium: 3.9 mEq/L (ref 3.5–5.1)
Sodium: 139 mEq/L (ref 136–145)
Total Bilirubin: 0.33 mg/dL (ref 0.20–1.20)
Total Protein: 7.6 g/dL (ref 6.4–8.3)

## 2015-07-25 LAB — CBC WITH DIFFERENTIAL/PLATELET
BASO%: 0.9 % (ref 0.0–2.0)
Basophils Absolute: 0.1 10*3/uL (ref 0.0–0.1)
EOS%: 0.2 % (ref 0.0–7.0)
Eosinophils Absolute: 0 10*3/uL (ref 0.0–0.5)
HEMATOCRIT: 31.3 % — AB (ref 34.8–46.6)
HEMOGLOBIN: 10.2 g/dL — AB (ref 11.6–15.9)
LYMPH#: 1.4 10*3/uL (ref 0.9–3.3)
LYMPH%: 23 % (ref 14.0–49.7)
MCH: 26.6 pg (ref 25.1–34.0)
MCHC: 32.7 g/dL (ref 31.5–36.0)
MCV: 81.3 fL (ref 79.5–101.0)
MONO#: 0.2 10*3/uL (ref 0.1–0.9)
MONO%: 3.6 % (ref 0.0–14.0)
NEUT#: 4.4 10*3/uL (ref 1.5–6.5)
NEUT%: 72.3 % (ref 38.4–76.8)
Platelets: 409 10*3/uL — ABNORMAL HIGH (ref 145–400)
RBC: 3.85 10*6/uL (ref 3.70–5.45)
RDW: 20.7 % — AB (ref 11.2–14.5)
WBC: 6.1 10*3/uL (ref 3.9–10.3)

## 2015-07-25 MED ORDER — HEPARIN SOD (PORK) LOCK FLUSH 100 UNIT/ML IV SOLN
500.0000 [IU] | Freq: Once | INTRAVENOUS | Status: AC | PRN
  Administered 2015-07-25: 500 [IU]
  Filled 2015-07-25: qty 5

## 2015-07-25 MED ORDER — SODIUM CHLORIDE 0.9 % IV SOLN
1.4000 mg/m2 | Freq: Once | INTRAVENOUS | Status: AC
  Administered 2015-07-25: 2.5 mg via INTRAVENOUS
  Filled 2015-07-25: qty 5

## 2015-07-25 MED ORDER — SODIUM CHLORIDE 0.9 % IV SOLN
Freq: Once | INTRAVENOUS | Status: AC
  Administered 2015-07-25: 16:00:00 via INTRAVENOUS

## 2015-07-25 MED ORDER — SODIUM CHLORIDE 0.9% FLUSH
10.0000 mL | INTRAVENOUS | Status: DC | PRN
Start: 2015-07-25 — End: 2015-07-25
  Administered 2015-07-25: 10 mL
  Filled 2015-07-25: qty 10

## 2015-07-25 MED ORDER — PROCHLORPERAZINE MALEATE 10 MG PO TABS
ORAL_TABLET | ORAL | Status: AC
  Filled 2015-07-25: qty 1

## 2015-07-25 MED ORDER — PROCHLORPERAZINE MALEATE 10 MG PO TABS
10.0000 mg | ORAL_TABLET | Freq: Once | ORAL | Status: AC
Start: 2015-07-25 — End: 2015-07-25
  Administered 2015-07-25: 10 mg via ORAL

## 2015-07-25 NOTE — Patient Instructions (Signed)
Eribulin solution for injection  What is this medicine?  ERIBULIN (er e bu lin) is a chemotherapy drug. It is used to treat breast cancer and liposarcoma.  This medicine may be used for other purposes; ask your health care provider or pharmacist if you have questions.  What should I tell my health care provider before I take this medicine?  They need to know if you have any of these conditions:  -heart disease  -history of irregular heartbeat  -kidney disease  -liver disease  -low blood counts, like low white cell, platelet, or red cell counts  -low levels of potassium or magnesium in the blood  -an unusual or allergic reaction to eribulin, other medicines, foods, dyes, or preservatives  -pregnant or trying to get pregnant  -breast-feeding  How should I use this medicine?  This medicine is for infusion into a vein. It is given by a health care professional in a hospital or clinic setting.  Talk to your pediatrician regarding the use of this medicine in children. Special care may be needed.  Overdosage: If you think you have taken too much of this medicine contact a poison control center or emergency room at once.  NOTE: This medicine is only for you. Do not share this medicine with others.  What if I miss a dose?  It is important not to miss your dose. Call your doctor or health care professional if you are unable to keep an appointment.  What may interact with this medicine?  Do not take this medicine with any of the following medications:  -amiodarone  -astemizole  -arsenic trioxide  -bepridil  -bretylium  -chloroquine  -chlorpromazine  -cisapride  -clarithromycin  -dextromethorphan,  quinidine  -disopyramide  -dofetilide  -droperidol  -dronedarone  -erythromycin  -grepafloxacin  -halofantrine  -haloperidol  -ibutilide  -levomethadyl  -mesoridazine  -methadone  -pentamidine  -procainamide  -quinidine  -pimozide  -posaconazole  -probucol  -propafenone  -saquinavir  -sotalol  -sparfloxacin  -terfenadine  -thioridazine  -troleandomycin  -ziprasidone  This list may not describe all possible interactions. Give your health care provider a list of all the medicines, herbs, non-prescription drugs, or dietary supplements you use. Also tell them if you smoke, drink alcohol, or use illegal drugs. Some items may interact with your medicine.  What should I watch for while using this medicine?  This drug may make you feel generally unwell. This is not uncommon, as chemotherapy can affect healthy cells as well as cancer cells. Report any side effects. Continue your course of treatment even though you feel ill unless your doctor tells you to stop.  Call your doctor or health care professional for advice if you get a fever, chills or sore throat, or other symptoms of a cold or flu. Do not treat yourself. This drug decreases your body's ability to fight infections. Try to avoid being around people who are sick.  This medicine may increase your risk to bruise or bleed. Call your doctor or health care professional if you notice any unusual bleeding.  You may need blood work done while you are taking this medicine.  Do not become pregnant while taking this medicine or for 2 weeks after stopping it. Women should inform their doctor if they wish to become pregnant or think they might be pregnant. Men should not father a child while taking this medicine and for 3.5 months after stopping it. There is a potential for serious side effects to an unborn child. Talk to your health care   professional or pharmacist for more information. Do not breast-feed an infant while taking this medicine or for 2 weeks after stopping  it.  What side effects may I notice from receiving this medicine?  Side effects that you should report to your doctor or health care professional as soon as possible:  -allergic reactions like skin rash, itching or hives, swelling of the face, lips, or tongue  -low blood counts - this medicine may decrease the number of white blood cells, red blood cells and platelets. You may be at increased risk for infections and bleeding.  -signs of infection - fever or chills, cough, sore throat, pain or difficulty passing urine  -signs of decreased platelets or bleeding - bruising, pinpoint red spots on the skin, black, tarry stools, blood in the urine  -signs of decreased red blood cells - unusually weak or tired, fainting spells, lightheadedness  -pain, tingling, numbness in the hands or feet  Side effects that usually do not require medical attention (Report these to your doctor or health care professional if they continue or are bothersome.):  -constipation  -hair loss  -headache  -loss of appetite  -muscle or joint pain  -nausea, vomiting  -stomach pain  This list may not describe all possible side effects. Call your doctor for medical advice about side effects. You may report side effects to FDA at 1-800-FDA-1088.  Where should I keep my medicine?  This drug is given in a hospital or clinic and will not be stored at home.  NOTE: This sheet is a summary. It may not cover all possible information. If you have questions about this medicine, talk to your doctor, pharmacist, or health care provider.      2016, Elsevier/Gold Standard. (2014-06-01 17:51:40)

## 2015-07-25 NOTE — Progress Notes (Signed)
Patient Care Team: Pcp Not In System as PCP - General  DIAGNOSIS: metastatic breast cancer with brain metastases  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 ([place])   06/02/2015 Imaging Necrotic left breast mass, small asymmetric left x-ray lymph nodes, 3 cm vegetation attached to the ascending aortic wall tumor versus thrombus versus infection, indeterminate liver (10 mm) and right adrenal (13 mm) masses   06/02/2015 Surgery Left mastectomy: IDC grade 3, 14.7 cm, involves skin with ulceration, LVH present including dermal lymphatics, margins negative, isolated tumor cells 2/4 lymph nodes T4b N0i+M1 (stage 4)   06/07/2015 Imaging MRI brain: Numerous bilateral cerebral metastases (largest left frontal 2.5 x 1.5 cm; Rt: 1.5x1 cm Rt Occip lobe, mild edema associated with these lesions but no mass effect, single small cerebellar met (0.5cm), 3.2 cm meningioma   07/18/2015 -  Chemotherapy Halaven days 1 and 8 every 3 weeks    CHIEF COMPLIANT: cycle 1 day 8 [place]  INTERVAL HISTORY: Robin Cantrell is a 70 year old with above-mentioned history metastatic breast cancer currently on palliative chemotherapy with Halaven. Today is cycle 1 day 8. She is tolerating chemotherapy extremely well. She denies any nausea or vomiting. She denied any bowel in a disturbances. She has excellent appetite and taste.she does have slight imbalance with walking.  REVIEW OF SYSTEMS:   Constitutional: Denies fevers, chills or abnormal weight loss Eyes: Denies blurriness of vision Ears, nose, mouth, throat, and face: Denies mucositis or sore throat Respiratory: Denies cough, dyspnea or wheezes Cardiovascular: Denies palpitation, chest discomfort Gastrointestinal:  Denies nausea, heartburn or change in bowel habits Skin: Denies abnormal skin rashes Lymphatics: Denies new lymphadenopathy or easy bruising Neurological:gait ataxia Behavioral/Psych: Mood is stable, no new  changes  Extremities: No lower extremity edema Breast:mastectomy with skin graft All other systems were reviewed with the patient and are negative.  I have reviewed the past medical history, past surgical history, social history and family history with the patient and they are unchanged from previous note.  ALLERGIES:  has No Known Allergies.  MEDICATIONS:  Current Outpatient Prescriptions  Medication Sig Dispense Refill  . acetaminophen (TYLENOL) 325 MG tablet Take 650 mg by mouth every 6 (six) hours as needed for mild pain.    Marland Kitchen dexamethasone (DECADRON) 2 MG tablet Take 2 mg by mouth daily.    . diphenhydrAMINE (BENADRYL) 25 mg capsule Take 25 mg by mouth at bedtime as needed for sleep.    . feeding supplement, ENSURE ENLIVE, (ENSURE ENLIVE) LIQD Take 237 mLs by mouth 2 (two) times daily between meals. Or alternative formulary at facility 237 mL 12  . hydrOXYzine (ATARAX/VISTARIL) 10 MG tablet Take 10 mg by mouth 3 (three) times daily as needed.    . lidocaine-prilocaine (EMLA) cream Apply to affected area once 30 g 3  . LORazepam (ATIVAN) 0.5 MG tablet Take 1 tablet (0.5 mg total) by mouth every 6 (six) hours as needed (Nausea or vomiting). 30 tablet 0  . Lubricants (K-Y) LIQD Apply 1 application topically every evening.    . ondansetron (ZOFRAN) 8 MG tablet Take 1 tablet (8 mg total) by mouth 2 (two) times daily as needed (Nausea or vomiting). 30 tablet 1  . oxyCODONE (OXY IR/ROXICODONE) 5 MG immediate release tablet Take 5 mg by mouth every 4 (four) hours as needed for severe pain.    Marland Kitchen prochlorperazine (COMPAZINE) 10 MG tablet Take 1 tablet (10 mg total) by mouth every 6 (six) hours as needed (  Nausea or vomiting). 30 tablet 1  . senna-docusate (SENOKOT-S) 8.6-50 MG tablet Take 2 tablets by mouth daily.    . simvastatin (ZOCOR) 10 MG tablet Take 1 tablet (10 mg total) by mouth daily at 6 PM.     No current facility-administered medications for this visit.    PHYSICAL  EXAMINATION: ECOG PERFORMANCE STATUS: 1 - Symptomatic but completely ambulatory  Filed Vitals:   07/25/15 1427  BP: 157/85  Pulse: 87  Temp: 98.3 F (36.8 C)  Resp: 18   Filed Weights   07/25/15 1427  Weight: 152 lb 3.2 oz (69.037 kg)    GENERAL:alert, no distress and comfortable SKIN: skin color, texture, turgor are normal, no rashes or significant lesions EYES: normal, Conjunctiva are pink and non-injected, sclera clear OROPHARYNX:no exudate, no erythema and lips, buccal mucosa, and tongue normal  NECK: supple, thyroid normal size, non-tender, without nodularity LYMPH:  no palpable lymphadenopathy in the cervical, axillary or inguinal LUNGS: clear to auscultation and percussion with normal breathing effort HEART: regular rate & rhythm and no murmurs and no lower extremity edema ABDOMEN:abdomen soft, non-tender and normal bowel sounds MUSCULOSKELETAL:no cyanosis of digits and no clubbing  NEURO: alert & oriented x 3 with fluent speech, gait ataxia EXTREMITIES: No lower extremity edema  LABORATORY DATA:  I have reviewed the data as listed   Chemistry      Component Value Date/Time   NA 137 07/18/2015 1351   NA 140 06/26/2015 0831   K 4.5 07/18/2015 1351   K 3.9 06/26/2015 0831   CL 101 06/26/2015 0831   CO2 25 07/18/2015 1351   CO2 26 06/26/2015 0831   BUN 35.8* 07/18/2015 1351   BUN 24* 06/26/2015 0831   CREATININE 1.1 07/18/2015 1351   CREATININE 0.84 06/26/2015 0831      Component Value Date/Time   CALCIUM 9.6 07/18/2015 1351   CALCIUM 9.9 06/26/2015 0831   ALKPHOS 60 07/18/2015 1351   ALKPHOS 63 06/07/2015 0534   AST 23 07/18/2015 1351   AST 22 06/07/2015 0534   ALT 22 07/18/2015 1351   ALT 18 06/07/2015 0534   BILITOT 0.32 07/18/2015 1351   BILITOT 0.7 06/07/2015 0534       Lab Results  Component Value Date   WBC 6.1 07/25/2015   HGB 10.2* 07/25/2015   HCT 31.3* 07/25/2015   MCV 81.3 07/25/2015   PLT 409* 07/25/2015   NEUTROABS 4.4 07/25/2015      ASSESSMENT & PLAN:  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 ([facility]) Left mastectomy: IDC grade 3, 14.7 cm, involves skin with ulceration, LVI present including dermal lymphatics, margins negative, isolated tumor cells 2/4 lymph nodes T4b N0i+M1 (stage 4) CT chest abdomen pelvis 06/03/2015 did not reveal any other evidence of distant metastatic disease Status post whole brain radiation therapy 06/13/2015- 06/29/2015  Current treatment: Palliative chemotherapy with [place] days 1 and 8 every 3 weeks today's cycle 1 day 8 Chemotherapy toxicities: Denies any nausea vomiting 1. Normocytic anemia: Multifactorial Monitoring closely for toxicities Labs were reviewed  Return to clinic in 3 weeks for toxicity check and for cycle 2 day 8.   No orders of the defined types were placed in this encounter.   The patient has a good understanding of the overall plan. she agrees with it. she will call with any problems that may develop before the next visit here.   Rulon Eisenmenger, MD 07/25/2015

## 2015-07-25 NOTE — Progress Notes (Signed)
Unable to get in to exam room prior to MD.  No assessment performed.  

## 2015-07-25 NOTE — Assessment & Plan Note (Signed)
Left mastectomy: IDC grade 3, 14.7 cm, involves skin with ulceration, LVI present including dermal lymphatics, margins negative, isolated tumor cells 2/4 lymph nodes T4b N0i+M1 (stage 4) CT chest abdomen pelvis 06/03/2015 did not reveal any other evidence of distant metastatic disease Status post whole brain radiation therapy 06/13/2015- 06/29/2015  Current treatment: Palliative chemotherapy with Halaven days 1 and 8 every 3 weeks today's cycle 1 day 8 Chemotherapy toxicities:  Labs were reviewed  Return to clinic in 3 weeks for toxicity check and for cycle 2 day 8.

## 2015-07-26 ENCOUNTER — Telehealth: Payer: Self-pay | Admitting: Hematology and Oncology

## 2015-07-26 NOTE — Telephone Encounter (Signed)
sch ov appt to April visit per VG 2/28 pof

## 2015-07-27 ENCOUNTER — Encounter: Payer: Self-pay | Admitting: Radiation Oncology

## 2015-07-27 ENCOUNTER — Ambulatory Visit
Admission: RE | Admit: 2015-07-27 | Discharge: 2015-07-27 | Disposition: A | Discharge status: Home / Self Care | Payer: Medicare HMO | Source: Ambulatory Visit | Attending: Radiation Oncology | Admitting: Radiation Oncology

## 2015-07-27 VITALS — BP 139/79 | HR 104 | Resp 16 | Wt 153.8 lb

## 2015-07-27 DIAGNOSIS — C7931 Secondary malignant neoplasm of brain: Secondary | ICD-10-CM

## 2015-07-27 NOTE — Progress Notes (Signed)
Radiation Oncology         [phone] ________________________________  Name: Robin Cantrell MRN: [id number]  Date: 07/27/2015  DOB: [birth year]  Follow-Up Visit Note  CC: Pcp Not In System  [place], Mathis Dad, MD  Diagnosis:   70 yo woman with multiple brain mets from locally advanced breast cancer    ICD-9-CM ICD-10-CM   1. Brain metastasis ([facility]) 198.3 C79.31     Interval Since Last Radiation:  1 month whole brain RT  Narrative:  The patient returns today for routine follow-up. Weight and vitals stable. Denies pain. Reports taking decadron 2 mg once daily. No thrush noted. Hyperpigmentation of forehead noted. Provided patient with Aquaphor cream and directed upon use. Patient verbalized understanding. Patient reports floaters. Denies headache, dizziness, nausea, vomiting, dizziness, or ringing in the ears. Denies any seizure activity. Reports fatigue. Wound vac to chest has been discontinued. She mentions that she has been tapering her Decadron and is currently taking 1/2 daily. She is currently receiving chemotherapy and she received her second round out of six yesterday.  ALLERGIES:  has No Known Allergies.  Meds: Current Outpatient Prescriptions  Medication Sig Dispense Refill  . dexamethasone (DECADRON) 2 MG tablet Take 2 mg by mouth daily.    . simvastatin (ZOCOR) 10 MG tablet Take 1 tablet (10 mg total) by mouth daily at 6 PM.    . acetaminophen (TYLENOL) 325 MG tablet Take 650 mg by mouth every 6 (six) hours as needed for mild pain. Reported on 07/27/2015    . dexamethasone (DECADRON) 2 MG tablet Take by mouth. Reported on 07/27/2015    . diphenhydrAMINE (BENADRYL) 25 mg capsule Take 25 mg by mouth at bedtime as needed for sleep. Reported on 07/27/2015    . feeding supplement, ENSURE ENLIVE, (ENSURE ENLIVE) LIQD Take 237 mLs by mouth 2 (two) times daily between meals. Or alternative formulary at facility (Patient not taking: Reported on 07/27/2015) 237 mL 12  . hydrOXYzine  (ATARAX/VISTARIL) 10 MG tablet Take 10 mg by mouth 3 (three) times daily as needed. Reported on 07/27/2015    . lidocaine-prilocaine (EMLA) cream Apply to affected area once (Patient not taking: Reported on 07/27/2015) 30 g 3  . LORazepam (ATIVAN) 0.5 MG tablet Take 1 tablet (0.5 mg total) by mouth every 6 (six) hours as needed (Nausea or vomiting). (Patient not taking: Reported on 07/27/2015) 30 tablet 0  . Lubricants (K-Y) LIQD Apply 1 application topically every evening. Reported on 07/27/2015    . ondansetron (ZOFRAN) 8 MG tablet Take 1 tablet (8 mg total) by mouth 2 (two) times daily as needed (Nausea or vomiting). (Patient not taking: Reported on 07/27/2015) 30 tablet 1  . oxyCODONE (OXY IR/ROXICODONE) 5 MG immediate release tablet Take 5 mg by mouth every 4 (four) hours as needed for severe pain. Reported on 07/27/2015    . prochlorperazine (COMPAZINE) 10 MG tablet Take 1 tablet (10 mg total) by mouth every 6 (six) hours as needed (Nausea or vomiting). (Patient not taking: Reported on 07/27/2015) 30 tablet 1  . senna-docusate (SENOKOT-S) 8.6-50 MG tablet Take 2 tablets by mouth daily. Reported on 07/27/2015    . simvastatin (ZOCOR) 10 MG tablet Take by mouth. Reported on 07/27/2015     No current facility-administered medications for this encounter.    Physical Findings: The patient is in no acute distress. Patient is alert and oriented.  weight is 153 lb 12.8 oz (69.763 kg). Her blood pressure is 139/79 and her pulse is 104. Her respiration  is 16 and oxygen saturation is 100%. .  No significant changes.  Lab Findings: Lab Results  Component Value Date   WBC 6.1 07/25/2015   WBC 18.7* 06/26/2015   HGB 10.2* 07/25/2015   HGB 11.4* 06/26/2015   HCT 31.3* 07/25/2015   HCT 36.6 06/26/2015   HCT 19.4* 06/03/2015   PLT 409* 07/25/2015   PLT 620* 06/26/2015    Lab Results  Component Value Date   NA 139 07/25/2015   NA 140 06/26/2015   K 3.9 07/25/2015   K 3.9 06/26/2015   CHLORIDE 106  07/25/2015   CO2 24 07/25/2015   CO2 26 06/26/2015   GLUCOSE 109 07/25/2015   GLUCOSE 88 06/26/2015   BUN 30.4* 07/25/2015   BUN 24* 06/26/2015   CREATININE 1.1 07/25/2015   CREATININE 0.84 06/26/2015   BILITOT 0.33 07/25/2015   BILITOT 0.7 06/07/2015   ALKPHOS 57 07/25/2015   ALKPHOS 63 06/07/2015   AST 25 07/25/2015   AST 22 06/07/2015   ALT 26 07/25/2015   ALT 18 06/07/2015   PROT 7.6 07/25/2015   PROT 5.1* 06/07/2015   ALBUMIN 3.4* 07/25/2015   ALBUMIN 1.8* 06/07/2015   CALCIUM 9.5 07/25/2015   CALCIUM 9.9 06/26/2015   ANIONGAP 9 07/25/2015   ANIONGAP 13 06/26/2015    Radiographic Findings: Dg Chest Port 1 View  07/11/2015  CLINICAL DATA:  Status post port placement EXAM: PORTABLE CHEST 1 VIEW COMPARISON:  None. FINDINGS: Cardiac shadow is within normal limits. A right-sided chest wall port is noted with the catheter tip in the mid to distal superior vena cava. The lungs are clear. No pneumothorax is seen. IMPRESSION: Status post right chest port placement Electronically Signed   By: Inez Catalina M.D.   On: 07/11/2015 09:12   Dg Fluoro Guide Cv Line-no Report  07/11/2015  CLINICAL DATA:  FLOURO GUIDE CV LINE Fluoroscopy was utilized by the requesting physician.  No radiographic interpretation.    Impression:  The patient is recovering from the effects of radiation.  Plan:  She will be scheduled for a new brain MRI in about 2 weeks. I will follow up with her after the MRI.  _____________________________________  Sheral Apley. Tammi Klippel, M.D.    This document serves as a record of services personally performed by Tyler Pita, MD. It was created on his behalf by Lendon Collar, a trained medical scribe. The creation of this record is based on the scribe's personal observations and the provider's statements to them. This document has been checked and approved by the attending provider.

## 2015-07-27 NOTE — Progress Notes (Signed)
Weight and vitals stable. Denies pain. Reports taking decadron 2 mg once daily. No thrush noted. Hyperpigmentation of forehead noted. Provided patient with Aquaphor cream and directed upon use. Patient verbalized understanding. Patient reports floaters. Denies headache, dizziness, nausea, vomiting, dizziness, or ringing in the ears. Denies any seizure activity. Reports fatigue. Wound vac to chest has been discontinued.   BP 139/79 mmHg  Pulse 104  Resp 16  Wt 153 lb 12.8 oz (69.763 kg)  SpO2 100% Wt Readings from Last 3 Encounters:  07/27/15 153 lb 12.8 oz (69.763 kg)  07/25/15 152 lb 3.2 oz (69.037 kg)  07/18/15 149 lb 3.2 oz (67.677 kg)

## 2015-07-28 ENCOUNTER — Encounter: Payer: Self-pay | Admitting: General Practice

## 2015-07-28 NOTE — Progress Notes (Signed)
Spiritual Care Note  Noting in chart that pt is staying with her emergency contact, niece Roosvelt Maser, attempted to reach Ms Richman at that number.  Per niece, pt was resting due to fatigue from chemo.  Per niece, family has experienced several losses in past two years, including the unexpected death of [name]'s husband last night.  Provided bereavement and prayer support over the phone, introducing Support Team's availability for further support for whole family system as desired.  [name] verbalized need and deep gratitude.  Plan to f/u by phone next week, both to reach pt and to provide further bereavement support to niece Francisco Capuchin, who has my contact info as well.  [place], [place], [facility] Pager [phone] Voicemail  [phone]

## 2015-08-02 ENCOUNTER — Encounter: Payer: Self-pay | Admitting: General Practice

## 2015-08-02 NOTE — Progress Notes (Signed)
Spiritual Care Note  Pt's phone number listed as "home" is actually her cell.  Reached Robin Cantrell by phone to check in and offer emotional support.  Per pt, she is doing well and has no concerns at this time.  Also reached pt's niece, Robin Cantrell, for f/u caregiver and grief support.  Again normalized the exhausting complexity of grief and encouraged [name] to seek additional support as desired.  Family aware of ongoing chaplain availability, but please also page as needs arise.  Thank you.  [place], [place], [facility] Pager [phone] Voicemail  [phone]

## 2015-08-03 ENCOUNTER — Ambulatory Visit: Payer: Self-pay | Admitting: Radiation Oncology

## 2015-08-03 ENCOUNTER — Other Ambulatory Visit: Payer: Self-pay | Admitting: Radiation Therapy

## 2015-08-03 DIAGNOSIS — C7931 Secondary malignant neoplasm of brain: Secondary | ICD-10-CM

## 2015-08-03 DIAGNOSIS — C7949 Secondary malignant neoplasm of other parts of nervous system: Principal | ICD-10-CM

## 2015-08-04 ENCOUNTER — Encounter (HOSPITAL_COMMUNITY): Payer: Self-pay | Admitting: Surgery

## 2015-08-08 ENCOUNTER — Other Ambulatory Visit: Payer: Medicare HMO

## 2015-08-08 ENCOUNTER — Ambulatory Visit: Payer: Medicare HMO

## 2015-08-08 ENCOUNTER — Telehealth: Payer: Self-pay | Admitting: *Deleted

## 2015-08-08 NOTE — Telephone Encounter (Signed)
Patient's niece called and moved appt from today to Thursday. They have new date and time

## 2015-08-10 ENCOUNTER — Ambulatory Visit (HOSPITAL_BASED_OUTPATIENT_CLINIC_OR_DEPARTMENT_OTHER): Payer: Medicare HMO

## 2015-08-10 ENCOUNTER — Other Ambulatory Visit (HOSPITAL_BASED_OUTPATIENT_CLINIC_OR_DEPARTMENT_OTHER): Payer: Medicare HMO

## 2015-08-10 VITALS — BP 127/86 | HR 89 | Temp 98.4°F | Resp 18

## 2015-08-10 DIAGNOSIS — Z5111 Encounter for antineoplastic chemotherapy: Secondary | ICD-10-CM

## 2015-08-10 DIAGNOSIS — C50812 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: Secondary | ICD-10-CM

## 2015-08-10 DIAGNOSIS — C7931 Secondary malignant neoplasm of brain: Secondary | ICD-10-CM

## 2015-08-10 LAB — COMPREHENSIVE METABOLIC PANEL
ALK PHOS: 53 U/L (ref 40–150)
ALT: 10 U/L (ref 0–55)
ANION GAP: 11 meq/L (ref 3–11)
AST: 21 U/L (ref 5–34)
Albumin: 3.3 g/dL — ABNORMAL LOW (ref 3.5–5.0)
BILIRUBIN TOTAL: 0.42 mg/dL (ref 0.20–1.20)
BUN: 14 mg/dL (ref 7.0–26.0)
CALCIUM: 9.8 mg/dL (ref 8.4–10.4)
CO2: 24 mEq/L (ref 22–29)
CREATININE: 1.1 mg/dL (ref 0.6–1.1)
Chloride: 106 mEq/L (ref 98–109)
EGFR: 57 mL/min/{1.73_m2} — ABNORMAL LOW (ref >90)
Glucose: 104 mg/dl (ref 70–140)
Potassium: 3.7 mEq/L (ref 3.5–5.1)
Sodium: 142 mEq/L (ref 136–145)
TOTAL PROTEIN: 7.7 g/dL (ref 6.4–8.3)

## 2015-08-10 LAB — CBC WITH DIFFERENTIAL/PLATELET
BASO%: 0.4 % (ref 0.0–2.0)
BASOS ABS: 0 10*3/uL (ref 0.0–0.1)
EOS ABS: 0 10*3/uL (ref 0.0–0.5)
EOS%: 0 % (ref 0.0–7.0)
HEMATOCRIT: 33.1 % — AB (ref 34.8–46.6)
HGB: 10.9 g/dL — ABNORMAL LOW (ref 11.6–15.9)
LYMPH#: 1.2 10*3/uL (ref 0.9–3.3)
LYMPH%: 15.2 % (ref 14.0–49.7)
MCH: 27 pg (ref 25.1–34.0)
MCHC: 32.9 g/dL (ref 31.5–36.0)
MCV: 82.1 fL (ref 79.5–101.0)
MONO#: 0.6 10*3/uL (ref 0.1–0.9)
MONO%: 7 % (ref 0.0–14.0)
NEUT%: 77.4 % — AB (ref 38.4–76.8)
NEUTROS ABS: 6.2 10*3/uL (ref 1.5–6.5)
PLATELETS: 436 10*3/uL — AB (ref 145–400)
RBC: 4.03 10*6/uL (ref 3.70–5.45)
RDW: 17.9 % — AB (ref 11.2–14.5)
WBC: 8 10*3/uL (ref 3.9–10.3)

## 2015-08-10 MED ORDER — SODIUM CHLORIDE 0.9 % IV SOLN
1.4000 mg/m2 | Freq: Once | INTRAVENOUS | Status: AC
  Administered 2015-08-10: 2.5 mg via INTRAVENOUS
  Filled 2015-08-10: qty 5

## 2015-08-10 MED ORDER — HEPARIN SOD (PORK) LOCK FLUSH 100 UNIT/ML IV SOLN
500.0000 [IU] | Freq: Once | INTRAVENOUS | Status: AC | PRN
  Administered 2015-08-10: 500 [IU]
  Filled 2015-08-10: qty 5

## 2015-08-10 MED ORDER — PROCHLORPERAZINE MALEATE 10 MG PO TABS
10.0000 mg | ORAL_TABLET | Freq: Once | ORAL | Status: AC
  Administered 2015-08-10: 10 mg via ORAL

## 2015-08-10 MED ORDER — SODIUM CHLORIDE 0.9% FLUSH
10.0000 mL | INTRAVENOUS | Status: DC | PRN
  Administered 2015-08-10: 10 mL
  Filled 2015-08-10: qty 10

## 2015-08-10 MED ORDER — SODIUM CHLORIDE 0.9 % IV SOLN
Freq: Once | INTRAVENOUS | Status: AC
  Administered 2015-08-10: 16:00:00 via INTRAVENOUS

## 2015-08-10 MED ORDER — PROCHLORPERAZINE MALEATE 10 MG PO TABS
ORAL_TABLET | ORAL | Status: AC
  Filled 2015-08-10: qty 1

## 2015-08-10 NOTE — Patient Instructions (Signed)
[facility] Discharge Instructions for Patients Receiving Chemotherapy  Today you received the following chemotherapy agents [place].  To help prevent nausea and vomiting after your treatment, we encourage you to take your nausea medication as prescribed.   If you develop nausea and vomiting that is not controlled by your nausea medication, call the clinic.   BELOW ARE SYMPTOMS THAT SHOULD BE REPORTED IMMEDIATELY:  *FEVER GREATER THAN 100.5 F  *CHILLS WITH OR WITHOUT FEVER  NAUSEA AND VOMITING THAT IS NOT CONTROLLED WITH YOUR NAUSEA MEDICATION  *UNUSUAL SHORTNESS OF BREATH  *UNUSUAL BRUISING OR BLEEDING  TENDERNESS IN MOUTH AND THROAT WITH OR WITHOUT PRESENCE OF ULCERS  *URINARY PROBLEMS  *BOWEL PROBLEMS  UNUSUAL RASH Items with * indicate a potential emergency and should be followed up as soon as possible.  Feel free to call the clinic you have any questions or concerns. The clinic phone number is [phone].  Please show the CHEMO ALERT CARD at check-in to the Emergency Department and triage nurse.   

## 2015-08-11 ENCOUNTER — Ambulatory Visit
Admission: RE | Admit: 2015-08-11 | Discharge: 2015-08-11 | Disposition: A | Discharge status: Home / Self Care | Payer: Medicare HMO | Source: Ambulatory Visit | Attending: Radiation Oncology | Admitting: Radiation Oncology

## 2015-08-11 DIAGNOSIS — C7949 Secondary malignant neoplasm of other parts of nervous system: Principal | ICD-10-CM

## 2015-08-11 DIAGNOSIS — C7931 Secondary malignant neoplasm of brain: Secondary | ICD-10-CM

## 2015-08-11 MED ORDER — GADOBENATE DIMEGLUMINE 529 MG/ML IV SOLN
14.0000 mL | Freq: Once | INTRAVENOUS | Status: AC | PRN
  Administered 2015-08-11: 14 mL via INTRAVENOUS

## 2015-08-14 ENCOUNTER — Inpatient Hospital Stay: Admission: RE | Admit: 2015-08-14 | Payer: Medicare HMO | Source: Ambulatory Visit | Admitting: Radiation Oncology

## 2015-08-14 ENCOUNTER — Ambulatory Visit
Admission: RE | Admit: 2015-08-14 | Discharge: 2015-08-14 | Disposition: A | Discharge status: Home / Self Care | Payer: Medicare HMO | Source: Ambulatory Visit | Attending: Radiation Oncology | Admitting: Radiation Oncology

## 2015-08-14 ENCOUNTER — Telehealth: Payer: Self-pay | Admitting: Radiation Oncology

## 2015-08-14 NOTE — Telephone Encounter (Signed)
Phoned patient. No answer. Left message requesting return call. Patient resides with niece. Phoned patient's niece. She reports she was unaware of her aunt's appointment. She request to be rescheduled. She reports someone else took her aunt for her MRI that must be why she was unaware of the appointment. She requesting subsequent appointments be arranged for after 12 noon.

## 2015-08-14 NOTE — Telephone Encounter (Signed)
We could actually just let her know the MRI looks much better.  The treated spots in the brain have all improved.  There is one area which might actually be meningioma which looks a little enlarged, but, this is likely swelling from radiation.  Repeat brain MRI in 3 months and then follow-up  MM

## 2015-08-15 ENCOUNTER — Encounter: Payer: Self-pay | Admitting: Hematology and Oncology

## 2015-08-15 ENCOUNTER — Ambulatory Visit (HOSPITAL_BASED_OUTPATIENT_CLINIC_OR_DEPARTMENT_OTHER): Payer: Medicare HMO

## 2015-08-15 ENCOUNTER — Ambulatory Visit (HOSPITAL_BASED_OUTPATIENT_CLINIC_OR_DEPARTMENT_OTHER): Payer: Medicare HMO | Admitting: Hematology and Oncology

## 2015-08-15 ENCOUNTER — Telehealth: Payer: Self-pay | Admitting: Hematology and Oncology

## 2015-08-15 ENCOUNTER — Other Ambulatory Visit: Payer: Self-pay

## 2015-08-15 ENCOUNTER — Other Ambulatory Visit (HOSPITAL_BASED_OUTPATIENT_CLINIC_OR_DEPARTMENT_OTHER): Payer: Medicare HMO

## 2015-08-15 VITALS — BP 137/89 | HR 98 | Temp 98.0°F | Resp 19 | Ht 68.0 in | Wt 146.7 lb

## 2015-08-15 DIAGNOSIS — Z5111 Encounter for antineoplastic chemotherapy: Secondary | ICD-10-CM

## 2015-08-15 DIAGNOSIS — D649 Anemia, unspecified: Secondary | ICD-10-CM

## 2015-08-15 DIAGNOSIS — C50812 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: Secondary | ICD-10-CM | POA: Diagnosis not present

## 2015-08-15 DIAGNOSIS — C7931 Secondary malignant neoplasm of brain: Secondary | ICD-10-CM

## 2015-08-15 DIAGNOSIS — R5383 Other fatigue: Secondary | ICD-10-CM | POA: Diagnosis not present

## 2015-08-15 DIAGNOSIS — I639 Cerebral infarction, unspecified: Secondary | ICD-10-CM

## 2015-08-15 LAB — CBC WITH DIFFERENTIAL/PLATELET
BASO%: 1 % (ref 0.0–2.0)
BASOS ABS: 0.1 10*3/uL (ref 0.0–0.1)
EOS%: 0.4 % (ref 0.0–7.0)
Eosinophils Absolute: 0 10*3/uL (ref 0.0–0.5)
HEMATOCRIT: 33.4 % — AB (ref 34.8–46.6)
HGB: 11.1 g/dL — ABNORMAL LOW (ref 11.6–15.9)
LYMPH#: 1.5 10*3/uL (ref 0.9–3.3)
LYMPH%: 24.3 % (ref 14.0–49.7)
MCH: 26.7 pg (ref 25.1–34.0)
MCHC: 33.1 g/dL (ref 31.5–36.0)
MCV: 80.6 fL (ref 79.5–101.0)
MONO#: 0.1 10*3/uL (ref 0.1–0.9)
MONO%: 2.4 % (ref 0.0–14.0)
NEUT#: 4.4 10*3/uL (ref 1.5–6.5)
NEUT%: 71.9 % (ref 38.4–76.8)
PLATELETS: 439 10*3/uL — AB (ref 145–400)
RBC: 4.14 10*6/uL (ref 3.70–5.45)
RDW: 19.9 % — ABNORMAL HIGH (ref 11.2–14.5)
WBC: 6.1 10*3/uL (ref 3.9–10.3)

## 2015-08-15 LAB — COMPREHENSIVE METABOLIC PANEL
ALK PHOS: 55 U/L (ref 40–150)
ALT: 11 U/L (ref 0–55)
ANION GAP: 12 meq/L — AB (ref 3–11)
AST: 28 U/L (ref 5–34)
Albumin: 3.5 g/dL (ref 3.5–5.0)
BILIRUBIN TOTAL: 0.75 mg/dL (ref 0.20–1.20)
BUN: 11 mg/dL (ref 7.0–26.0)
CALCIUM: 10 mg/dL (ref 8.4–10.4)
CO2: 22 mEq/L (ref 22–29)
Chloride: 107 mEq/L (ref 98–109)
Creatinine: 1.2 mg/dL — ABNORMAL HIGH (ref 0.6–1.1)
EGFR: 56 mL/min/{1.73_m2} — AB (ref >90)
Glucose: 114 mg/dl (ref 70–140)
POTASSIUM: 3.7 meq/L (ref 3.5–5.1)
Sodium: 142 mEq/L (ref 136–145)
Total Protein: 7.7 g/dL (ref 6.4–8.3)

## 2015-08-15 MED ORDER — HEPARIN SOD (PORK) LOCK FLUSH 100 UNIT/ML IV SOLN
500.0000 [IU] | Freq: Once | INTRAVENOUS | Status: AC | PRN
  Administered 2015-08-15: 500 [IU]
  Filled 2015-08-15: qty 5

## 2015-08-15 MED ORDER — SODIUM CHLORIDE 0.9% FLUSH
10.0000 mL | INTRAVENOUS | Status: DC | PRN
  Administered 2015-08-15: 10 mL
  Filled 2015-08-15: qty 10

## 2015-08-15 MED ORDER — SODIUM CHLORIDE 0.9 % IV SOLN
Freq: Once | INTRAVENOUS | Status: AC
  Administered 2015-08-15: 16:00:00 via INTRAVENOUS

## 2015-08-15 MED ORDER — ERIBULIN MESYLATE CHEMO INJECTION 1 MG/2ML
1.4000 mg/m2 | Freq: Once | INTRAVENOUS | Status: AC
  Administered 2015-08-15: 2.5 mg via INTRAVENOUS
  Filled 2015-08-15: qty 5

## 2015-08-15 MED ORDER — PROCHLORPERAZINE MALEATE 10 MG PO TABS
ORAL_TABLET | ORAL | Status: AC
  Filled 2015-08-15: qty 1

## 2015-08-15 MED ORDER — PROCHLORPERAZINE MALEATE 10 MG PO TABS
10.0000 mg | ORAL_TABLET | Freq: Once | ORAL | Status: AC
  Administered 2015-08-15: 10 mg via ORAL

## 2015-08-15 NOTE — Progress Notes (Addendum)
Patient Care Team: Pcp Not In System as PCP - General  DIAGNOSIS: No matching staging information was found for the patient.  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 ([place])   06/02/2015 Imaging Necrotic left breast mass, small asymmetric left x-ray lymph nodes, 3 cm vegetation attached to the ascending aortic wall tumor versus thrombus versus infection, indeterminate liver (10 mm) and right adrenal (13 mm) masses   06/02/2015 Surgery Left mastectomy: IDC grade 3, 14.7 cm, involves skin with ulceration, LVH present including dermal lymphatics, margins negative, isolated tumor cells 2/4 lymph nodes T4b N0i+M1 (stage 4)   06/07/2015 Imaging MRI brain: Numerous bilateral cerebral metastases (largest left frontal 2.5 x 1.5 cm; Rt: 1.5x1 cm Rt Occip lobe, mild edema associated with these lesions but no mass effect, single small cerebellar met (0.5cm), 3.2 cm meningioma   07/18/2015 -  Chemotherapy Halaven days 1 and 8 every 3 weeks    CHIEF COMPLIANT: Cycle 2 day 8 Halaven  INTERVAL HISTORY: Robin Cantrell is a 70 year old with above-mentioned history metastatic breast cancer with brain metastases. She underwent right mastectomy and is currently on systemic chemotherapy. She appears to be tolerating chemotherapy extremely well. She does have fatigue, occasional nausea, 2 episodes of diarrhea.  REVIEW OF SYSTEMS:   Constitutional: Denies fevers, chills or abnormal weight loss, complains of fatigue Eyes: Denies blurriness of vision Ears, nose, mouth, throat, and face: Denies mucositis or sore throat Respiratory: Denies cough, dyspnea or wheezes Cardiovascular: Denies palpitation, chest discomfort Gastrointestinal:  2 episodes of diarrhea Skin: Denies abnormal skin rashes Lymphatics: Denies new lymphadenopathy or easy bruising Neurological:Denies numbness, tingling or new weaknesses Behavioral/Psych: Mood is stable, no new changes    Extremities: No lower extremity edema  All other systems were reviewed with the patient and are negative.  I have reviewed the past medical history, past surgical history, social history and family history with the patient and they are unchanged from previous note.  ALLERGIES:  has No Known Allergies.  MEDICATIONS:  Current Outpatient Prescriptions  Medication Sig Dispense Refill  . acetaminophen (TYLENOL) 325 MG tablet Take 650 mg by mouth every 6 (six) hours as needed for mild pain. Reported on 07/27/2015    . dexamethasone (DECADRON) 2 MG tablet Take 2 mg by mouth daily.    Marland Kitchen dexamethasone (DECADRON) 2 MG tablet Take by mouth. Reported on 07/27/2015    . diphenhydrAMINE (BENADRYL) 25 mg capsule Take 25 mg by mouth at bedtime as needed for sleep. Reported on 07/27/2015    . feeding supplement, ENSURE ENLIVE, (ENSURE ENLIVE) LIQD Take 237 mLs by mouth 2 (two) times daily between meals. Or alternative formulary at facility (Patient not taking: Reported on 07/27/2015) 237 mL 12  . hydrOXYzine (ATARAX/VISTARIL) 10 MG tablet Take 10 mg by mouth 3 (three) times daily as needed. Reported on 07/27/2015    . lidocaine-prilocaine (EMLA) cream Apply to affected area once (Patient not taking: Reported on 07/27/2015) 30 g 3  . LORazepam (ATIVAN) 0.5 MG tablet Take 1 tablet (0.5 mg total) by mouth every 6 (six) hours as needed (Nausea or vomiting). (Patient not taking: Reported on 07/27/2015) 30 tablet 0  . Lubricants (K-Y) LIQD Apply 1 application topically every evening. Reported on 07/27/2015    . ondansetron (ZOFRAN) 8 MG tablet Take 1 tablet (8 mg total) by mouth 2 (two) times daily as needed (Nausea or vomiting). (Patient not taking: Reported on 07/27/2015) 30 tablet 1  . oxyCODONE (OXY  IR/ROXICODONE) 5 MG immediate release tablet Take 5 mg by mouth every 4 (four) hours as needed for severe pain. Reported on 07/27/2015    . prochlorperazine (COMPAZINE) 10 MG tablet Take 1 tablet (10 mg total) by mouth every  6 (six) hours as needed (Nausea or vomiting). (Patient not taking: Reported on 07/27/2015) 30 tablet 1  . senna-docusate (SENOKOT-S) 8.6-50 MG tablet Take 2 tablets by mouth daily. Reported on 07/27/2015    . simvastatin (ZOCOR) 10 MG tablet Take 1 tablet (10 mg total) by mouth daily at 6 PM.    . simvastatin (ZOCOR) 10 MG tablet Take by mouth. Reported on 07/27/2015     No current facility-administered medications for this visit.    PHYSICAL EXAMINATION: ECOG PERFORMANCE STATUS: 1 - Symptomatic but completely ambulatory  Filed Vitals:   08/15/15 1450  BP: 137/89  Pulse: 98  Temp: 98 F (36.7 C)  Resp: 19   Filed Weights   08/15/15 1450  Weight: 146 lb 11.2 oz (66.543 kg)    GENERAL:alert, no distress and comfortable SKIN: skin color, texture, turgor are normal, no rashes or significant lesions EYES: normal, Conjunctiva are pink and non-injected, sclera clear OROPHARYNX:no exudate, no erythema and lips, buccal mucosa, and tongue normal  NECK: supple, thyroid normal size, non-tender, without nodularity LYMPH:  no palpable lymphadenopathy in the cervical, axillary or inguinal LUNGS: clear to auscultation and percussion with normal breathing effort HEART: regular rate & rhythm and no murmurs and no lower extremity edema ABDOMEN:abdomen soft, non-tender and normal bowel sounds MUSCULOSKELETAL:no cyanosis of digits and no clubbing  NEURO: alert & oriented x 3 with fluent speech, no focal motor/sensory deficits EXTREMITIES: No lower extremity edema  LABORATORY DATA:  I have reviewed the data as listed   Chemistry      Component Value Date/Time   NA 142 08/15/2015 1439   NA 140 06/26/2015 0831   K 3.7 08/15/2015 1439   K 3.9 06/26/2015 0831   CL 101 06/26/2015 0831   CO2 22 08/15/2015 1439   CO2 26 06/26/2015 0831   BUN 11.0 08/15/2015 1439   BUN 24* 06/26/2015 0831   CREATININE 1.2* 08/15/2015 1439   CREATININE 0.84 06/26/2015 0831      Component Value Date/Time    CALCIUM 10.0 08/15/2015 1439   CALCIUM 9.9 06/26/2015 0831   ALKPHOS 55 08/15/2015 1439   ALKPHOS 63 06/07/2015 0534   AST 28 08/15/2015 1439   AST 22 06/07/2015 0534   ALT 11 08/15/2015 1439   ALT 18 06/07/2015 0534   BILITOT 0.75 08/15/2015 1439   BILITOT 0.7 06/07/2015 0534      Lab Results  Component Value Date   WBC 6.1 08/15/2015   HGB 11.1* 08/15/2015   HCT 33.4* 08/15/2015   MCV 80.6 08/15/2015   PLT 439* 08/15/2015   NEUTROABS 4.4 08/15/2015   ASSESSMENT & PLAN:  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 Normocytic anemia: Multifactorial 2. Fatigue 3. 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 Since there was no systemic disease that is measurable, I do not intend to obtain any interim CT scans after 3 cycles.  Return to clinic in 2 weeks for toxicity check and for cycle 3 day 1.   No orders of the defined types were placed in this encounter.   The patient has a good understanding of the overall plan. she agrees with it. she will call with any problems that may develop before the next visit here.   Rulon Eisenmenger,  MD 08/15/2015  Addendum: There is a type mistake in the above note. It should state that the MRI brain done on 08/11/2015 showed regression of brain metastases (not progression)

## 2015-08-15 NOTE — Telephone Encounter (Signed)
Appointments per 4/18 pof complete. Patient called to infusion and is aware she is to get avs report/appointments in infusion area.

## 2015-08-15 NOTE — Patient Instructions (Signed)
[facility] Discharge Instructions for Patients Receiving Chemotherapy  Today you received the following chemotherapy agents halaven   To help prevent nausea and vomiting after your treatment, we encourage you to take your nausea medication as directed. If you develop nausea and vomiting that is not controlled by your nausea medication, call the clinic.   BELOW ARE SYMPTOMS THAT SHOULD BE REPORTED IMMEDIATELY:  *FEVER GREATER THAN 100.5 F  *CHILLS WITH OR WITHOUT FEVER  NAUSEA AND VOMITING THAT IS NOT CONTROLLED WITH YOUR NAUSEA MEDICATION  *UNUSUAL SHORTNESS OF BREATH  *UNUSUAL BRUISING OR BLEEDING  TENDERNESS IN MOUTH AND THROAT WITH OR WITHOUT PRESENCE OF ULCERS  *URINARY PROBLEMS  *BOWEL PROBLEMS  UNUSUAL RASH Items with * indicate a potential emergency and should be followed up as soon as possible.  Feel free to call the clinic you have any questions or concerns. The clinic phone number is [phone].  

## 2015-08-15 NOTE — Assessment & Plan Note (Signed)
Left mastectomy: IDC grade 3, 14.7 cm, involves skin with ulceration, LVI present including dermal lymphatics, margins negative, isolated tumor cells 2/4 lymph nodes T4b N0i+M1 (stage 4) CT chest abdomen pelvis 06/03/2015 did not reveal any other evidence of distant metastatic disease Status post whole brain radiation therapy 06/13/2015- 06/29/2015  Current treatment: Palliative chemotherapy with Halaven days 1 and 8 every 3 weeks today is cycle 2 day 8 Chemotherapy toxicities: Denies any nausea vomiting 1. Normocytic anemia: Multifactorial 2. Fatigue 3. 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 Since there was no systemic disease that is measurable, I do not intend to obtain any interim CT scans after 3 cycles.  Return to clinic in 2 weeks for toxicity check and for cycle 3 day 1.

## 2015-08-15 NOTE — Progress Notes (Signed)
Unable to get in to exam room prior to MD.  No assessment performed.  

## 2015-08-16 ENCOUNTER — Telehealth: Payer: Self-pay | Admitting: Radiation Therapy

## 2015-08-16 NOTE — Telephone Encounter (Signed)
I called to let Robin Cantrell know that her recent brain scan showed regression of disease and that we will follow-up in 3 months with another MRI and visit. There was no answer, so I left this message on her voicemail.   Mont Dutton R.T.(R).(T). Special Procedures Navigator

## 2015-08-17 ENCOUNTER — Telehealth: Payer: Self-pay

## 2015-08-17 NOTE — Telephone Encounter (Signed)
Santiago Glad an OT with [facility] was visiting pt for a discharge visit. Sister was asking for a RN visit to assess for UTI. Pt has increased incontinence and a mild increased confusion. VO given for RN home visit.

## 2015-08-24 ENCOUNTER — Telehealth: Payer: Self-pay

## 2015-08-24 NOTE — Telephone Encounter (Signed)
Call report rcvd from [place] 08/23/15.  I called pt niece Sonia Baller to check on the patient.  Ms. Idolina Primer reports she took patient to [facility] per [facility] instructions.  Pt was admitted for UTI and confusion.  She was started on IV Rocephin and is on the oncology unit.  MD was notified and CareEverywhere notes pulled for the admission encounter.    Per Ds. Dunn's request, I called her and provided name and phone number of plastic surgeon.

## 2015-08-25 ENCOUNTER — Telehealth: Payer: Self-pay

## 2015-08-25 NOTE — Telephone Encounter (Signed)
LMOVM - Ms. Dunn to return call to clinic.    Per Care Everywhere - pt still inpatient.  [place] expects to discharge tomorrow.  MD notified.

## 2015-08-28 ENCOUNTER — Other Ambulatory Visit: Payer: Self-pay | Admitting: *Deleted

## 2015-08-29 ENCOUNTER — Ambulatory Visit (HOSPITAL_BASED_OUTPATIENT_CLINIC_OR_DEPARTMENT_OTHER): Payer: Medicare HMO | Admitting: Hematology and Oncology

## 2015-08-29 ENCOUNTER — Encounter: Payer: Self-pay | Admitting: Hematology and Oncology

## 2015-08-29 ENCOUNTER — Ambulatory Visit: Payer: Medicare HMO

## 2015-08-29 ENCOUNTER — Telehealth: Payer: Self-pay | Admitting: Hematology and Oncology

## 2015-08-29 ENCOUNTER — Other Ambulatory Visit (HOSPITAL_BASED_OUTPATIENT_CLINIC_OR_DEPARTMENT_OTHER): Payer: Medicare HMO

## 2015-08-29 VITALS — BP 147/83 | HR 72 | Temp 98.1°F | Resp 17 | Ht 68.0 in | Wt 146.1 lb

## 2015-08-29 DIAGNOSIS — R410 Disorientation, unspecified: Secondary | ICD-10-CM | POA: Diagnosis not present

## 2015-08-29 DIAGNOSIS — C50812 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: Secondary | ICD-10-CM

## 2015-08-29 DIAGNOSIS — C7931 Secondary malignant neoplasm of brain: Secondary | ICD-10-CM | POA: Diagnosis not present

## 2015-08-29 DIAGNOSIS — D649 Anemia, unspecified: Secondary | ICD-10-CM | POA: Diagnosis not present

## 2015-08-29 LAB — COMPREHENSIVE METABOLIC PANEL
ALBUMIN: 3.4 g/dL — AB (ref 3.5–5.0)
ALT: 20 U/L (ref 0–55)
AST: 32 U/L (ref 5–34)
Alkaline Phosphatase: 62 U/L (ref 40–150)
Anion Gap: 11 mEq/L (ref 3–11)
BUN: 9.8 mg/dL (ref 7.0–26.0)
CALCIUM: 9.4 mg/dL (ref 8.4–10.4)
CHLORIDE: 109 meq/L (ref 98–109)
CO2: 23 mEq/L (ref 22–29)
CREATININE: 1.3 mg/dL — AB (ref 0.6–1.1)
EGFR: 44 mL/min/{1.73_m2} — ABNORMAL LOW (ref >90)
Glucose: 87 mg/dl (ref 70–140)
Potassium: 3.5 mEq/L (ref 3.5–5.1)
Sodium: 143 mEq/L (ref 136–145)
Total Bilirubin: 0.34 mg/dL (ref 0.20–1.20)
Total Protein: 7.2 g/dL (ref 6.4–8.3)

## 2015-08-29 LAB — CBC WITH DIFFERENTIAL/PLATELET
BASO%: 0.5 % (ref 0.0–2.0)
Basophils Absolute: 0 10*3/uL (ref 0.0–0.1)
EOS%: 0 % (ref 0.0–7.0)
Eosinophils Absolute: 0 10*3/uL (ref 0.0–0.5)
HEMATOCRIT: 33.3 % — AB (ref 34.8–46.6)
HEMOGLOBIN: 10.9 g/dL — AB (ref 11.6–15.9)
LYMPH#: 1.6 10*3/uL (ref 0.9–3.3)
LYMPH%: 21.8 % (ref 14.0–49.7)
MCH: 27.3 pg (ref 25.1–34.0)
MCHC: 32.7 g/dL (ref 31.5–36.0)
MCV: 83.5 fL (ref 79.5–101.0)
MONO#: 0.7 10*3/uL (ref 0.1–0.9)
MONO%: 9.9 % (ref 0.0–14.0)
NEUT%: 67.8 % (ref 38.4–76.8)
NEUTROS ABS: 5.1 10*3/uL (ref 1.5–6.5)
Platelets: 372 10*3/uL (ref 145–400)
RBC: 3.99 10*6/uL (ref 3.70–5.45)
RDW: 18.1 % — AB (ref 11.2–14.5)
WBC: 7.5 10*3/uL (ref 3.9–10.3)

## 2015-08-29 NOTE — Assessment & Plan Note (Addendum)
Left mastectomy: IDC grade 3, 14.7 cm, involves skin with ulceration, LVI present including dermal lymphatics, margins negative, isolated tumor cells 2/4 lymph nodes T4b N0i+M1 (stage 4) ER 0%, PR 0%, HER-2 negative CT chest abdomen pelvis 06/03/2015 did not reveal any other evidence of distant metastatic disease Status post whole brain radiation therapy 06/13/2015- 06/29/2015  Current treatment: Palliative chemotherapy with Halaven days 1 and 8 every 3 weeks started 07/18/2015, completed 2 cycles as of 08/16/2015, discontinue chemotherapy because of poor tolerance and hospitalizations for UTI and diarrhea  Chemotherapy toxicities: Denies any nausea vomiting 1. Normocytic anemia: Multifactorial 2. Fatigue 3. Decreased appetite 4. Hospitalization for UTI 5. Diarrhea 7. Confusion and declining performance status and frailty  Because of all of the side effects, we decided to discontinue chemotherapy for the time being. 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 We can decide at that time if you need to reinstitute chemotherapy there is no role of antiestrogen therapy since she is ER/PR negative.

## 2015-08-29 NOTE — Telephone Encounter (Signed)
appt made and avs printed. Ct to be scheduled by central radiology. Barium given to pt

## 2015-08-29 NOTE — Progress Notes (Signed)
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 ([place])   06/02/2015 Imaging Necrotic left breast mass, small asymmetric left x-ray lymph nodes, 3 cm vegetation attached to the ascending aortic wall tumor versus thrombus versus infection, indeterminate liver (10 mm) and right adrenal (13 mm) masses   06/02/2015 Surgery Left mastectomy: IDC grade 3, 14.7 cm, involves skin with ulceration, LVH present including dermal lymphatics, margins negative, isolated tumor cells 2/4 lymph nodes T4b N0i+M1 (stage 4)   06/07/2015 Imaging MRI brain: Numerous bilateral cerebral metastases (largest left frontal 2.5 x 1.5 cm; Rt: 1.5x1 cm Rt Occip lobe, mild edema associated with these lesions but no mass effect, single small cerebellar met (0.5cm), 3.2 cm meningioma   07/18/2015 - 08/14/2015 Chemotherapy Halaven days 1 and 8 every 3 weeks, stopped early due to hospitalizations and infections    CHIEF COMPLIANT: recent hospitalization for UTI  INTERVAL HISTORY: Robin Cantrell is a 70 year old with above-mentioned history of left breast cancer who was on halaven chemotherapy. She was hospitalized recently for urinary tract infection and just was discharged from the hospital. She comes in today with her caregiver who tells me that she is getting more and more confused. She appears to have advanced dementia. She is hoping to get a break from chemotherapy.  REVIEW OF SYSTEMS:   Constitutional: Denies fevers, chills or abnormal weight loss Eyes: Denies blurriness of vision Ears, nose, mouth, throat, and face: Denies mucositis or sore throat Respiratory: Denies cough, dyspnea or wheezes Cardiovascular: Denies palpitation, chest discomfort Gastrointestinal:  Denies nausea, heartburn or change in bowel habits Skin: Denies abnormal skin rashes Lymphatics: Denies new lymphadenopathy or easy bruising Neurological:mild  to moderate confusion, disorientation, fatigue and primarily memory problems recently she was boiling sugar thinking that she was making grits Behavioral/Psych: Mood is stable, no new changes  Extremities: No lower extremity edema  All other systems were reviewed with the patient and are negative.  I have reviewed the past medical history, past surgical history, social history and family history with the patient and they are unchanged from previous note.  ALLERGIES:  has No Known Allergies.  MEDICATIONS:  Current Outpatient Prescriptions  Medication Sig Dispense Refill  . cefdinir (OMNICEF) 300 MG capsule TK ONE C PO  BID FOR 10 DAYS  0  . feeding supplement, ENSURE ENLIVE, (ENSURE ENLIVE) LIQD Take 237 mLs by mouth 2 (two) times daily between meals. Or alternative formulary at facility (Patient not taking: Reported on 07/27/2015) 237 mL 12  . lidocaine-prilocaine (EMLA) cream Apply to affected area once (Patient not taking: Reported on 07/27/2015) 30 g 3  . LORazepam (ATIVAN) 0.5 MG tablet Take 1 tablet (0.5 mg total) by mouth every 6 (six) hours as needed (Nausea or vomiting). (Patient not taking: Reported on 07/27/2015) 30 tablet 0  . ondansetron (ZOFRAN) 8 MG tablet Take 1 tablet (8 mg total) by mouth 2 (two) times daily as needed (Nausea or vomiting). (Patient not taking: Reported on 07/27/2015) 30 tablet 1  . prochlorperazine (COMPAZINE) 10 MG tablet Take 1 tablet (10 mg total) by mouth every 6 (six) hours as needed (Nausea or vomiting). (Patient not taking: Reported on 07/27/2015) 30 tablet 1  . simvastatin (ZOCOR) 10 MG tablet Take by mouth. Reported on 07/27/2015     No current facility-administered medications for this visit.    PHYSICAL EXAMINATION: ECOG PERFORMANCE STATUS: 1 - Symptomatic but  completely ambulatory  Filed Vitals:   08/29/15 1529  BP: 147/83  Pulse: 72  Temp: 98.1 F (36.7 C)  Resp: 17   Filed Weights   08/29/15 1529  Weight: 146 lb 1.6 oz (66.271 kg)     GENERAL:alert, no distress and comfortable SKIN: skin color, texture, turgor are normal, no rashes or significant lesions EYES: normal, Conjunctiva are pink and non-injected, sclera clear OROPHARYNX:no exudate, no erythema and lips, buccal mucosa, and tongue normal  NECK: supple, thyroid normal size, non-tender, without nodularity LYMPH:  no palpable lymphadenopathy in the cervical, axillary or inguinal LUNGS: clear to auscultation and percussion with normal breathing effort HEART: regular rate & rhythm and no murmurs and no lower extremity edema ABDOMEN:abdomen soft, non-tender and normal bowel sounds MUSCULOSKELETAL:no cyanosis of digits and no clubbing  NEURO:patient appears to answer questions appropriately but tells me that she is very forgetful EXTREMITIES: No lower extremity edema  LABORATORY DATA:  I have reviewed the data as listed   Chemistry      Component Value Date/Time   NA 143 08/29/2015 1513   NA 140 06/26/2015 0831   K 3.5 08/29/2015 1513   K 3.9 06/26/2015 0831   CL 101 06/26/2015 0831   CO2 23 08/29/2015 1513   CO2 26 06/26/2015 0831   BUN 9.8 08/29/2015 1513   BUN 24* 06/26/2015 0831   CREATININE 1.3* 08/29/2015 1513   CREATININE 0.84 06/26/2015 0831      Component Value Date/Time   CALCIUM 9.4 08/29/2015 1513   CALCIUM 9.9 06/26/2015 0831   ALKPHOS 62 08/29/2015 1513   ALKPHOS 63 06/07/2015 0534   AST 32 08/29/2015 1513   AST 22 06/07/2015 0534   ALT 20 08/29/2015 1513   ALT 18 06/07/2015 0534   BILITOT 0.34 08/29/2015 1513   BILITOT 0.7 06/07/2015 0534       Lab Results  Component Value Date   WBC 7.5 08/29/2015   HGB 10.9* 08/29/2015   HCT 33.3* 08/29/2015   MCV 83.5 08/29/2015   PLT 372 08/29/2015   NEUTROABS 5.1 08/29/2015   ASSESSMENT & PLAN:  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 ([facility]) Left mastectomy: IDC grade 3, 14.7 cm, involves skin with ulceration, LVI present including dermal lymphatics, margins  negative, isolated tumor cells 2/4 lymph nodes T4b N0i+M1 (stage 4) ER 0%, PR 0%, HER-2 negative CT chest abdomen pelvis 06/03/2015 did not reveal any other evidence of distant metastatic disease Status post whole brain radiation therapy 06/13/2015- 06/29/2015  Current treatment: Palliative chemotherapy with Halaven days 1 and 8 every 3 weeks started 07/18/2015, completed 2 cycles as of 08/16/2015, discontinue chemotherapy because of poor tolerance and hospitalizations for UTI and diarrhea  Chemotherapy toxicities: Denies any nausea vomiting 1. Normocytic anemia: Multifactorial 2. Fatigue 3. Decreased appetite 4. Hospitalization for UTI 5. Diarrhea 7. Confusion and declining performance status and frailty  Because of all of the side effects, we decided to discontinue chemotherapy for the time being. 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 We can decide at that time if you need to reinstitute chemotherapy there is no role of antiestrogen therapy since she is ER/PR negative.     Orders Placed This Encounter  Procedures  . CT Abdomen Pelvis W Contrast    Standing Status: Future  Number of Occurrences:      Standing Expiration Date: 08/28/2016    Order Specific Question:  If indicated for the ordered procedure, I authorize the administration of contrast media per Radiology protocol    Answer:  Yes    Order Specific Question:  Reason for Exam (SYMPTOM  OR DIAGNOSIS REQUIRED)    Answer:  Metastatic breast cancer restaging    Order Specific Question:  Preferred imaging location?    Answer:  [facility]  . CT Chest W Contrast    Standing Status: Future     Number of Occurrences:      Standing Expiration Date: 08/28/2016    Order Specific Question:  If  indicated for the ordered procedure, I authorize the administration of contrast media per Radiology protocol    Answer:  Yes    Order Specific Question:  Reason for Exam (SYMPTOM  OR DIAGNOSIS REQUIRED)    Answer:  Metastatic breast cancer restaging    Order Specific Question:  Preferred imaging location?    Answer:  [facility]   The patient has a good understanding of the overall plan. she agrees with it. she will call with any problems that may develop before the next visit here.   Rulon Eisenmenger, MD 08/29/2015

## 2015-09-01 ENCOUNTER — Telehealth: Payer: Self-pay | Admitting: *Deleted

## 2015-09-01 NOTE — Telephone Encounter (Signed)
Called and spoke with [place] with [facility]. She received information from June Fawcett-Hardy,NP who saw patient at the transitional care clinic in [facility] yesterday. Niece wants patient to receive home health and have a CNA. Patient does not have a skilled need related to home health. Patient does have Medicaid and it will pay for CNA but may take time. Jacqlyn Larsen stated that she referred niece to their assisted care office and will start the process for CNA. Told Becky that Dr. Lindi Adie is willing to be attending and sign orders. Advised to call us for any needs.

## 2015-09-01 NOTE — Telephone Encounter (Signed)
Call from [place] with [facility] about home health for patient. Sent inbasket to [place] with Social work to see if patient will qualify for this.

## 2015-09-04 ENCOUNTER — Encounter: Payer: Self-pay | Admitting: Radiation Oncology

## 2015-09-04 ENCOUNTER — Ambulatory Visit
Admission: RE | Admit: 2015-09-04 | Discharge: 2015-09-04 | Disposition: A | Discharge status: Home / Self Care | Payer: Medicare HMO | Source: Ambulatory Visit | Attending: Radiation Oncology | Admitting: Radiation Oncology

## 2015-09-04 VITALS — BP 126/89 | HR 95 | Temp 97.3°F | Resp 16 | Wt 143.5 lb

## 2015-09-04 DIAGNOSIS — C7931 Secondary malignant neoplasm of brain: Secondary | ICD-10-CM

## 2015-09-04 NOTE — Progress Notes (Signed)
Weight and vitals stable. Denies pain. Denies taking decadron at this time. Hyperpigmentation of forehead noted. Encouraged patient to apply lotion daily to this area. Patient reports floaters continue. Denies headache, dizziness, nausea, vomiting, or ringing in the ears. Unsteady gait noted. Denies any seizure activity. Reports she is very fatigued and sleeps often. Reports poor short term memory. Reports she is easily confused. Niece reports she has noted a increase in her altered mental status. Reports increased incontinence of bowel and bladder.   BP 126/89 mmHg  Pulse 95  Temp(Src) 97.3 F (36.3 C) (Oral)  Resp 16  Wt 143 lb 8 oz (65.091 kg)  SpO2 100% Wt Readings from Last 3 Encounters:  09/04/15 143 lb 8 oz (65.091 kg)  08/29/15 146 lb 1.6 oz (66.271 kg)  08/15/15 146 lb 11.2 oz (66.543 kg)

## 2015-09-05 ENCOUNTER — Ambulatory Visit: Payer: Medicare HMO

## 2015-09-05 NOTE — Progress Notes (Signed)
Radiation Oncology         [phone]) [phone] ________________________________  Name: Robin Cantrell MRN: [id number]  Date: 09/04/2015  DOB: 28-Sep-1945  Follow-Up Visit Note  CC: Pcp Not In System  [place], Mathis Dad, MD   Diagnosis:   70 yo woman with multiple brain mets from locally advanced breast cancer    ICD-9-CM ICD-10-CM   1. Brain metastasis (HCC) 198.3 C79.31     Interval Since Last Radiation:  2 month s/p whole brain RT  Narrative:  The patient returns today for routine follow-up.  The patient returns today to discuss recent scan results.  Her case is complex and this has led to some confusion regarding the status of her brain mets.  Specifically, she had innumerable brain mets from breast cancer as well as a dural based right temporal convexity meningioma.  She received whole brain RT for her brain mets, and they have responded favorably with shrinkage and resolution.  As is common with meningioma, the convexity lesion is minimally larger in response to radiation.  To complicate matters, the patient was admitted to another facility with altered mental status from UTI.  Head CT and MRI there confirm our findings that her meningioma has enlarged.  In the absence of her neuro-radiology and neurosurgery evaluations in our brain conference, the interpretation of her new scans was that her intracranial breast cancer has progressed.  She was upset and confused by the mixed messages.                              ALLERGIES:  has No Known Allergies.  Meds: Current Outpatient Prescriptions  Medication Sig Dispense Refill  . cefdinir (OMNICEF) 300 MG capsule TK ONE C PO  BID FOR 10 DAYS  0  . simvastatin (ZOCOR) 10 MG tablet Take by mouth. Reported on 07/27/2015    . cefdinir (OMNICEF) 300 MG capsule Take 300 mg by mouth. Reported on 09/04/2015    . feeding supplement, ENSURE ENLIVE, (ENSURE ENLIVE) LIQD Take 237 mLs by mouth 2 (two) times daily between meals. Or alternative formulary at  facility (Patient not taking: Reported on 07/27/2015) 237 mL 12  . lidocaine-prilocaine (EMLA) cream Apply to affected area once (Patient not taking: Reported on 07/27/2015) 30 g 3  . LORazepam (ATIVAN) 0.5 MG tablet Take 1 tablet (0.5 mg total) by mouth every 6 (six) hours as needed (Nausea or vomiting). (Patient not taking: Reported on 07/27/2015) 30 tablet 0  . ondansetron (ZOFRAN) 8 MG tablet Take 1 tablet (8 mg total) by mouth 2 (two) times daily as needed (Nausea or vomiting). (Patient not taking: Reported on 07/27/2015) 30 tablet 1  . prochlorperazine (COMPAZINE) 10 MG tablet Take 1 tablet (10 mg total) by mouth every 6 (six) hours as needed (Nausea or vomiting). (Patient not taking: Reported on 07/27/2015) 30 tablet 1  . simvastatin (ZOCOR) 10 MG tablet Take 10 mg by mouth. Reported on 09/04/2015     No current facility-administered medications for this encounter.    Physical Findings: The patient is in no acute distress. Patient is alert and oriented.  weight is 143 lb 8 oz (65.091 kg). Her oral temperature is 97.3 F (36.3 C). Her blood pressure is 126/89 and her pulse is 95. Her respiration is 16 and oxygen saturation is 100%. .  No significant changes.  Lab Findings: Lab Results  Component Value Date   WBC 7.5 08/29/2015   WBC 18.7* 06/26/2015  HGB 10.9* 08/29/2015   HGB 11.4* 06/26/2015   HCT 33.3* 08/29/2015   HCT 36.6 06/26/2015   HCT 19.4* 06/03/2015   PLT 372 08/29/2015   PLT 620* 06/26/2015    Lab Results  Component Value Date   NA 143 08/29/2015   NA 140 06/26/2015   K 3.5 08/29/2015   K 3.9 06/26/2015   CHLORIDE 109 08/29/2015   CO2 23 08/29/2015   CO2 26 06/26/2015   GLUCOSE 87 08/29/2015   GLUCOSE 88 06/26/2015   BUN 9.8 08/29/2015   BUN 24* 06/26/2015   CREATININE 1.3* 08/29/2015   CREATININE 0.84 06/26/2015   BILITOT 0.34 08/29/2015   BILITOT 0.7 06/07/2015   ALKPHOS 62 08/29/2015   ALKPHOS 63 06/07/2015   AST 32 08/29/2015   AST 22 06/07/2015   ALT  20 08/29/2015   ALT 18 06/07/2015   PROT 7.2 08/29/2015   PROT 5.1* 06/07/2015   ALBUMIN 3.4* 08/29/2015   ALBUMIN 1.8* 06/07/2015   CALCIUM 9.4 08/29/2015   CALCIUM 9.9 06/26/2015   ANIONGAP 11 08/29/2015   ANIONGAP 13 06/26/2015    Radiographic Findings: Mr Jeri Cos Wo Contrast  08/11/2015  CLINICAL DATA:  70 year old female with 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 The patient experienced acute onset left visual field loss on 06/10/2015 Restaging. Subsequent encounter. EXAM: MRI HEAD WITHOUT AND WITH CONTRAST TECHNIQUE: Multiplanar, multiecho pulse sequences of the brain and surrounding structures were obtained without and with intravenous contrast. CONTRAST:  69mL MULTIHANCE GADOBENATE DIMEGLUMINE 529 MG/ML IV SOLN COMPARISON:  06/07/2015 pre treatment brain MRI. Noncontrast head CT 06/10/2015 FINDINGS: The 3 cm right operculum extra-axial enhancing mass is Stable 2 slightly larger than in February, now measuring up to 16 mm in thickness (versus 14 mm previously). Regional mass effect has not significantly changed. Other extensive bilateral mostly cortically based metastases have regressed following whole brain radiation. Many of the largest were plaque-like lesions which now demonstrate only subtle residual cortical enhancement. All of the previously identified round more centrally located parenchymal metastases have also all regressed. Of these, a round metastasis in the inferior left frontal gyrus on series 12, image 75 has decreased the least, 6-7 mm today versus 8-9 mm on the pretreatment exam. The solitary medial left cerebellar tonsil lesion also is slightly decreased. There is new 3-4 cm confluent gyral and subcortical enhancement in the right occipital pole accompanied by petechial hemorrhage and evidence of some developing encephalomalacia. This appears to correspond to the right occipital pole abnormality  on 06/10/2015 noncontrast CT thought to represent acute cortical infarct at that time. Aside from the right operculum no dural thickening is identified. Outside of the right occipital pole changes scattered cerebral edema and mild regional mass effect appears regressed. Superimposed more confluent cerebral white matter and deep gray matter nuclei T2 heterogeneity which may be sequelae of XRT. Superimposed chronic lacunar infarcts in the deep gray matter and cerebellum indicating a degree of underlying chronic ischemic disease. Major intracranial vascular flow voids are stable. No restricted diffusion today suggestive of new infarct. Some of the metastases demonstrate mild ridge restriction in keeping with hypercellularity. There are scattered chronic micro hemorrhages, some of which are associated with the metastatic disease and otherwise are likely small vessel related. Negative cervicomedullary junction, visualized cervical spine and spinal cord. Negative pituitary. Stable ventricle size and configuration. Improved calvarial bone marrow signal following radiation. No suspicious osseous lesion. Visible internal auditory structures  appear normal. Mild mastoid fluid has not significantly changed. Negative nasopharynx. Trace paranasal sinus mucosal thickening. Negative orbit and scalp soft tissues. IMPRESSION: 1. Widespread cerebral metastatic disease has regressed following whole brain radiation, except for a 3 cm right operculum dural based lesion which appears stable to slightly larger. While this might represent a meningioma, I favor a dural based metastasis given the extent of disease elsewhere. 2. Right occipital lobe PCA territory infarct with petechial hemorrhage and post ischemic enhancement corresponding to the finding on 06/10/2015. Electronically Signed   By: Genevie Ann M.D.   On: 08/11/2015 10:48    Impression:  The patient is recovering from the effects of radiation.  She does have some fatigue and  somnolence following whole brain radiotherapy.  Her MRI shows improvement in treated brain mets.  Her right temporal convexity lesion which has all of the hallmarks of grade I meningioma (dural based, uniformly enhancing, no related edema) has shown minimal growth which is also consistent with post-radiation inflammation of meningioma.  Plan:  We discussed the concerns outlined above.  I reviewed the imaging with the patient and her niece.  I outlined our findings and interpretation.  I explained that the convexity lesion appears to be a grade I benign meningioma.  However, I did explain that without resection, we cannot be certain, underlining the importance of ongoing surveillance.  MRI for follow-up in 3 months.  _____________________________________  Sheral Apley. Tammi Klippel, M.D.

## 2015-09-06 ENCOUNTER — Telehealth: Payer: Self-pay | Admitting: Radiation Oncology

## 2015-09-06 NOTE — Telephone Encounter (Signed)
As requested by patient faxed 09/05/15 office note and 08/11/15 MRI to [facility]. Confirmation fax of delivery obtained.

## 2015-09-08 ENCOUNTER — Encounter: Payer: Self-pay | Admitting: Hematology and Oncology

## 2015-09-08 NOTE — Progress Notes (Signed)
left for dr. Lindi Adie to sign

## 2015-09-11 ENCOUNTER — Encounter: Payer: Self-pay | Admitting: Hematology and Oncology

## 2015-09-11 NOTE — Progress Notes (Signed)
left for dr. Lindi Adie to sign- faxed form and copy for patient as well as sent to medical recrds

## 2015-09-19 ENCOUNTER — Ambulatory Visit: Payer: Medicare HMO

## 2015-09-26 ENCOUNTER — Ambulatory Visit: Payer: Medicare HMO

## 2015-10-04 ENCOUNTER — Telehealth: Payer: Self-pay | Admitting: Hematology and Oncology

## 2015-10-04 NOTE — Telephone Encounter (Signed)
Tried to contact pt 3 times with no response mailed pt letter to call Radiology Centralized Scheduling.

## 2015-10-11 ENCOUNTER — Encounter: Payer: Self-pay | Admitting: Hematology and Oncology

## 2015-10-11 NOTE — Progress Notes (Signed)
dr Lindi Adie already signed- will fax

## 2015-10-17 ENCOUNTER — Emergency Department (HOSPITAL_COMMUNITY): Payer: Medicare HMO

## 2015-10-17 ENCOUNTER — Other Ambulatory Visit: Payer: Self-pay

## 2015-10-17 ENCOUNTER — Encounter (HOSPITAL_COMMUNITY): Payer: Self-pay

## 2015-10-17 ENCOUNTER — Inpatient Hospital Stay (HOSPITAL_COMMUNITY)
Admission: EM | Admit: 2015-10-17 | Discharge: 2015-10-20 | DRG: 054 | Disposition: A | Discharge status: Hospice | Payer: Medicare HMO | Attending: Internal Medicine | Admitting: Internal Medicine

## 2015-10-17 ENCOUNTER — Telehealth: Payer: Self-pay | Admitting: *Deleted

## 2015-10-17 DIAGNOSIS — Z79899 Other long term (current) drug therapy: Secondary | ICD-10-CM

## 2015-10-17 DIAGNOSIS — R531 Weakness: Secondary | ICD-10-CM | POA: Diagnosis not present

## 2015-10-17 DIAGNOSIS — Z6821 Body mass index (BMI) 21.0-21.9, adult: Secondary | ICD-10-CM

## 2015-10-17 DIAGNOSIS — R296 Repeated falls: Secondary | ICD-10-CM | POA: Diagnosis present

## 2015-10-17 DIAGNOSIS — G834 Cauda equina syndrome: Secondary | ICD-10-CM | POA: Diagnosis present

## 2015-10-17 DIAGNOSIS — R159 Full incontinence of feces: Secondary | ICD-10-CM | POA: Diagnosis present

## 2015-10-17 DIAGNOSIS — Z853 Personal history of malignant neoplasm of breast: Secondary | ICD-10-CM

## 2015-10-17 DIAGNOSIS — C7931 Secondary malignant neoplasm of brain: Secondary | ICD-10-CM | POA: Diagnosis present

## 2015-10-17 DIAGNOSIS — Z9221 Personal history of antineoplastic chemotherapy: Secondary | ICD-10-CM

## 2015-10-17 DIAGNOSIS — I10 Essential (primary) hypertension: Secondary | ICD-10-CM | POA: Diagnosis present

## 2015-10-17 DIAGNOSIS — Z923 Personal history of irradiation: Secondary | ICD-10-CM

## 2015-10-17 DIAGNOSIS — C7949 Secondary malignant neoplasm of other parts of nervous system: Principal | ICD-10-CM | POA: Diagnosis present

## 2015-10-17 DIAGNOSIS — Z7982 Long term (current) use of aspirin: Secondary | ICD-10-CM

## 2015-10-17 DIAGNOSIS — C50812 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: Secondary | ICD-10-CM | POA: Diagnosis present

## 2015-10-17 DIAGNOSIS — Z7189 Other specified counseling: Secondary | ICD-10-CM | POA: Insufficient documentation

## 2015-10-17 DIAGNOSIS — E43 Unspecified severe protein-calorie malnutrition: Secondary | ICD-10-CM | POA: Diagnosis present

## 2015-10-17 DIAGNOSIS — R413 Other amnesia: Secondary | ICD-10-CM | POA: Diagnosis present

## 2015-10-17 DIAGNOSIS — R32 Unspecified urinary incontinence: Secondary | ICD-10-CM | POA: Diagnosis present

## 2015-10-17 DIAGNOSIS — D630 Anemia in neoplastic disease: Secondary | ICD-10-CM | POA: Diagnosis present

## 2015-10-17 DIAGNOSIS — Z66 Do not resuscitate: Secondary | ICD-10-CM | POA: Diagnosis present

## 2015-10-17 DIAGNOSIS — Z515 Encounter for palliative care: Secondary | ICD-10-CM | POA: Insufficient documentation

## 2015-10-17 DIAGNOSIS — Z8673 Personal history of transient ischemic attack (TIA), and cerebral infarction without residual deficits: Secondary | ICD-10-CM

## 2015-10-17 DIAGNOSIS — Z9012 Acquired absence of left breast and nipple: Secondary | ICD-10-CM

## 2015-10-17 DIAGNOSIS — Z87891 Personal history of nicotine dependence: Secondary | ICD-10-CM

## 2015-10-17 DIAGNOSIS — C7951 Secondary malignant neoplasm of bone: Secondary | ICD-10-CM | POA: Diagnosis present

## 2015-10-17 LAB — CBC
HCT: 26.9 % — ABNORMAL LOW (ref 36.0–46.0)
Hemoglobin: 8.7 g/dL — ABNORMAL LOW (ref 12.0–15.0)
MCH: 27.1 pg (ref 26.0–34.0)
MCHC: 32.3 g/dL (ref 30.0–36.0)
MCV: 83.8 fL (ref 78.0–100.0)
PLATELETS: 574 10*3/uL — AB (ref 150–400)
RBC: 3.21 MIL/uL — AB (ref 3.87–5.11)
RDW: 14.6 % (ref 11.5–15.5)
WBC: 11.6 10*3/uL — ABNORMAL HIGH (ref 4.0–10.5)

## 2015-10-17 LAB — URINALYSIS, ROUTINE W REFLEX MICROSCOPIC
Bilirubin Urine: NEGATIVE
GLUCOSE, UA: NEGATIVE mg/dL
HGB URINE DIPSTICK: NEGATIVE
Ketones, ur: NEGATIVE mg/dL
Leukocytes, UA: NEGATIVE
Nitrite: NEGATIVE
PROTEIN: NEGATIVE mg/dL
Specific Gravity, Urine: 1.023 (ref 1.005–1.030)
pH: 5.5 (ref 5.0–8.0)

## 2015-10-17 LAB — BASIC METABOLIC PANEL
Anion gap: 10 (ref 5–15)
BUN: 16 mg/dL (ref 6–20)
CHLORIDE: 107 mmol/L (ref 101–111)
CO2: 19 mmol/L — AB (ref 22–32)
CREATININE: 0.91 mg/dL (ref 0.44–1.00)
Calcium: 9.4 mg/dL (ref 8.9–10.3)
GFR calc Af Amer: >60 mL/min (ref >60)
GFR calc non Af Amer: >60 mL/min (ref >60)
GLUCOSE: 97 mg/dL (ref 65–99)
Potassium: 4.2 mmol/L (ref 3.5–5.1)
Sodium: 136 mmol/L (ref 135–145)

## 2015-10-17 MED ORDER — SODIUM CHLORIDE 0.9 % IV SOLN: Freq: Once | INTRAVENOUS | Status: DC

## 2015-10-17 MED ORDER — LIDOCAINE-PRILOCAINE 2.5-2.5 % EX CREA
TOPICAL_CREAM | Freq: Once | CUTANEOUS | Status: AC
  Administered 2015-10-18: 01:00:00 via TOPICAL
  Filled 2015-10-17: qty 5

## 2015-10-17 NOTE — ED Notes (Signed)
Pt taken to chest x-ray

## 2015-10-17 NOTE — ED Notes (Signed)
IV team at bedside to access port  

## 2015-10-17 NOTE — ED Notes (Signed)
IV team attempted to access port; pt requesting port to be numbed prior to accessing

## 2015-10-17 NOTE — Telephone Encounter (Signed)
Received call from pt's niece, Ms Adam Phenix that pt has not had CT scheduled & pt has had a drastic change & can barely walk, is confused, & incontinent of bowel & bladder.  She has fallen multiple times.  Referred to to Central Scheduling to get CT scheduled & message forwarded to Dr Lindi Adie & POD RN.

## 2015-10-17 NOTE — Progress Notes (Signed)
Pt refused VAST nurse to access port, requesting to have area numbed. This nurse notified primary nurse to numb area and put new consult in for VAST nurse if needed for access. Fran Lowes, RN VAST

## 2015-10-17 NOTE — ED Notes (Signed)
Pt brought in by EMS for increased weakness since Sunday.  Pt is cancer pt and had recent mastectomy to the left side.  Pt also has brain tumor that "they have just been watching."  Pt reports her weakness has increased today and PMD requesting pt have MRI.  Pt denies any incontinence.

## 2015-10-17 NOTE — ED Provider Notes (Signed)
CSN: [id number]     Arrival date & time 10/17/15  1812 History   First MD Initiated Contact with Patient 10/17/15 1836     Chief Complaint  Patient presents with  . Weakness   HPI Patient presents with concerns of weakness since Sunday. Per patient and family, patient has had cancer and mastectomy to the left side was questionable suspected metastatic disease to the brain. She has not had chemotherapy since April. However, today, due to increased weakness bilaterally of the lower extremities, there was concern for spinal compression. These apparently called provider via phone and stated the patient had had new bowel and bladder incontinence with recurrent falls. Due to this they sent her to the emergency department for an MRI. Patient was not evaluated in person however. There has not been any head trauma and patient denies any pain with ambulation. She denies any back pain. No fevers or chills. No dysuria. No hematuria. Per niece, patient is supposed to be walking with a walker but refuses to do so. She has fallen twice but has not hit her head and is not on any anticoagulation. Patient denies any pain after the falls and knee states that she did not injure herself. Of note, her niece, patients mental status is at baseline. There is no new confusion.  Past Medical History  Diagnosis Date  . Stroke ([place]) 06/10/15    Acute Ischemic attack  . Cancer [facility])     mestatic breast   Past Surgical History  Procedure Laterality Date  . Ankle fracture surgery    . Total mastectomy Left 06/02/2015    Procedure: LEFT TOTAL MASTECTOMY;  Surgeon: Donnie Mesa, MD;  Location: [place];  Service: General;  Laterality: Left;  . Tee without cardioversion N/A 06/06/2015    Procedure: TRANSESOPHAGEAL ECHOCARDIOGRAM (TEE);  Surgeon: Skeet Latch, MD;  Location: [place];  Service: Cardiovascular;  Laterality: N/A;  . Incision and drainage of wound Left 06/08/2015    Procedure: IRRIGATION AND DEBRIDEMENT LEFT BREAST  WOUND WITH A CELL PLACEMENT AND VAC;  Surgeon: Loel Lofty Dillingham, DO;  Location: [place];  Service: Plastics;  Laterality: Left;  . Application of a-cell of chest/abdomen Left 06/08/2015    Procedure: WITH A CELL PLACEMENT;  Surgeon: Loel Lofty Dillingham, DO;  Location: [place];  Service: Plastics;  Laterality: Left;  . Application of wound vac Left 06/08/2015    Procedure: AND VAC;  Surgeon: Loel Lofty Dillingham, DO;  Location: [place];  Service: Plastics;  Laterality: Left;  . Skin split graft Left 06/26/2015    Procedure: SKIN GRAFT TO LEFT BREAST WITH ACELL AND VAC;  Surgeon: Loel Lofty Dillingham, DO;  Location: [place];  Service: Plastics;  Laterality: Left;  . Portacath placement Right 07/11/2015    Procedure: INSERTION PORT-A-CATH;  Surgeon: Donnie Mesa, MD;  Location: [employer];  Service: General;  Laterality: Right;   History reviewed. No pertinent family history. Social History  Substance Use Topics  . Smoking status: Former Smoker    Types: Cigarettes    Quit date: 06/04/1993  . Smokeless tobacco: None  . Alcohol Use: No   OB History    No data available     Review of Systems  Constitutional: Negative for fever.  Respiratory: Negative for cough, chest tightness and shortness of breath.   Cardiovascular: Negative for chest pain.  Allergic/Immunologic: Negative for immunocompromised state.  All other systems reviewed and are negative.     Allergies  Review of patient's allergies indicates no  known allergies.  Home Medications   Prior to Admission medications   Medication Sig Start Date End Date Taking? Authorizing Provider  aspirin EC 81 MG tablet Take 81 mg by mouth. 09/18/15 09/17/16 Yes Historical Provider, MD  feeding supplement, ENSURE ENLIVE, (ENSURE ENLIVE) LIQD Take 237 mLs by mouth 2 (two) times daily between meals. Or alternative formulary at facility 06/14/15  Yes Albertine Patricia, MD  simvastatin (ZOCOR) 10 MG tablet Take by mouth. Reported on 07/27/2015 06/14/15  Yes  Historical Provider, MD  cefdinir (OMNICEF) 300 MG capsule TK ONE C PO  BID FOR 10 DAYS 08/26/15   Historical Provider, MD  lidocaine-prilocaine (EMLA) cream Apply to affected area once Patient not taking: Reported on 07/27/2015 07/24/15   Nicholas Lose, MD  LORazepam (ATIVAN) 0.5 MG tablet Take 1 tablet (0.5 mg total) by mouth every 6 (six) hours as needed (Nausea or vomiting). Patient not taking: Reported on 07/27/2015 07/04/15   Nicholas Lose, MD  ondansetron (ZOFRAN) 8 MG tablet Take 1 tablet (8 mg total) by mouth 2 (two) times daily as needed (Nausea or vomiting). Patient not taking: Reported on 07/27/2015 07/04/15   Nicholas Lose, MD  prochlorperazine (COMPAZINE) 10 MG tablet Take 1 tablet (10 mg total) by mouth every 6 (six) hours as needed (Nausea or vomiting). Patient not taking: Reported on 07/27/2015 07/04/15   Nicholas Lose, MD   BP 136/86 mmHg  Pulse 86  Temp(Src) 98.6 F (37 C) (Oral)  Resp 13  Ht 5\' 8"  (1.727 m)  Wt 65.772 kg  BMI 22.05 kg/m2  SpO2 95% Physical Exam  Constitutional: She appears well-developed and well-nourished. No distress.  HENT:  Head: Normocephalic and atraumatic.  Eyes: Conjunctivae are normal. Right eye exhibits no discharge. Left eye exhibits no discharge.  Neck: Normal range of motion. Neck supple. No JVD present.  Cardiovascular: Normal rate, regular rhythm, normal heart sounds and intact distal pulses.   No murmur heard. Pulmonary/Chest: Effort normal and breath sounds normal. No respiratory distress.  Abdominal: Soft. Bowel sounds are normal. She exhibits no distension and no mass. There is no tenderness. There is no rebound and no guarding.  No thoracic, cervical, or lumbar tenderness. Patient declines rectal exam but states normal sensation perianally.   Musculoskeletal: She exhibits no edema.  Neurological: She is alert.  Oriented to hospital and person but not to year  Skin: Skin is warm. No rash noted.  Psychiatric: She has a normal mood and affect.   Nursing note and vitals reviewed.  CRANIAL NERVES: CN 2 (Optic): Visual fields grossly intact CN 3,4,6 (EOM): Pupils equal and reactive to light. Full extraocular eye movement without nystagmus. CN 5 (Trigeminal): Facial sensation is normal, no weakness of masticatory muscles. CN 7 (Facial): No facial weakness or asymmetry. CN 8 (Auditory): Auditory acuity grossly normal. CN 9,10 (Glossophar): The uvula is midline, the palate elevates symmetrically. CN 11 (spinal access): Normal sternocleidomastoid and trapezius strength. CN 12 (Hypoglossal): The tongue is midline. No atrophy or fasciculations.Marland Kitchen   MOTOR: Muscle Strength:  Strength 5/5 and symmetric in the upper and lower extremities No pronation or drift. Muscle Tone: Tone and muscle bulk are normal in the upper and lower extremities.   SENSATION: Intact to light touch in all four extremities, Negative Romberg test.   COORDINATION:   Intact finger-to-nose, no tremor.   GAIT: Deferred due to fall risk   ED Course  Procedures (including critical care time) Labs Review Labs Reviewed  BASIC METABOLIC PANEL - Abnormal; Notable for  the following:    CO2 19 (*)    All other components within normal limits  CBC - Abnormal; Notable for the following:    WBC 11.6 (*)    RBC 3.21 (*)    Hemoglobin 8.7 (*)    HCT 26.9 (*)    Platelets 574 (*)    All other components within normal limits  URINALYSIS, ROUTINE W REFLEX MICROSCOPIC (NOT AT [facility])  OCCULT BLOOD X 1 CARD TO LAB, STOOL    Imaging Review Dg Chest 2 View  10/17/2015  CLINICAL DATA:  Fatigue for 1 week, history of left breast cancer with mastectomy EXAM: CHEST  2 VIEW COMPARISON:  None. FINDINGS: Cardiomediastinal silhouette is unremarkable. No acute infiltrate or pleural effusion. No pulmonary edema. There is right subclavian Port-A-Cath with tip in SVC. The patient is status post left mastectomy and left axillary lymph node dissection. No pneumothorax. IMPRESSION: No active  cardiopulmonary disease. Right Port-A-Cath in place. No pneumothorax. Electronically Signed   By: Lahoma Crocker M.D.   On: 10/17/2015 20:18   Ct Head Wo Contrast  10/17/2015  CLINICAL DATA:  Confusion.  Mental status change. EXAM: CT HEAD WITHOUT CONTRAST TECHNIQUE: Contiguous axial images were obtained from the base of the skull through the vertex without intravenous contrast. COMPARISON:  None. FINDINGS: The ventricles are in the midline without mass effect or shift. No extra-axial fluid collections are identified. There is fairly extensive, age advanced, periventricular white matter disease which is likely microvascular ischemic change. Probable remote cortical infarct involving the left frontal parietal area and also the right occipital area. No findings for acute hemispheric infarction or intracranial hemorrhage. The brainstem and cerebellum are grossly normal. There is a calcified extra-axial mass in the right parietal area which measures 3 cm. This is most likely a benign meningioma. The bony structures are intact. The paranasal sinuses and mastoid air cells are clear. The globes are intact. IMPRESSION: Severe age advanced periventricular white matter disease and probable remote infarcts involving the left frontal parietal area and right occipital area. 3 cm calcified extra-axial mass in the right parietal area, most likely a benign meningioma. Electronically Signed   By: Marijo Sanes M.D.   On: 10/17/2015 20:55   I have personally reviewed and evaluated these images and lab results as part of my medical decision-making.   EKG Interpretation   Date/Time:  Tuesday October 17 2015 18:22:03 EDT Ventricular Rate:  78 PR Interval:    QRS Duration: 97 QT Interval:  410 QTC Calculation: 467 R Axis:   19 Text Interpretation:  Sinus rhythm Baseline wander in lead(s) V2 V6 No  previous tracing Confirmed by Ashok Cordia  MD, Lennette Bihari ([id number]) on 10/17/2015  6:48:18 PM      MDM   Final diagnoses:  Weakness   Weakness    MRI of lumbar and thoracic spine pending. CT head without acute emergent condition, stable meningioma though previously concerning for metastatic disease. Patient has anemia, pending Hemoccult. No rectal bleeding or melena reported however. Fatigue possibly secondary to cancer, anemia, deconditioning. Patient has had multiple falls however and likely requires physical therapy. Pending MRI results and Hemoccult results but patient is hemodynamically stable and will be admitted.    Karma Greaser, MD 10/17/15 2344  Lajean Saver, MD 10/18/15 1300

## 2015-10-17 NOTE — ED Notes (Signed)
This Agricultural consultant received a call from [place].  Pt being sent to r/o cord compression.  [facility] staff reports "fairly sudden bowel and bladder incontinence, leg weakness, and multiple falls." Hx of breast CA.  All treatments have been stopped.

## 2015-10-17 NOTE — Telephone Encounter (Signed)
I spoke with Ms. Robin Cantrell and advised her pt needs to go to ED.  She will call EMS and request transport to [facility] ED.  ED Charge RN notified.

## 2015-10-18 ENCOUNTER — Other Ambulatory Visit: Payer: Self-pay | Admitting: *Deleted

## 2015-10-18 ENCOUNTER — Ambulatory Visit
Admit: 2015-10-18 | Discharge: 2015-10-18 | Disposition: A | Discharge status: Home / Self Care | Payer: Medicare HMO | Attending: Radiation Oncology | Admitting: Radiation Oncology

## 2015-10-18 DIAGNOSIS — I679 Cerebrovascular disease, unspecified: Secondary | ICD-10-CM

## 2015-10-18 DIAGNOSIS — G834 Cauda equina syndrome: Secondary | ICD-10-CM | POA: Diagnosis not present

## 2015-10-18 DIAGNOSIS — D6189 Other specified aplastic anemias and other bone marrow failure syndromes: Secondary | ICD-10-CM

## 2015-10-18 DIAGNOSIS — C50919 Malignant neoplasm of unspecified site of unspecified female breast: Secondary | ICD-10-CM

## 2015-10-18 DIAGNOSIS — C7949 Secondary malignant neoplasm of other parts of nervous system: Secondary | ICD-10-CM

## 2015-10-18 DIAGNOSIS — C50812 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: Secondary | ICD-10-CM

## 2015-10-18 DIAGNOSIS — D649 Anemia, unspecified: Secondary | ICD-10-CM | POA: Diagnosis not present

## 2015-10-18 DIAGNOSIS — C7931 Secondary malignant neoplasm of brain: Secondary | ICD-10-CM

## 2015-10-18 LAB — CBC
HEMATOCRIT: 20.6 % — AB (ref 36.0–46.0)
Hemoglobin: 6.7 g/dL — CL (ref 12.0–15.0)
MCH: 27.8 pg (ref 26.0–34.0)
MCHC: 32.5 g/dL (ref 30.0–36.0)
MCV: 85.5 fL (ref 78.0–100.0)
Platelets: 400 10*3/uL (ref 150–400)
RBC: 2.41 MIL/uL — AB (ref 3.87–5.11)
RDW: 14.8 % (ref 11.5–15.5)
WBC: 8.6 10*3/uL (ref 4.0–10.5)

## 2015-10-18 LAB — CREATININE, SERUM
Creatinine, Ser: 0.59 mg/dL (ref 0.44–1.00)
GFR calc non Af Amer: >60 mL/min (ref >60)

## 2015-10-18 LAB — HEMOGLOBIN AND HEMATOCRIT, BLOOD
HEMATOCRIT: 27.8 % — AB (ref 36.0–46.0)
HEMOGLOBIN: 9 g/dL — AB (ref 12.0–15.0)

## 2015-10-18 LAB — LACTATE DEHYDROGENASE: LDH: 231 U/L — ABNORMAL HIGH (ref 98–192)

## 2015-10-18 MED ORDER — ACETAMINOPHEN 325 MG PO TABS: 650.0000 mg | ORAL_TABLET | Freq: Four times a day (QID) | ORAL | Status: DC | PRN

## 2015-10-18 MED ORDER — ALTEPLASE 2 MG IJ SOLR
2.0000 mg | Freq: Once | INTRAMUSCULAR | Status: AC
  Administered 2015-10-18: 2 mg
  Filled 2015-10-18: qty 2

## 2015-10-18 MED ORDER — HYDROCODONE-ACETAMINOPHEN 5-325 MG PO TABS: 1.0000 | ORAL_TABLET | ORAL | Status: DC | PRN

## 2015-10-18 MED ORDER — ONDANSETRON HCL 4 MG/2ML IJ SOLN: 4.0000 mg | Freq: Four times a day (QID) | INTRAMUSCULAR | Status: DC | PRN

## 2015-10-18 MED ORDER — SODIUM CHLORIDE 0.9% FLUSH
10.0000 mL | INTRAVENOUS | Status: DC | PRN
  Administered 2015-10-19 – 2015-10-20 (×2): 10 mL
  Filled 2015-10-18 (×2): qty 40

## 2015-10-18 MED ORDER — ENSURE ENLIVE PO LIQD
237.0000 mL | Freq: Two times a day (BID) | ORAL | Status: DC
  Administered 2015-10-18 – 2015-10-20 (×6): 237 mL via ORAL

## 2015-10-18 MED ORDER — SIMVASTATIN 5 MG PO TABS
10.0000 mg | ORAL_TABLET | Freq: Every day | ORAL | Status: DC
  Administered 2015-10-18 – 2015-10-19 (×2): 10 mg via ORAL
  Filled 2015-10-18 (×2): qty 2

## 2015-10-18 MED ORDER — ASPIRIN EC 81 MG PO TBEC
81.0000 mg | DELAYED_RELEASE_TABLET | Freq: Every day | ORAL | Status: DC
  Administered 2015-10-18 – 2015-10-20 (×3): 81 mg via ORAL
  Filled 2015-10-18 (×3): qty 1

## 2015-10-18 MED ORDER — ACETAMINOPHEN 650 MG RE SUPP
650.0000 mg | Freq: Four times a day (QID) | RECTAL | Status: DC | PRN
Start: 2015-10-18 — End: 2015-10-20

## 2015-10-18 MED ORDER — DEXAMETHASONE 4 MG PO TABS
4.0000 mg | ORAL_TABLET | Freq: Two times a day (BID) | ORAL | Status: DC
  Administered 2015-10-18 – 2015-10-19 (×2): 4 mg via ORAL
  Filled 2015-10-18 (×2): qty 1

## 2015-10-18 MED ORDER — ONDANSETRON HCL 4 MG PO TABS: 4.0000 mg | ORAL_TABLET | Freq: Four times a day (QID) | ORAL | Status: DC | PRN

## 2015-10-18 MED ORDER — ENOXAPARIN SODIUM 40 MG/0.4ML ~~LOC~~ SOLN: 40.0000 mg | SUBCUTANEOUS | Status: DC

## 2015-10-18 MED ORDER — SENNOSIDES-DOCUSATE SODIUM 8.6-50 MG PO TABS: 1.0000 | ORAL_TABLET | Freq: Every evening | ORAL | Status: DC | PRN

## 2015-10-18 NOTE — Care Management Obs Status (Signed)
[facility] NOTIFICATION   Patient Details  Name: Robin Cantrell MRN: [id number] Date of Birth: 02/13/1946   Medicare Observation Status Notification Given:  Yes    CrutchfieldAntony Haste, RN 10/18/2015, 1:23 PM

## 2015-10-18 NOTE — ED Notes (Signed)
Pt returned from MRI; Dr.Danford aware pt has returned

## 2015-10-18 NOTE — Progress Notes (Signed)
CRITICAL VALUE ALERT  Critical value received:  HgB 6.7  Date of notification:  10/18/15  Time of notification:  0611  Critical value read back:Yes.    Nurse who received alert:  Linford Arnold, RN  MD notified (1st page):  C. Loleta Books, MD  Time of first page:  Text page at [place]  MD notified (2nd page):  Time of second page:  Responding MD:  C. Danford, MD  Time MD responded:  [phone]  New orders given to redraw HgB and Hct, as well as type and screen pt

## 2015-10-18 NOTE — Progress Notes (Signed)
PROGRESS NOTE  Robin Cantrell [id number] DOB: 1946-03-17 DOA: 10/17/2015 PCP: Pcp Not In System  Brief History:  70 y.o. female with a past medical history significant for breast cancer T4b N0i+M1 ER/PR neg, HTN and hx of stroke who presents with falls and incontinence.  Pt is a poor historian.  [facility] chemo 07/18/15 to 08/14/15. The patient presents with two-week history of increasing lower extremity weakness with inability to get up out of bed. In addition, the patient has had new bowel or bladder incontinence. The patient is two-day history of gait instability and falls. Dr. Charlesetta Garibaldi office was contacted who was concerned aboutconcerned about cauda equina or progression of brain metastasis and recommended hospital evaluation.  MRI of lumbar spine revealed intradural extramedullary nodules concerning for drop metastasis adherent to the cauda equina.  Assessment/Plan: Cauda equina syndrome:  -Dr. Loleta Books reviewed Imaging reviewed with Radiology. 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 dependently seeded metastasis from elsewhere in the neuraxis, given her diffuse cerebral metastatic disease, and could be consistent with a cauda equina syndrome.  -Patient is not a surgical candidate and no other emergent treatments are appropriate. -consulted Dr. Lindi Adie -consulted palliative medicine for [place] -niece, Francisco Capuchin [phone]), but niece was clear that patient is DNR and likely would not want invasive or overly aggressive measures. Hospice has not been involved, that I can tell.  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 No history of bleeding or melena.      Disposition Plan:  Not stable for d/c Family Communication:   No Family at bedside   Consultants:  Medical oncology--Dr. Lindi Adie, palliative medicine  Code Status:  DNR  DVT Prophylaxis:  SCDs   Procedures: As Listed in Progress Note Above  Antibiotics: None    Subjective: Patient denies fevers, chills, headache, chest pain, dyspnea, nausea, vomiting, diarrhea, abdominal pain, dysuria, hematuria   Objective: Filed Vitals:   10/18/15 0641 10/18/15 0958 10/18/15 1002 10/18/15 1401  BP: 125/70 116/70 152/59 106/69  Pulse: 84 83 84 85  Temp: 99.5 F (37.5 C) 98.9 F (37.2 C) 97.7 F (36.5 C) 98.3 F (36.8 C)  TempSrc: Oral Oral Oral Oral  Resp: 20  19 18   Height:      Weight:      SpO2: 98% 98% 97% 98%    Intake/Output Summary (Last 24 hours) at 10/18/15 1548 Last data filed at 10/18/15 0900  Gross per 24 hour  Intake      0 ml  Output      0 ml  Net      0 ml   Weight change:  Exam:   General:  Pt is alert, follows commands appropriately, not in acute distress  HEENT: No icterus, No thrush, No neck mass, [place]/AT  Cardiovascular: RRR, S1/S2, no rubs, no gallops  Respiratory: Diminished breath sounds but CTA  Abdomen: Soft/+BS, non tender, non distended, no guarding  Extremities: No edema, No lymphangitis, No petechiae, No rashes, no synovitis   Data Reviewed: I have personally reviewed following labs and imaging studies Basic Metabolic Panel:  Recent Labs Lab 10/17/15 2144 10/18/15 0520  NA 136  --  K 4.2  --   CL 107  --   CO2 19*  --   GLUCOSE 97  --   BUN 16  --   CREATININE 0.91 0.59  CALCIUM 9.4  --    Liver Function Tests: No results for input(s): AST, ALT, ALKPHOS, BILITOT, PROT, ALBUMIN in the last 168 hours. No results for input(s): LIPASE, AMYLASE in the last 168 hours. No results for input(s): AMMONIA in the last 168 hours. Coagulation Profile: No results for input(s):  INR, PROTIME in the last 168 hours. CBC:  Recent Labs Lab 10/17/15 2144 10/18/15 0520 10/18/15 0848  WBC 11.6* 8.6  --   HGB 8.7* 6.7* 9.0*  HCT 26.9* 20.6* 27.8*  MCV 83.8 85.5  --   PLT 574* 400  --    Cardiac Enzymes: No results for input(s): CKTOTAL, CKMB, CKMBINDEX, TROPONINI in the last 168 hours. BNP: Invalid input(s): POCBNP CBG: No results for input(s): GLUCAP in the last 168 hours. HbA1C: No results for input(s): HGBA1C in the last 72 hours. Urine analysis:    Component Value Date/Time   COLORURINE YELLOW 10/17/2015 1845   APPEARANCEUR CLEAR 10/17/2015 1845   LABSPEC 1.023 10/17/2015 1845   PHURINE 5.5 10/17/2015 1845   GLUCOSEU NEGATIVE 10/17/2015 1845   HGBUR NEGATIVE 10/17/2015 1845   BILIRUBINUR NEGATIVE 10/17/2015 1845   KETONESUR NEGATIVE 10/17/2015 1845   PROTEINUR NEGATIVE 10/17/2015 1845   NITRITE NEGATIVE 10/17/2015 1845   LEUKOCYTESUR NEGATIVE 10/17/2015 1845   Sepsis Labs: @LABRCNTIP (procalcitonin:4,lacticidven:4) )No results found for this or any previous visit (from the past 240 hour(s)).   Scheduled Meds: . aspirin EC  81 mg Oral Daily  . feeding supplement (ENSURE ENLIVE)  237 mL Oral BID BM  . simvastatin  10 mg Oral q1800   Continuous Infusions:   Procedures/Studies: Dg Chest 2 View  10/17/2015  CLINICAL DATA:  Fatigue for 1 week, history of left breast cancer with mastectomy EXAM: CHEST  2 VIEW COMPARISON:  None. FINDINGS: Cardiomediastinal silhouette is unremarkable. No acute infiltrate or pleural effusion. No pulmonary edema. There is right subclavian Port-A-Cath with tip in SVC. The patient is status post left mastectomy and left axillary lymph node dissection. No pneumothorax. IMPRESSION: No active cardiopulmonary disease. Right Port-A-Cath in place. No pneumothorax. Electronically Signed   By: Lahoma Crocker M.D.   On: 10/17/2015 20:18   Ct Head Wo Contrast  10/17/2015  CLINICAL DATA:  Confusion.  Mental status change. EXAM: CT HEAD  WITHOUT CONTRAST TECHNIQUE: Contiguous axial images were obtained from the base of the skull through the vertex without intravenous contrast. COMPARISON:  None. FINDINGS: The ventricles are in the midline without mass effect or shift. No extra-axial fluid collections are identified. There is fairly extensive, age advanced, periventricular white matter disease which is likely microvascular ischemic change. Probable remote cortical infarct involving the left frontal parietal area and also the right occipital area. No findings for acute hemispheric infarction or intracranial hemorrhage. The brainstem and cerebellum are grossly normal. There is a calcified extra-axial mass in the right parietal area which measures 3 cm. This is most likely a benign meningioma. The bony structures are intact. The paranasal sinuses and mastoid air cells are clear. The globes are intact. IMPRESSION: Severe age advanced periventricular white matter disease and probable remote infarcts involving the left frontal parietal area and right occipital area. 3 cm calcified extra-axial mass in the right parietal area, most likely a benign meningioma. Electronically Signed   By: Ricky Stabs.D.  On: 10/17/2015 20:55   Mr Thoracic Spine Wo Contrast  10/18/2015  CLINICAL DATA:  Weakness for 2 days. History of breast cancer and suspected brain metastasis. Worsening lower extremity weakness, concern for spinal compression. Bowel and bladder incontinence, recurrent falls. EXAM: MRI THORACIC AND LUMBAR SPINE WITHOUT CONTRAST TECHNIQUE: Multiplanar and multiecho pulse sequences of the thoracic and lumbar spine were obtained without intravenous contrast. Interpreting radiologist requested gadolinium administration, however patient could not remain still, and discontinued the examination. COMPARISON:  None available for comparison at time of study interpretation. FINDINGS: MRI THORACIC SPINE FINDINGS ALIGNMENT: Maintenance of the thoracic kyphosis. No  malalignment. VERTEBRAE/DISCS: Vertebral bodies are intact. Intervertebral disc morphology's and signal are normal. CORD: Thoracic spinal cord is normal morphology and signal characteristics to the level the conus medullaris which terminates at T12-L1. PREVERTEBRAL AND PARASPINAL SOFT TISSUES: Bilateral dependent atelectasis. Partially imaged LEFT supraclavicular lymphadenopathy measuring up to 14 mm short axis. DEGENERATIVE CHANGE: No degenerative change for age. No neurocompression. MRI LUMBAR SPINE FINDINGS OSSEOUS STRUCTURES: Lumbar vertebral bodies intact. Maintenance of lumbar lordosis. Grade 1 L4-5 anterolisthesis without spondylolysis. Mild L4-5 disc height loss. Decreased T2 signal within all lumbar discs compatible with mild desiccation. No suspicious bone marrow signal. SPINAL CORD: Conus medullaris terminates at L1-2 and demonstrates normal morphology and signal characteristics. At least 4 intermediate T1 and T2 intradural, extramedullary nodules at the conus medullaris and cauda equina from L1-2 at L3, largest measuring 14 mm. SOFT TISSUES: Included prevertebral and paraspinal soft tissues are nonacute. 13 mm RIGHT adrenal nodule. LEVEL BY LEVEL EVALUATION: (severely motion degraded axial T1 limits evaluation) L1 to in L2-3: No disc bulge, canal stenosis nor neural foraminal narrowing. L3-4: Annular bulging. Moderate facet arthropathy and ligamentum flavum redundancy without canal stenosis or neural foraminal narrowing. L4-5: Anterolisthesis. Small broad-based disc bulge. Severe facet arthropathy and ligamentum flavum redundancy with trace facet effusions which are likely reactive. Moderate to severe canal stenosis. Mild RIGHT, moderate LEFT neural foraminal narrowing. L5-S1: Annular bulging. Severe facet arthropathy and ligamentum flavum redundancy without canal stenosis. Mild RIGHT, moderate LEFT neural foraminal narrowing. IMPRESSION: MR THORACIC SPINE IMPRESSION Normal MRI of the thoracic spine.  Partially imaged LEFT supraclavicular lymphadenopathy highly concerning for metastatic disease in patient with history of breast cancer. MR LUMBAR SPINE IMPRESSION At least 4 intradural, extramedullary nodules highly concerning for drop metastasis, incompletely assessed on this motion degraded noncontrast examination. Grade 1 L4-5 anterolisthesis without spondylolysis. Moderate to severe canal stenosis at L4-5. Moderate LEFT L4-5 and L5-S1 neural foraminal narrowing. 13 mm RIGHT adrenal nodule. Recommend comparison with prior imaging to assess stability (none available at present time) ; in patient with history of cancer, metastasis not excluded. Electronically Signed   By: Elon Alas M.D.   On: 10/18/2015 00:57   Mr Lumbar Spine Wo Contrast  10/18/2015  CLINICAL DATA:  Weakness for 2 days. History of breast cancer and suspected brain metastasis. Worsening lower extremity weakness, concern for spinal compression. Bowel and bladder incontinence, recurrent falls. EXAM: MRI THORACIC AND LUMBAR SPINE WITHOUT CONTRAST TECHNIQUE: Multiplanar and multiecho pulse sequences of the thoracic and lumbar spine were obtained without intravenous contrast. Interpreting radiologist requested gadolinium administration, however patient could not remain still, and discontinued the examination. COMPARISON:  None available for comparison at time of study interpretation. FINDINGS: MRI THORACIC SPINE FINDINGS ALIGNMENT: Maintenance of the thoracic kyphosis. No malalignment. VERTEBRAE/DISCS: Vertebral bodies are intact. Intervertebral disc morphology's and signal are normal. CORD: Thoracic spinal cord is normal morphology and signal characteristics to the level  the conus medullaris which terminates at T12-L1. PREVERTEBRAL AND PARASPINAL SOFT TISSUES: Bilateral dependent atelectasis. Partially imaged LEFT supraclavicular lymphadenopathy measuring up to 14 mm short axis. DEGENERATIVE CHANGE: No degenerative change for age. No  neurocompression. MRI LUMBAR SPINE FINDINGS OSSEOUS STRUCTURES: Lumbar vertebral bodies intact. Maintenance of lumbar lordosis. Grade 1 L4-5 anterolisthesis without spondylolysis. Mild L4-5 disc height loss. Decreased T2 signal within all lumbar discs compatible with mild desiccation. No suspicious bone marrow signal. SPINAL CORD: Conus medullaris terminates at L1-2 and demonstrates normal morphology and signal characteristics. At least 4 intermediate T1 and T2 intradural, extramedullary nodules at the conus medullaris and cauda equina from L1-2 at L3, largest measuring 14 mm. SOFT TISSUES: Included prevertebral and paraspinal soft tissues are nonacute. 13 mm RIGHT adrenal nodule. LEVEL BY LEVEL EVALUATION: (severely motion degraded axial T1 limits evaluation) L1 to in L2-3: No disc bulge, canal stenosis nor neural foraminal narrowing. L3-4: Annular bulging. Moderate facet arthropathy and ligamentum flavum redundancy without canal stenosis or neural foraminal narrowing. L4-5: Anterolisthesis. Small broad-based disc bulge. Severe facet arthropathy and ligamentum flavum redundancy with trace facet effusions which are likely reactive. Moderate to severe canal stenosis. Mild RIGHT, moderate LEFT neural foraminal narrowing. L5-S1: Annular bulging. Severe facet arthropathy and ligamentum flavum redundancy without canal stenosis. Mild RIGHT, moderate LEFT neural foraminal narrowing. IMPRESSION: MR THORACIC SPINE IMPRESSION Normal MRI of the thoracic spine. Partially imaged LEFT supraclavicular lymphadenopathy highly concerning for metastatic disease in patient with history of breast cancer. MR LUMBAR SPINE IMPRESSION At least 4 intradural, extramedullary nodules highly concerning for drop metastasis, incompletely assessed on this motion degraded noncontrast examination. Grade 1 L4-5 anterolisthesis without spondylolysis. Moderate to severe canal stenosis at L4-5. Moderate LEFT L4-5 and L5-S1 neural foraminal narrowing. 13  mm RIGHT adrenal nodule. Recommend comparison with prior imaging to assess stability (none available at present time) ; in patient with history of cancer, metastasis not excluded. Electronically Signed   By: Elon Alas M.D.   On: 10/18/2015 00:57    Macy Polio, DO  [facility] Pager [phone]  If 7PM-7AM, please contact night-coverage [email or web address] Password [email or web address] 10/18/2015, 3:48 PM

## 2015-10-18 NOTE — Progress Notes (Signed)
Pt transferred to unit from ED via NT x 3. Pt alert and oriented to self upon arrival. No complaints of pain or discomfort. No signs or symptoms of acute distress. Pt oriented to unit as well as unit procedures. Pt now resting in bed at lowest position, bed alarm on, call light in reach. Will continue to monitor. Fortino Sic, RN, BSN 10/18/2015 3:34 AM

## 2015-10-18 NOTE — Progress Notes (Signed)
[facility] fax received x 2 regarding pt being transferred via EMS to ED for evaluation.  Dr. Lindi Adie updated on pt's admission.  Document sent to scan.

## 2015-10-18 NOTE — Evaluation (Addendum)
Physical Therapy Evaluation Patient Details Name: Robin Cantrell MRN: [id number] DOB: 07-30-1945 Today's Date: 10/18/2015   History of Present Illness  Patient is a 70 y/o female with hx of breast ca with mets to the liver, bones, HTN and CVA presents with BLE weakness, incontinence, and falls. Concern for cauda equina syndrome. 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 Pt is a poor historian and not able to provide PLOF/history. Requires Mod A to stand from EOB and for SPT to/from [facility]. Marked functional weakness in BLEs. High Fall risk. Will need to determine if caregiver can provide the necessary assist at home and what equipment is needed for discharge. Recommend hospice consult. Will follow.    Follow Up Recommendations Home health PT;Supervision/Assistance - 24 hour (pending ability for family to provide necessary assist,)    Equipment Recommendations  Other (comment) (TBA)    Recommendations for Other Services OT consult     Precautions / Restrictions Precautions Precautions: Fall Restrictions Weight Bearing Restrictions: No      Mobility  Bed Mobility Overal bed mobility: Needs Assistance Bed Mobility: Supine to Sit;Sit to Supine     Supine to sit: Min guard;HOB elevated Sit to supine: Min guard   General bed mobility comments: Max cues to get to EOB, use of rail. Increased time. Performed x3.  Transfers Overall transfer level: Needs assistance Equipment used: 1 person hand held assist Transfers: Sit to/from [facility] Sit to Stand: Mod assist Stand pivot  transfers: Mod assist       General transfer comment: Assist to stand from EOB with cues for anterior weight shift; pt stood without assist from [facility] with increased knee flexion. SPT bed to/from [facility] Mod A for balance. Able to take a few steps to get to/from [facility] with increased knee/hip flexion.  Ambulation/Gait                Stairs            Wheelchair Mobility    Modified Rankin (Stroke Patients Only)       Balance Overall balance assessment: Needs assistance;History of Falls Sitting-balance support: Feet supported;No upper extremity supported Sitting balance-Leahy Scale: Good     Standing balance support: During functional activity Standing balance-Leahy Scale: Poor Standing balance comment: Reliant on external support for balance. Not able to stand fully upright due to weakness.                             Pertinent Vitals/Pain Pain Assessment: No/denies pain    Home Living Family/patient expects to be discharged to:: Private residence Living Arrangements: Other relatives (niece) Available Help at Discharge: Family   Home Access: Stairs to enter   Technical brewer of Steps: doesnt know how many   Home Equipment: Kasandra Knudsen - single point      Prior Function           Comments: Pt not able to provide PLOF/history and no family members present at this time. Per MD notes, PT and OT were coming to the house until about 2 weeks ago, and the patient seemed relatively functional and ambulatory. But since then, pt has  been weaker and not able to get up.     Hand Dominance   Dominant Hand: Right    Extremity/Trunk Assessment               Lower Extremity Assessment: Generalized weakness;RLE deficits/detail;LLE deficits/detail;Difficult to assess due to impaired cognition RLE Deficits / Details: Grossly ~3+/5 throughout with MMT but weaker functionally. LLE Deficits / Details: Grossly ~3+/5 throughout with MMT but weaker  functionally.     Communication   Communication: No difficulties  Cognition Arousal/Alertness: Awake/alert Behavior During Therapy: WFL for tasks assessed/performed Overall Cognitive Status: Impaired/Different from baseline Area of Impairment: Orientation;Safety/judgement;Problem solving Orientation Level: Disoriented to;Time;Situation       Safety/Judgement: Decreased awareness of safety;Decreased awareness of deficits   Problem Solving: Difficulty sequencing;Slow processing;Requires verbal cues;Requires tactile cues General Comments: Pt continually laying down and sitting up and laying down, "sometimes I just need to lay down."    General Comments      Exercises        Assessment/Plan    PT Assessment Patient needs continued PT services  PT Diagnosis Difficulty walking;Generalized weakness;Altered mental status   PT Problem List Decreased strength;Decreased activity tolerance;Decreased balance;Decreased mobility;Decreased safety awareness;Decreased knowledge of precautions;Decreased knowledge of use of DME;Decreased cognition  PT Treatment Interventions Balance training;Gait training;Stair training;Functional mobility training;Therapeutic activities;Therapeutic exercise;Patient/family education;DME instruction   PT Goals (Current goals can be found in the Care Plan section) Acute Rehab PT Goals Patient Stated Goal: none stated PT Goal Formulation: With patient Time For Goal Achievement: 11/01/15 Potential to Achieve Goals: Fair    Frequency Min 3X/week   Barriers to discharge   not sure of level of support at home.    Co-evaluation               End of Session Equipment Utilized During Treatment: Gait belt Activity Tolerance: Patient tolerated treatment well Patient left: in bed;with call bell/phone within reach;with bed alarm set Nurse Communication: Mobility status    Functional Assessment Tool Used: clinical judgment Functional Limitation: Mobility:  Walking and moving around Mobility: Walking and Moving Around Current Status [id number]): At least 40 percent but less than 60 percent impaired, limited or restricted Mobility: Walking and Moving Around Goal Status [phone]): At least 20 percent but less than 40 percent impaired, limited or restricted    Time: 1120-1145 PT Time Calculation (min) (ACUTE ONLY): 25 min   Charges:   PT Evaluation $PT Eval Moderate Complexity: 1 Procedure PT Treatments $Therapeutic Activity: 8-22 mins   PT G Codes:   PT G-Codes **NOT FOR INPATIENT CLASS** Functional Assessment Tool Used: clinical judgment Functional Limitation: Mobility: Walking and moving around Mobility: Walking and Moving Around Current Status [id number]): At least 40 percent but less than 60 percent impaired, limited or restricted Mobility: Walking and Moving Around Goal Status [phone]): At least 20 percent but less than 40 percent impaired, limited or restricted    Oanh Devivo A Grizel Vesely 10/18/2015, 11:57 AM Wray Kearns, PT, DPT [phone]

## 2015-10-18 NOTE — Progress Notes (Signed)
Seen and examined the patient S: Adm with instability of gait and incontinence of urine and stool O: moves all extremities Lungs: clear Heart: S 1 S2 nl Abd: soft Ext: no edema  Reviewed MRI showing drop mets Plan:  1. Met breast cancer: with declining PS. Patient cannot tolerate any chemotherapy. I recommended hospice to help her with end of life issues Called the phone no on file for the patients niece. No response Agree with Dr.Tat. 2. Severe anemia: Transfusions  Please consult hospice so that they may discuss options. Thanks

## 2015-10-18 NOTE — ED Notes (Signed)
Port accessed, no blood return noted, unable to flush port. Second nurse verified difficulty. IV team contacted.  43M contacted for delay in transport.

## 2015-10-18 NOTE — ED Notes (Signed)
Ms. Elby Beck, pt's niece, updated on plan of care. Pt gave this RN verbal permission to speak to Ms. Dunn concerning care

## 2015-10-18 NOTE — H&P (Addendum)
History and Physical  Patient Name: Robin Cantrell     [id number]    DOB: 02/28/1946    DOA: 10/17/2015 PCP: Pcp Not In System   Patient coming from: Home  Chief Complaint: Leg weakness  HPI: Robin Cantrell is a 70 y.o. female with a past medical history significant for breast cancer T4b N0i+M1 ER/PR neg, HTN and hx of stroke who presents with falls and incontinence.  Most history is collected from the patient's niece/caregiver, [name], as the patient is judged an incomplete historian.  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  She underwent whole brain radiation in Feb/March and then palliative chemo with eribulin/[place] until early May when this was stopped because of toxicities (diarrhea and hospitalization for UTI at [facility]).  Plan was to follow up with Dr. Lindi Adie in early July to discuss restarting chemo possibly.  PT and OT were coming to the house until about 2 weeks ago, and the patient seemed relatively functional and ambulatory.  However, since the last two weeks, the patient has been weaker and weaker, and now unable at times to get up.  She is also newly incontinent of bowel and bladder, seems unaware that she has soiled herself.  Two days ago, she fell twice and yesterday she was weaker, so niece [name] called Dr. Geralyn Flash office today who was concerned about cauda equina or progression of brain metastasis and recommended hospital evaluation.  For her part, the patient endorses only progressive gait instability and weakness.  ED course: -afebrile, hemodynamically stable, saturating well on ambient air -Na 136, K 4.2, Cr 0.9 (baseline), WBC 11.6K, Hgb 8.7  (previous baseline 10) -CT of the head was nonspecific, urinalysis clear, chest x-ray without focal opacity -MRI of the thoracic and lumbar spine were ordered, and TRH were asked to evaluate for in-hospital observation     Review of  Systems:  Unreliable from patient, but niece denies any fever, chills, cough, sputum, dysuria, urinary odor, urinary frequency, syncope, seizure, focal weakness.  All other systems negative except as just noted or noted in the history of present illness.    Past Medical History  Diagnosis Date  . Stroke ([facility]) 06/10/15    Acute Ischemic attack  . Cancer [facility])     mestatic breast    Past Surgical History  Procedure Laterality Date  . Ankle fracture surgery    . Total mastectomy Left 06/02/2015    Procedure: LEFT TOTAL MASTECTOMY;  Surgeon: Donnie Mesa, MD;  Location: [place];  Service: General;  Laterality: Left;  . Tee without cardioversion N/A 06/06/2015    Procedure: TRANSESOPHAGEAL ECHOCARDIOGRAM (TEE);  Surgeon: Skeet Latch, MD;  Location: [place];  Service: Cardiovascular;  Laterality: N/A;  . Incision and drainage of wound Left 06/08/2015    Procedure: IRRIGATION AND DEBRIDEMENT LEFT BREAST WOUND WITH A CELL PLACEMENT AND VAC;  Surgeon: Loel Lofty Dillingham, DO;  Location: [place];  Service: Plastics;  Laterality: Left;  . Application of a-cell of chest/abdomen Left 06/08/2015    Procedure: WITH A CELL PLACEMENT;  Surgeon: Loel Lofty Dillingham, DO;  Location: [place];  Service: Plastics;  Laterality: Left;  . Application of wound vac Left 06/08/2015    Procedure: AND VAC;  Surgeon: Loel Lofty Dillingham, DO;  Location: [place];  Service: Plastics;  Laterality: Left;  . Skin split graft Left 06/26/2015    Procedure: SKIN GRAFT TO LEFT BREAST WITH ACELL AND  VAC;  Surgeon: Loel Lofty Dillingham, DO;  Location: [place];  Service: Plastics;  Laterality: Left;  . Portacath placement Right 07/11/2015    Procedure: INSERTION PORT-A-CATH;  Surgeon: Donnie Mesa, MD;  Location: [place];  Service: General;  Laterality: Right;    Social History: Patient lives with her niece.  Previously was homeless.  The patient walks unassisted before the last two weeks.  Former smoker.  Memory loss is recent.  No Known  Allergies  Family history: Unable to obtain due to patient mentation  Prior to Admission medications   Medication Sig Start Date End Date Taking? Authorizing Provider  aspirin EC 81 MG tablet Take 81 mg by mouth. 09/18/15 09/17/16 Yes Historical Provider, MD  feeding supplement, ENSURE ENLIVE, (ENSURE ENLIVE) LIQD Take 237 mLs by mouth 2 (two) times daily between meals. Or alternative formulary at facility 06/14/15  Yes Albertine Patricia, MD  simvastatin (ZOCOR) 10 MG tablet Take by mouth. Reported on 07/27/2015 06/14/15  Yes Historical Provider, MD       Physical Exam: BP 127/79 mmHg  Pulse 78  Temp(Src) 98.6 F (37 C) (Oral)  Resp 24  Ht 5\' 8"  (1.727 m)  Wt 65.772 kg (145 lb)  BMI 22.05 kg/m2  SpO2 100% General appearance: Thin adult female, alert and in no acute distress, appears tired.  A&O x2.   Eyes: Anicteric, conjunctiva pink, lids and lashes normal.     ENT: No nasal deformity, discharge, or epistaxis.  OP moist without lesions.  Partially edentulous. Lymph: No cervical or supraclavicular lymphadenopathy. Skin: Warm and dry.  No jaundice.  No suspicious rashes or lesions. Cardiac: RRR, nl S1-S2, no murmurs appreciated.  No LE edema.  Radial and DP pulses 2+ and symmetric. Respiratory: Normal respiratory rate and rhythm.  Atelectatic crackles fine at both bases, no wheezes. Abdomen: Abdomen soft without rigidity.  No TTP. No ascites, distension.   MSK: No deformities or effusions. Neuro: GLobally weak.  Unable to pull up from lying down.  Cranial nerves normal.  Sensorium intact and responding to questions, attention seems normal, oriented only to self and place.  Speech is fluent.  Moves all extremities but slowly and with 4/5 strength, equal.   Psych: Behavior appropriate.  Affect blunted.  No evidence of aural or visual hallucinations or delusions.       Labs on Admission:  I have personally reviewed following labs and imaging studies: CBC:  Recent Labs Lab  10/17/15 2144  WBC 11.6*  HGB 8.7*  HCT 26.9*  MCV 83.8  PLT 123456*   Basic Metabolic Panel:  Recent Labs Lab 10/17/15 2144  NA 136  K 4.2  CL 107  CO2 19*  GLUCOSE 97  BUN 16  CREATININE 0.91  CALCIUM 9.4   GFR: Estimated Creatinine Clearance: 60.5 mL/min (by C-G formula based on Cr of 0.91).       Radiological Exams on Admission: Personally reviewed: Dg Chest 2 View  10/17/2015  CLINICAL DATA:  Fatigue for 1 week, history of left breast cancer with mastectomy EXAM: CHEST  2 VIEW COMPARISON:  None. FINDINGS: Cardiomediastinal silhouette is unremarkable. No acute infiltrate or pleural effusion. No pulmonary edema. There is right subclavian Port-A-Cath with tip in SVC. The patient is status post left mastectomy and left axillary lymph node dissection. No pneumothorax. IMPRESSION: No active cardiopulmonary disease. Right Port-A-Cath in place. No pneumothorax. Electronically Signed   By: Lahoma Crocker M.D.   On: 10/17/2015 20:18   Ct Head Wo Contrast  10/17/2015  CLINICAL DATA:  Confusion.  Mental status change. EXAM: CT HEAD WITHOUT CONTRAST TECHNIQUE: Contiguous axial images were obtained from the base of the skull through the vertex without intravenous contrast. COMPARISON:  None. FINDINGS: The ventricles are in the midline without mass effect or shift. No extra-axial fluid collections are identified. There is fairly extensive, age advanced, periventricular white matter disease which is likely microvascular ischemic change. Probable remote cortical infarct involving the left frontal parietal area and also the right occipital area. No findings for acute hemispheric infarction or intracranial hemorrhage. The brainstem and cerebellum are grossly normal. There is a calcified extra-axial mass in the right parietal area which measures 3 cm. This is most likely a benign meningioma. The bony structures are intact. The paranasal sinuses and mastoid air cells are clear. The globes are intact.  IMPRESSION: Severe age advanced periventricular white matter disease and probable remote infarcts involving the left frontal parietal area and right occipital area. 3 cm calcified extra-axial mass in the right parietal area, most likely a benign meningioma. Electronically Signed   By: Marijo Sanes M.D.   On: 10/17/2015 20:55   Mr Thoracic Spine Wo Contrast  10/18/2015  CLINICAL DATA:  Weakness for 2 days. History of breast cancer and suspected brain metastasis. Worsening lower extremity weakness, concern for spinal compression. Bowel and bladder incontinence, recurrent falls. EXAM: MRI THORACIC AND LUMBAR SPINE WITHOUT CONTRAST TECHNIQUE: Multiplanar and multiecho pulse sequences of the thoracic and lumbar spine were obtained without intravenous contrast. Interpreting radiologist requested gadolinium administration, however patient could not remain still, and discontinued the examination. COMPARISON:  None available for comparison at time of study interpretation. FINDINGS: MRI THORACIC SPINE FINDINGS ALIGNMENT: Maintenance of the thoracic kyphosis. No malalignment. VERTEBRAE/DISCS: Vertebral bodies are intact. Intervertebral disc morphology's and signal are normal. CORD: Thoracic spinal cord is normal morphology and signal characteristics to the level the conus medullaris which terminates at T12-L1. PREVERTEBRAL AND PARASPINAL SOFT TISSUES: Bilateral dependent atelectasis. Partially imaged LEFT supraclavicular lymphadenopathy measuring up to 14 mm short axis. DEGENERATIVE CHANGE: No degenerative change for age. No neurocompression. MRI LUMBAR SPINE FINDINGS OSSEOUS STRUCTURES: Lumbar vertebral bodies intact. Maintenance of lumbar lordosis. Grade 1 L4-5 anterolisthesis without spondylolysis. Mild L4-5 disc height loss. Decreased T2 signal within all lumbar discs compatible with mild desiccation. No suspicious bone marrow signal. SPINAL CORD: Conus medullaris terminates at L1-2 and demonstrates normal morphology  and signal characteristics. At least 4 intermediate T1 and T2 intradural, extramedullary nodules at the conus medullaris and cauda equina from L1-2 at L3, largest measuring 14 mm. SOFT TISSUES: Included prevertebral and paraspinal soft tissues are nonacute. 13 mm RIGHT adrenal nodule. LEVEL BY LEVEL EVALUATION: (severely motion degraded axial T1 limits evaluation) L1 to in L2-3: No disc bulge, canal stenosis nor neural foraminal narrowing. L3-4: Annular bulging. Moderate facet arthropathy and ligamentum flavum redundancy without canal stenosis or neural foraminal narrowing. L4-5: Anterolisthesis. Small broad-based disc bulge. Severe facet arthropathy and ligamentum flavum redundancy with trace facet effusions which are likely reactive. Moderate to severe canal stenosis. Mild RIGHT, moderate LEFT neural foraminal narrowing. L5-S1: Annular bulging. Severe facet arthropathy and ligamentum flavum redundancy without canal stenosis. Mild RIGHT, moderate LEFT neural foraminal narrowing. IMPRESSION: MR THORACIC SPINE IMPRESSION Normal MRI of the thoracic spine. Partially imaged LEFT supraclavicular lymphadenopathy highly concerning for metastatic disease in patient with history of breast cancer. MR LUMBAR SPINE IMPRESSION At least 4 intradural, extramedullary nodules highly concerning for drop metastasis, incompletely assessed on this motion degraded noncontrast examination. Grade  1 L4-5 anterolisthesis without spondylolysis. Moderate to severe canal stenosis at L4-5. Moderate LEFT L4-5 and L5-S1 neural foraminal narrowing. 13 mm RIGHT adrenal nodule. Recommend comparison with prior imaging to assess stability (none available at present time) ; in patient with history of cancer, metastasis not excluded. Electronically Signed   By: Elon Alas M.D.   On: 10/18/2015 00:57   Mr Lumbar Spine Wo Contrast  10/18/2015  CLINICAL DATA:  Weakness for 2 days. History of breast cancer and suspected brain metastasis. Worsening  lower extremity weakness, concern for spinal compression. Bowel and bladder incontinence, recurrent falls. EXAM: MRI THORACIC AND LUMBAR SPINE WITHOUT CONTRAST TECHNIQUE: Multiplanar and multiecho pulse sequences of the thoracic and lumbar spine were obtained without intravenous contrast. Interpreting radiologist requested gadolinium administration, however patient could not remain still, and discontinued the examination. COMPARISON:  None available for comparison at time of study interpretation. FINDINGS: MRI THORACIC SPINE FINDINGS ALIGNMENT: Maintenance of the thoracic kyphosis. No malalignment. VERTEBRAE/DISCS: Vertebral bodies are intact. Intervertebral disc morphology's and signal are normal. CORD: Thoracic spinal cord is normal morphology and signal characteristics to the level the conus medullaris which terminates at T12-L1. PREVERTEBRAL AND PARASPINAL SOFT TISSUES: Bilateral dependent atelectasis. Partially imaged LEFT supraclavicular lymphadenopathy measuring up to 14 mm short axis. DEGENERATIVE CHANGE: No degenerative change for age. No neurocompression. MRI LUMBAR SPINE FINDINGS OSSEOUS STRUCTURES: Lumbar vertebral bodies intact. Maintenance of lumbar lordosis. Grade 1 L4-5 anterolisthesis without spondylolysis. Mild L4-5 disc height loss. Decreased T2 signal within all lumbar discs compatible with mild desiccation. No suspicious bone marrow signal. SPINAL CORD: Conus medullaris terminates at L1-2 and demonstrates normal morphology and signal characteristics. At least 4 intermediate T1 and T2 intradural, extramedullary nodules at the conus medullaris and cauda equina from L1-2 at L3, largest measuring 14 mm. SOFT TISSUES: Included prevertebral and paraspinal soft tissues are nonacute. 13 mm RIGHT adrenal nodule. LEVEL BY LEVEL EVALUATION: (severely motion degraded axial T1 limits evaluation) L1 to in L2-3: No disc bulge, canal stenosis nor neural foraminal narrowing. L3-4: Annular bulging. Moderate facet  arthropathy and ligamentum flavum redundancy without canal stenosis or neural foraminal narrowing. L4-5: Anterolisthesis. Small broad-based disc bulge. Severe facet arthropathy and ligamentum flavum redundancy with trace facet effusions which are likely reactive. Moderate to severe canal stenosis. Mild RIGHT, moderate LEFT neural foraminal narrowing. L5-S1: Annular bulging. Severe facet arthropathy and ligamentum flavum redundancy without canal stenosis. Mild RIGHT, moderate LEFT neural foraminal narrowing. IMPRESSION: MR THORACIC SPINE IMPRESSION Normal MRI of the thoracic spine. Partially imaged LEFT supraclavicular lymphadenopathy highly concerning for metastatic disease in patient with history of breast cancer. MR LUMBAR SPINE IMPRESSION At least 4 intradural, extramedullary nodules highly concerning for drop metastasis, incompletely assessed on this motion degraded noncontrast examination. Grade 1 L4-5 anterolisthesis without spondylolysis. Moderate to severe canal stenosis at L4-5. Moderate LEFT L4-5 and L5-S1 neural foraminal narrowing. 13 mm RIGHT adrenal nodule. Recommend comparison with prior imaging to assess stability (none available at present time) ; in patient with history of cancer, metastasis not excluded. Electronically Signed   By: Elon Alas M.D.   On: 10/18/2015 00:57    EKG: Independently reviewed. Rate 78, QTc 467, no ST segment changes.    Assessment/Plan 1. Cauda equina syndrome:  Imaging reviewed with Radiology.  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 dependently seeded metastasis from elsewhere in the neuraxis, given her diffuse cerebral metastatic disease, and could be consistent with a cauda equina syndrome.  Patient is not a surgical  candidate and no other emergent treatments are appropriate.  I am not sure if there is a role for radiation in palliation of her incontinence and leg weakness or not.    These specific findings were  not resulted when I spoke with niece, Francisco Capuchin [phone]), but niece was clear that patient is DNR and likely would not want invasive or overly aggressive measures.  Hospice has not been involved, that I can tell.  -Acetaminophen or hydrocodone for pain -PT eval tomorrow -Consult to Oncology, appreciate cares   2. History of stroke:  -Continue statin and aspirin  3. Severe PCM:  -Continue Ensure  4. Anemia: Suspect bone marrow infiltration.  No history of bleeding or melena.      DVT prophylaxis: Lovenox  Code Status: DO NOT RESUSCITATE  Family Communication: Niece, Francisco Capuchin, [phone].  CODE STATUS confirmed.  MRI results not resulted at the time of my call, but discussed inoperability of spinal mets if present, and likelihood of Onc consult, further steps.  All questions answered.  Disposition Plan: Anticipate Oncology consult.  Likely palliative care consult as well, or Hospice referral. Consults called: None overnight Admission status: Inaptient, med surg   Medical decision making: Patient seen at 1:15 AM on 10/18/2015.  The patient was discussed with Dr. Jarold Song. What exists of the patient's chart and CareEverywhere was reviewed in depth.  Clinical condition: stable.        Edwin Dada [facility] Pager [phone]

## 2015-10-18 NOTE — ED Notes (Signed)
IV team at bedside 

## 2015-10-19 DIAGNOSIS — C7931 Secondary malignant neoplasm of brain: Secondary | ICD-10-CM | POA: Diagnosis present

## 2015-10-19 DIAGNOSIS — C7949 Secondary malignant neoplasm of other parts of nervous system: Principal | ICD-10-CM

## 2015-10-19 DIAGNOSIS — Z9012 Acquired absence of left breast and nipple: Secondary | ICD-10-CM | POA: Diagnosis not present

## 2015-10-19 DIAGNOSIS — I10 Essential (primary) hypertension: Secondary | ICD-10-CM | POA: Diagnosis present

## 2015-10-19 DIAGNOSIS — R413 Other amnesia: Secondary | ICD-10-CM | POA: Diagnosis present

## 2015-10-19 DIAGNOSIS — Z923 Personal history of irradiation: Secondary | ICD-10-CM | POA: Diagnosis not present

## 2015-10-19 DIAGNOSIS — C50812 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: Secondary | ICD-10-CM | POA: Diagnosis not present

## 2015-10-19 DIAGNOSIS — Z6821 Body mass index (BMI) 21.0-21.9, adult: Secondary | ICD-10-CM | POA: Diagnosis not present

## 2015-10-19 DIAGNOSIS — E43 Unspecified severe protein-calorie malnutrition: Secondary | ICD-10-CM | POA: Diagnosis present

## 2015-10-19 DIAGNOSIS — Z853 Personal history of malignant neoplasm of breast: Secondary | ICD-10-CM | POA: Diagnosis not present

## 2015-10-19 DIAGNOSIS — G834 Cauda equina syndrome: Secondary | ICD-10-CM | POA: Diagnosis present

## 2015-10-19 DIAGNOSIS — Z66 Do not resuscitate: Secondary | ICD-10-CM | POA: Diagnosis present

## 2015-10-19 DIAGNOSIS — R32 Unspecified urinary incontinence: Secondary | ICD-10-CM | POA: Diagnosis present

## 2015-10-19 DIAGNOSIS — Z87891 Personal history of nicotine dependence: Secondary | ICD-10-CM | POA: Diagnosis not present

## 2015-10-19 DIAGNOSIS — C7951 Secondary malignant neoplasm of bone: Secondary | ICD-10-CM | POA: Diagnosis present

## 2015-10-19 DIAGNOSIS — Z79899 Other long term (current) drug therapy: Secondary | ICD-10-CM | POA: Diagnosis not present

## 2015-10-19 DIAGNOSIS — Z515 Encounter for palliative care: Secondary | ICD-10-CM | POA: Diagnosis not present

## 2015-10-19 DIAGNOSIS — Z9221 Personal history of antineoplastic chemotherapy: Secondary | ICD-10-CM | POA: Diagnosis not present

## 2015-10-19 DIAGNOSIS — R531 Weakness: Secondary | ICD-10-CM | POA: Diagnosis present

## 2015-10-19 DIAGNOSIS — D630 Anemia in neoplastic disease: Secondary | ICD-10-CM | POA: Diagnosis present

## 2015-10-19 DIAGNOSIS — Z8673 Personal history of transient ischemic attack (TIA), and cerebral infarction without residual deficits: Secondary | ICD-10-CM | POA: Diagnosis not present

## 2015-10-19 DIAGNOSIS — Z7982 Long term (current) use of aspirin: Secondary | ICD-10-CM | POA: Diagnosis not present

## 2015-10-19 DIAGNOSIS — R296 Repeated falls: Secondary | ICD-10-CM | POA: Diagnosis present

## 2015-10-19 DIAGNOSIS — R159 Full incontinence of feces: Secondary | ICD-10-CM | POA: Diagnosis present

## 2015-10-19 DIAGNOSIS — Z7189 Other specified counseling: Secondary | ICD-10-CM | POA: Diagnosis not present

## 2015-10-19 LAB — CBC
HEMATOCRIT: 27.3 % — AB (ref 36.0–46.0)
Hemoglobin: 8.7 g/dL — ABNORMAL LOW (ref 12.0–15.0)
MCH: 26.8 pg (ref 26.0–34.0)
MCHC: 31.9 g/dL (ref 30.0–36.0)
MCV: 84 fL (ref 78.0–100.0)
Platelets: 586 10*3/uL — ABNORMAL HIGH (ref 150–400)
RBC: 3.25 MIL/uL — AB (ref 3.87–5.11)
RDW: 14.9 % (ref 11.5–15.5)
WBC: 11.2 10*3/uL — AB (ref 4.0–10.5)

## 2015-10-19 LAB — BASIC METABOLIC PANEL
Anion gap: 11 (ref 5–15)
BUN: 20 mg/dL (ref 6–20)
CHLORIDE: 105 mmol/L (ref 101–111)
CO2: 20 mmol/L — AB (ref 22–32)
Calcium: 9.2 mg/dL (ref 8.9–10.3)
Creatinine, Ser: 0.84 mg/dL (ref 0.44–1.00)
GFR calc Af Amer: >60 mL/min (ref >60)
GFR calc non Af Amer: >60 mL/min (ref >60)
Glucose, Bld: 150 mg/dL — ABNORMAL HIGH (ref 65–99)
POTASSIUM: 4.5 mmol/L (ref 3.5–5.1)
SODIUM: 136 mmol/L (ref 135–145)

## 2015-10-19 LAB — HAPTOGLOBIN: Haptoglobin: 771 mg/dL — ABNORMAL HIGH (ref 34–200)

## 2015-10-19 MED ORDER — SODIUM CHLORIDE 0.9 % IV SOLN
INTRAVENOUS | Status: DC
  Administered 2015-10-19: 22:00:00 via INTRAVENOUS

## 2015-10-19 MED ORDER — DEXAMETHASONE SODIUM PHOSPHATE 4 MG/ML IJ SOLN
4.0000 mg | Freq: Two times a day (BID) | INTRAMUSCULAR | Status: DC
  Administered 2015-10-19 – 2015-10-20 (×2): 4 mg via INTRAVENOUS
  Filled 2015-10-19 (×2): qty 1

## 2015-10-19 NOTE — Consult Note (Signed)
Radiation Oncology         [phone]) [phone] ________________________________  Inpatient Consultation  Name: Robin Cantrell MRN: [id number]  Date: 10/17/2015  DOB: 30-Jan-1946  CC:Pcp Not In System  No ref. provider found   REFERRING PHYSICIAN: No ref. 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    ICD-9-CM ICD-10-CM   1. Weakness 780.79 R53.1 MR Thoracic Spine Wo Contrast     MR Lumbar Spine Wo Contrast     MR Thoracic Spine Wo Contrast     MR Lumbar Spine Wo Contrast  2. Weakness 780.79 R53.1 MR Thoracic Spine Wo Contrast     MR Lumbar Spine Wo Contrast     MR Thoracic Spine Wo Contrast     MR Lumbar Spine Wo Contrast    HISTORY OF PRESENT ILLNESS: Robin Cantrell is a 70 y.o. female seen at the request of Dr. Carles Collet for consideration of radiotherapy to the lumbar spine due to drop metastases. Robin Cantrell was originally seen by our service back in February 2017, when she had presented with a large mass in the left breast with axillary adenopathy consistent with stage IV breast cancer. At the time of her diagnosis was also found to have multiple lesions in the brain consistent with metastatic disease. She was counseled on the rationale for radiotherapy, and underwent radiation to the whole brain with 30 Gy in 10 fractions.  She has been under the care of Dr. Lindi Adie and medical oncology and has been receiving palliative chemotherapy however has been unsuccessful and tolerating this due to poor tolerance and multiple infections requiring hospitalizations. Apparently about 2 weeks ago she started experiencing lower extremity weakness, and on the day of her admission had been experiencing such weakness that she was unable to get out of bed. Her family contact Dr. Lindi Adie and it was recommended that she be further evaluated.  A CT of the brain without contrast did not reveal any new explanation for her symptoms, and an MRI of the thoracic and lumbar spine was then performed. MRI of the thoracic spine was negative for abnormality, and there appear to be at least 4 intermediate T1 and T2 intradural extra medullary nodules of the conus medullaris and cauda equina from the level of L1-2 to L3 with the largest measuring 1.4 cm. Further down there is annular bulging of L3-4, and a small broad-based disc bulge at L4-L5 with facet arthropathy and moderate to severe canal stenosis mild on the right with moderate neural foraminal foraminal narrowing. There is also neural foraminal narrowing mild on the right and moderate on the left from L5-S1. Because of this concern a cauda equina syndrome are asked to see the patient for further recommendations of radiotherapy care. She has a palliative care consult pending as well.   PREVIOUS RADIATION THERAPY: Yes 30 Gy in 10 fractions to the whole brain between 06/13/2015 and 06/29/2015  PAST MEDICAL HISTORY:  Past Medical History  Diagnosis Date  . Stroke ([place]) 06/10/15    Acute Ischemic attack  . Cancer [facility])     mestatic breast      PAST SURGICAL HISTORY: Past Surgical History  Procedure Laterality Date  . Ankle fracture surgery    . Total mastectomy Left 06/02/2015    Procedure: LEFT TOTAL MASTECTOMY;  Surgeon: Donnie Mesa, MD;  Location: [place];  Service:  General;  Laterality: Left;  . Tee without cardioversion N/A 06/06/2015    Procedure: TRANSESOPHAGEAL ECHOCARDIOGRAM (TEE);  Surgeon: Skeet Latch, MD;  Location: [place];  Service: Cardiovascular;  Laterality: N/A;  . Incision and drainage of wound Left 06/08/2015    Procedure: IRRIGATION AND DEBRIDEMENT LEFT BREAST WOUND WITH A CELL PLACEMENT AND VAC;  Surgeon: Loel Lofty Dillingham, DO;  Location: [place];  Service: Plastics;  Laterality: Left;  . Application of a-cell of chest/abdomen Left 06/08/2015    Procedure: WITH A CELL  PLACEMENT;  Surgeon: Loel Lofty Dillingham, DO;  Location: [place];  Service: Plastics;  Laterality: Left;  . Application of wound vac Left 06/08/2015    Procedure: AND VAC;  Surgeon: Loel Lofty Dillingham, DO;  Location: [place];  Service: Plastics;  Laterality: Left;  . Skin split graft Left 06/26/2015    Procedure: SKIN GRAFT TO LEFT BREAST WITH ACELL AND VAC;  Surgeon: Loel Lofty Dillingham, DO;  Location: [place];  Service: Plastics;  Laterality: Left;  . Portacath placement Right 07/11/2015    Procedure: INSERTION PORT-A-CATH;  Surgeon: Donnie Mesa, MD;  Location: [place];  Service: General;  Laterality: Right;    FAMILY HISTORY: History reviewed. No pertinent family history.  SOCIAL HISTORY:  Social History   Social History  . Marital Status: Single    Spouse Name: N/A  . Number of Children: N/A  . Years of Education: N/A   Occupational History  . Not on file.   Social History Main Topics  . Smoking status: Former Smoker    Types: Cigarettes    Quit date: 06/04/1993  . Smokeless tobacco: Not on file  . Alcohol Use: No  . Drug Use: No  . Sexual Activity: Not on file   Other Topics Concern  . Not on file   Social History Narrative  The patient was previously homeless residing in her car for several months, she states that she is currently living independently.   ALLERGIES: Review of patient's allergies indicates no known allergies.  MEDICATIONS:  Current Facility-Administered Medications  Medication Dose Route Frequency Provider Last Rate Last Dose  . acetaminophen (TYLENOL) tablet 650 mg  650 mg Oral Q6H PRN Edwin Dada, MD       Or  . acetaminophen (TYLENOL) suppository 650 mg  650 mg Rectal Q6H PRN Edwin Dada, MD      . aspirin EC tablet 81 mg  81 mg Oral Daily Edwin Dada, MD   81 mg at 10/18/15 1104  . dexamethasone (DECADRON) tablet 4 mg  4 mg Oral Q12H Orson Eva, MD   4 mg at 10/18/15 2144  . feeding supplement (ENSURE ENLIVE) (ENSURE  ENLIVE) liquid 237 mL  237 mL Oral BID BM Edwin Dada, MD   237 mL at 10/18/15 1400  . HYDROcodone-acetaminophen (NORCO/VICODIN) 5-325 MG per tablet 1-2 tablet  1-2 tablet Oral Q4H PRN Edwin Dada, MD      . ondansetron (ZOFRAN) tablet 4 mg  4 mg Oral Q6H PRN Edwin Dada, MD       Or  . ondansetron (ZOFRAN) injection 4 mg  4 mg Intravenous Q6H PRN Edwin Dada, MD      . senna-docusate (Senokot-S) tablet 1 tablet  1 tablet Oral QHS PRN Edwin Dada, MD      . simvastatin (ZOCOR) tablet 10 mg  10 mg Oral q1800 Edwin Dada, MD   10 mg at 10/18/15 1712  . sodium chloride  flush (NS) 0.9 % injection 10-40 mL  10-40 mL Intracatheter PRN Edwin Dada, MD        REVIEW OF SYSTEMS:  On review of systems, the patient reports that Overall she is doing pretty well. She states that she has noticed weakness that she feels that this is somewhat improved with steroids that she is receiving, and she is currently receiving 4 mg of dexamethasone twice a day. She denies any changes in her sensation of her lower extremities, and specifically denies any bowel or bladder incontinence despite that this was originally noted in the H&P during her admission. She denies any numbness in the sagittal distribution. She is not experiencing any pain. She is able to walk unassisted in the hall. She is also transferring back-and-forth to the bedside commode and to a chair. She denies any falls.   PHYSICAL EXAM:  height is 5\' 8"  (1.727 m) and weight is 142 lb 6.7 oz (64.6 kg). Her oral temperature is 97.5 F (36.4 C). Her blood pressure is 109/82 and her pulse is 71. Her respiration is 18 and oxygen saturation is 97%.   Pain Scale 0/10 In general this is a Chronically ill-appearing African-American female in no acute distress. She is alert and oriented x4 and appropriate throughout the examination. HEENT reveals that the patient is normocephalic, atraumatic.  Skin is  intact of her upper and lower extremities without lesion. Cardiopulmonary assessment does not reveal any acute distress and she exhibits normal effort. Lower extremities are negative for pretibial pitting edema, deep calf tenderness, cyanosis or clubbing. She has good strength of her lower extremities, 5 out of 5 bilaterally, without sensory deficit with light touch over her feet, or lower extremities bilaterally.   KPS = 70 100 - Normal; no complaints; no evidence of disease. 90   - Able to carry on normal activity; minor signs or symptoms of disease. 80   - Normal activity with effort; some signs or symptoms of disease. 47   - Cares for self; unable to carry on normal activity or to do active work. 60   - Requires occasional assistance, but is able to care for most of his personal needs. 50   - Requires considerable assistance and frequent medical care. 76   - Disabled; requires special care and assistance. 32   - Severely disabled; hospital admission is indicated although death not imminent. 60   - Very sick; hospital admission necessary; active supportive treatment necessary. 10   - Moribund; fatal processes progressing rapidly. 0     - Dead  Karnofsky DA, Abelmann [place], Craver LS and Burchenal JH [phone]) The use of the nitrogen mustards in the palliative treatment of carcinoma: with particular reference to bronchogenic carcinoma Cancer 1 634-56  LABORATORY DATA:  Lab Results  Component Value Date   WBC 11.2* 10/19/2015   HGB 8.7* 10/19/2015   HCT 27.3* 10/19/2015   MCV 84.0 10/19/2015   PLT 586* 10/19/2015   Lab Results  Component Value Date   NA 136 10/19/2015   K 4.5 10/19/2015   CL 105 10/19/2015   CO2 20* 10/19/2015   Lab Results  Component Value Date   ALT 20 08/29/2015   AST 32 08/29/2015   ALKPHOS 62 08/29/2015   BILITOT 0.34 08/29/2015     RADIOGRAPHY: Dg Chest 2 View  10/17/2015  CLINICAL DATA:  Fatigue for 1 week, history of left breast cancer with mastectomy  EXAM: CHEST  2 VIEW COMPARISON:  None. FINDINGS: Cardiomediastinal silhouette  is unremarkable. No acute infiltrate or pleural effusion. No pulmonary edema. There is right subclavian Port-A-Cath with tip in SVC. The patient is status post left mastectomy and left axillary lymph node dissection. No pneumothorax. IMPRESSION: No active cardiopulmonary disease. Right Port-A-Cath in place. No pneumothorax. Electronically Signed   By: Lahoma Crocker M.D.   On: 10/17/2015 20:18   Ct Head Wo Contrast  10/17/2015  CLINICAL DATA:  Confusion.  Mental status change. EXAM: CT HEAD WITHOUT CONTRAST TECHNIQUE: Contiguous axial images were obtained from the base of the skull through the vertex without intravenous contrast. COMPARISON:  None. FINDINGS: The ventricles are in the midline without mass effect or shift. No extra-axial fluid collections are identified. There is fairly extensive, age advanced, periventricular white matter disease which is likely microvascular ischemic change. Probable remote cortical infarct involving the left frontal parietal area and also the right occipital area. No findings for acute hemispheric infarction or intracranial hemorrhage. The brainstem and cerebellum are grossly normal. There is a calcified extra-axial mass in the right parietal area which measures 3 cm. This is most likely a benign meningioma. The bony structures are intact. The paranasal sinuses and mastoid air cells are clear. The globes are intact. IMPRESSION: Severe age advanced periventricular white matter disease and probable remote infarcts involving the left frontal parietal area and right occipital area. 3 cm calcified extra-axial mass in the right parietal area, most likely a benign meningioma. Electronically Signed   By: Marijo Sanes M.D.   On: 10/17/2015 20:55   Mr Thoracic Spine Wo Contrast  10/18/2015  CLINICAL DATA:  Weakness for 2 days. History of breast cancer and suspected brain metastasis. Worsening lower extremity  weakness, concern for spinal compression. Bowel and bladder incontinence, recurrent falls. EXAM: MRI THORACIC AND LUMBAR SPINE WITHOUT CONTRAST TECHNIQUE: Multiplanar and multiecho pulse sequences of the thoracic and lumbar spine were obtained without intravenous contrast. Interpreting radiologist requested gadolinium administration, however patient could not remain still, and discontinued the examination. COMPARISON:  None available for comparison at time of study interpretation. FINDINGS: MRI THORACIC SPINE FINDINGS ALIGNMENT: Maintenance of the thoracic kyphosis. No malalignment. VERTEBRAE/DISCS: Vertebral bodies are intact. Intervertebral disc morphology's and signal are normal. CORD: Thoracic spinal cord is normal morphology and signal characteristics to the level the conus medullaris which terminates at T12-L1. PREVERTEBRAL AND PARASPINAL SOFT TISSUES: Bilateral dependent atelectasis. Partially imaged LEFT supraclavicular lymphadenopathy measuring up to 14 mm short axis. DEGENERATIVE CHANGE: No degenerative change for age. No neurocompression. MRI LUMBAR SPINE FINDINGS OSSEOUS STRUCTURES: Lumbar vertebral bodies intact. Maintenance of lumbar lordosis. Grade 1 L4-5 anterolisthesis without spondylolysis. Mild L4-5 disc height loss. Decreased T2 signal within all lumbar discs compatible with mild desiccation. No suspicious bone marrow signal. SPINAL CORD: Conus medullaris terminates at L1-2 and demonstrates normal morphology and signal characteristics. At least 4 intermediate T1 and T2 intradural, extramedullary nodules at the conus medullaris and cauda equina from L1-2 at L3, largest measuring 14 mm. SOFT TISSUES: Included prevertebral and paraspinal soft tissues are nonacute. 13 mm RIGHT adrenal nodule. LEVEL BY LEVEL EVALUATION: (severely motion degraded axial T1 limits evaluation) L1 to in L2-3: No disc bulge, canal stenosis nor neural foraminal narrowing. L3-4: Annular bulging. Moderate facet arthropathy and  ligamentum flavum redundancy without canal stenosis or neural foraminal narrowing. L4-5: Anterolisthesis. Small broad-based disc bulge. Severe facet arthropathy and ligamentum flavum redundancy with trace facet effusions which are likely reactive. Moderate to severe canal stenosis. Mild RIGHT, moderate LEFT neural foraminal narrowing. L5-S1: Annular bulging. Severe facet  arthropathy and ligamentum flavum redundancy without canal stenosis. Mild RIGHT, moderate LEFT neural foraminal narrowing. IMPRESSION: MR THORACIC SPINE IMPRESSION Normal MRI of the thoracic spine. Partially imaged LEFT supraclavicular lymphadenopathy highly concerning for metastatic disease in patient with history of breast cancer. MR LUMBAR SPINE IMPRESSION At least 4 intradural, extramedullary nodules highly concerning for drop metastasis, incompletely assessed on this motion degraded noncontrast examination. Grade 1 L4-5 anterolisthesis without spondylolysis. Moderate to severe canal stenosis at L4-5. Moderate LEFT L4-5 and L5-S1 neural foraminal narrowing. 13 mm RIGHT adrenal nodule. Recommend comparison with prior imaging to assess stability (none available at present time) ; in patient with history of cancer, metastasis not excluded. Electronically Signed   By: Elon Alas M.D.   On: 10/18/2015 00:57   Mr Lumbar Spine Wo Contrast  10/18/2015  CLINICAL DATA:  Weakness for 2 days. History of breast cancer and suspected brain metastasis. Worsening lower extremity weakness, concern for spinal compression. Bowel and bladder incontinence, recurrent falls. EXAM: MRI THORACIC AND LUMBAR SPINE WITHOUT CONTRAST TECHNIQUE: Multiplanar and multiecho pulse sequences of the thoracic and lumbar spine were obtained without intravenous contrast. Interpreting radiologist requested gadolinium administration, however patient could not remain still, and discontinued the examination. COMPARISON:  None available for comparison at time of study  interpretation. FINDINGS: MRI THORACIC SPINE FINDINGS ALIGNMENT: Maintenance of the thoracic kyphosis. No malalignment. VERTEBRAE/DISCS: Vertebral bodies are intact. Intervertebral disc morphology's and signal are normal. CORD: Thoracic spinal cord is normal morphology and signal characteristics to the level the conus medullaris which terminates at T12-L1. PREVERTEBRAL AND PARASPINAL SOFT TISSUES: Bilateral dependent atelectasis. Partially imaged LEFT supraclavicular lymphadenopathy measuring up to 14 mm short axis. DEGENERATIVE CHANGE: No degenerative change for age. No neurocompression. MRI LUMBAR SPINE FINDINGS OSSEOUS STRUCTURES: Lumbar vertebral bodies intact. Maintenance of lumbar lordosis. Grade 1 L4-5 anterolisthesis without spondylolysis. Mild L4-5 disc height loss. Decreased T2 signal within all lumbar discs compatible with mild desiccation. No suspicious bone marrow signal. SPINAL CORD: Conus medullaris terminates at L1-2 and demonstrates normal morphology and signal characteristics. At least 4 intermediate T1 and T2 intradural, extramedullary nodules at the conus medullaris and cauda equina from L1-2 at L3, largest measuring 14 mm. SOFT TISSUES: Included prevertebral and paraspinal soft tissues are nonacute. 13 mm RIGHT adrenal nodule. LEVEL BY LEVEL EVALUATION: (severely motion degraded axial T1 limits evaluation) L1 to in L2-3: No disc bulge, canal stenosis nor neural foraminal narrowing. L3-4: Annular bulging. Moderate facet arthropathy and ligamentum flavum redundancy without canal stenosis or neural foraminal narrowing. L4-5: Anterolisthesis. Small broad-based disc bulge. Severe facet arthropathy and ligamentum flavum redundancy with trace facet effusions which are likely reactive. Moderate to severe canal stenosis. Mild RIGHT, moderate LEFT neural foraminal narrowing. L5-S1: Annular bulging. Severe facet arthropathy and ligamentum flavum redundancy without canal stenosis. Mild RIGHT, moderate LEFT  neural foraminal narrowing. IMPRESSION: MR THORACIC SPINE IMPRESSION Normal MRI of the thoracic spine. Partially imaged LEFT supraclavicular lymphadenopathy highly concerning for metastatic disease in patient with history of breast cancer. MR LUMBAR SPINE IMPRESSION At least 4 intradural, extramedullary nodules highly concerning for drop metastasis, incompletely assessed on this motion degraded noncontrast examination. Grade 1 L4-5 anterolisthesis without spondylolysis. Moderate to severe canal stenosis at L4-5. Moderate LEFT L4-5 and L5-S1 neural foraminal narrowing. 13 mm RIGHT adrenal nodule. Recommend comparison with prior imaging to assess stability (none available at present time) ; in patient with history of cancer, metastasis not excluded. Electronically Signed   By: Elon Alas M.D.   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  PLAN: Dr. Tammi Klippel in alignment with the patient this morning to discuss the findings from her imaging studies, as well as review the clinical picture of her disease unfortunately she has been a poor candidate to consider additional chemotherapy, and did not tolerate this well due to multiple infections and poor tolerance. Dr. Tammi Klippel discusses that although radiographically there are concerns for development of cauda equina syndrome, the patient appears to be neurologically intact and her ability to continue to walk is preserved. He discussed with her that there would be options to consider treatment to the lumbar spine and a course of 10 fractions, however it would also be reasonable to consider hospice and comfort care rather than proceeding with radiotherapy. The patient is going to consider this to meet with palliative care to establish goals of her care. She is not decided at this point in  time which route she would like to pursue. We appreciate the opportunity to see her, and we'll be available should she decide to pursue radiation.     Carola Rhine, PAC

## 2015-10-19 NOTE — Progress Notes (Signed)
Palliative Medicine Team consult was received.  I met briefly with patient today.  She would like for her family to be present prior to any further discussions.  I have scheduled a meeting with patient and her family for 10am tomorrow. If there are urgent needs or questions please call [phone]. Thank you for consulting out team to assist with this patients care.  Micheline Rough, MD [facility] [phone]

## 2015-10-19 NOTE — Progress Notes (Signed)
PROGRESS NOTE  DOTTY BOATRIGHT [id number] DOB: Oct 21, 1945 DOA: 10/17/2015 PCP: Pcp Not In System  Brief History:  70 y.o. female with a past medical history significant for breast cancer T4b N0i+M1 ER/PR neg, HTN and hx of stroke who presents with falls and incontinence. Pt is a poor historian. [place] chemo 07/18/15 to 08/14/15. The patient presents with two-week history of increasing lower extremity weakness with inability to get up out of bed. In addition, the patient has had new bowel or bladder incontinence. The patient is two-day history of gait instability and falls. Dr. Charlesetta Garibaldi office was contacted who was concerned aboutconcerned about cauda equina or progression of brain metastasis and recommended hospital evaluation. MRI of lumbar spine revealed intradural extramedullary nodules concerning for drop metastasis adherent to the cauda equina.  Assessment/Plan: Cauda equina syndrome:  -Dr. Loleta Books reviewed Imaging reviewed with Radiology. 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 dependently seeded metastasis from elsewhere in the neuraxis, given her diffuse cerebral metastatic disease, and could be consistent with a cauda equina syndrome.  -Patient is not a surgical candidate and no other emergent treatments are appropriate. -case discussed with Dr. Clayborne Dana good candidate for chemo -consulted palliative medicine for GOC--case discussed with Dr. Domingo Cocking -appreciate radiation onc input-would also be reasonable to consider hospice and comfort care rather than proceeding with radiotherapy- -niece, Francisco Capuchin [phone]), but niece was clear that patient is DNR and likely would not want invasive or overly aggressive measures. Hospice has not been involved, that I can tell.  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 No history of bleeding or melena.     Disposition Plan: Home with hospice 6/23 if stable Family Communication: No Family at bedside   Consultants: Medical oncology--Dr. Lindi Adie, palliative medicine  Code Status: DNR  DVT Prophylaxis: SCDs   Procedures: As Listed in Progress Note Above  Antibiotics: None  Subjective: Patient denies fevers, chills, headache, chest pain, dyspnea, nausea, vomiting, diarrhea, abdominal pain, dysuria, hematuria   Objective: Filed Vitals:   10/18/15 2239 10/19/15 0209 10/19/15 0617 10/19/15 1450  BP: 122/75 137/82 109/82 108/67  Pulse: 83 79 71 70  Temp: 99.4 F (37.4 C) 97.8 F (36.6 C) 97.5 F (36.4 C) 97 F (36.1 C)  TempSrc: Oral Oral Oral Oral  Resp: 18 18 18 18   Height:      Weight:      SpO2: 97% 100% 97% 98%   No intake or output data in the 24 hours ending 10/19/15 1819 Weight change:  Exam:   General:  Pt is alert, follows commands appropriately, not in acute distress  HEENT: No icterus, No thrush, No neck mass, [place]/AT  Cardiovascular: RRR, S1/S2, no rubs, no gallops  Respiratory: CTA bilaterally, no wheezing, no crackles, no rhonchi  Abdomen: Soft/+BS, non tender, non distended, no guarding  Extremities: No edema, No lymphangitis, No petechiae, No rashes, no synovitis   Data Reviewed: I have personally reviewed following labs and imaging studies Basic Metabolic Panel:  Recent Labs Lab 10/17/15 2144 10/18/15 0520 10/19/15 0425  NA 136  --  136  K 4.2  --  4.5  CL 107  --  105  CO2 19*  --  20*  GLUCOSE 97  --  150*  BUN 16  --  20  CREATININE 0.91 0.59 0.84  CALCIUM 9.4  --  9.2   Liver Function Tests: No results for input(s): AST, ALT, ALKPHOS, BILITOT, PROT, ALBUMIN in the  last 168 hours. No results for input(s): LIPASE, AMYLASE in the last 168 hours. No results for input(s): AMMONIA in the last 168 hours. Coagulation Profile: No results for input(s): INR, PROTIME in the last 168 hours. CBC:  Recent Labs Lab 10/17/15 2144 10/18/15 0520 10/18/15 0848 10/19/15 0425  WBC 11.6* 8.6  --  11.2*  HGB 8.7* 6.7* 9.0* 8.7*  HCT 26.9* 20.6* 27.8* 27.3*  MCV 83.8 85.5  --  84.0  PLT 574* 400  --  586*   Cardiac Enzymes: No results for input(s): CKTOTAL, CKMB, CKMBINDEX, TROPONINI in the last 168 hours. BNP: Invalid input(s): POCBNP CBG: No results for input(s): GLUCAP in the last 168 hours. HbA1C: No results for input(s): HGBA1C in the last 72 hours. Urine analysis:    Component Value Date/Time   COLORURINE YELLOW 10/17/2015 1845   APPEARANCEUR CLEAR 10/17/2015 1845   LABSPEC 1.023 10/17/2015 1845   PHURINE 5.5 10/17/2015 1845   GLUCOSEU NEGATIVE 10/17/2015 1845   HGBUR NEGATIVE 10/17/2015 1845   BILIRUBINUR NEGATIVE 10/17/2015 1845   KETONESUR NEGATIVE 10/17/2015 1845   PROTEINUR NEGATIVE 10/17/2015 1845   NITRITE NEGATIVE 10/17/2015 1845   LEUKOCYTESUR NEGATIVE 10/17/2015 1845   Sepsis Labs: @LABRCNTIP (procalcitonin:4,lacticidven:4) )No results found for this or any previous visit (from the past 240 hour(s)).   Scheduled Meds: . aspirin EC  81 mg Oral Daily  . dexamethasone  4 mg Intravenous Q12H  . feeding supplement (ENSURE ENLIVE)  237 mL Oral BID BM  . simvastatin  10 mg Oral q1800   Continuous Infusions:   Procedures/Studies: Dg Chest 2 View  10/17/2015  CLINICAL DATA:  Fatigue for 1 week, history of left breast cancer with mastectomy EXAM: CHEST  2 VIEW COMPARISON:  None. FINDINGS: Cardiomediastinal silhouette is unremarkable. No acute infiltrate or pleural effusion. No pulmonary edema. There is right subclavian Port-A-Cath with tip in SVC. The patient is status post left mastectomy and left axillary lymph node dissection. No  pneumothorax. IMPRESSION: No active cardiopulmonary disease. Right Port-A-Cath in place. No pneumothorax. Electronically Signed   By: Lahoma Crocker M.D.   On: 10/17/2015 20:18   Ct Head Wo Contrast  10/17/2015  CLINICAL DATA:  Confusion.  Mental status change. EXAM: CT HEAD WITHOUT CONTRAST TECHNIQUE: Contiguous axial images were obtained from the base of the skull through the vertex without intravenous contrast. COMPARISON:  None. FINDINGS: The ventricles are in the midline without mass effect or shift. No extra-axial fluid collections are identified. There is fairly extensive, age advanced, periventricular white matter disease which is likely microvascular ischemic change. Probable remote cortical infarct involving the left frontal parietal area and also the right occipital area. No findings for acute hemispheric infarction or intracranial hemorrhage. The brainstem and cerebellum are grossly normal. There is a calcified extra-axial mass in the right parietal area which measures 3 cm. This is most likely a benign meningioma. The bony structures are intact. The paranasal sinuses and mastoid air cells are clear. The globes are intact. IMPRESSION: Severe age advanced periventricular white matter disease and probable remote infarcts involving the left frontal parietal area and right occipital area. 3 cm calcified extra-axial mass in the right parietal area, most likely a benign meningioma.  Electronically Signed   By: Marijo Sanes M.D.   On: 10/17/2015 20:55   Mr Thoracic Spine Wo Contrast  10/18/2015  CLINICAL DATA:  Weakness for 2 days. History of breast cancer and suspected brain metastasis. Worsening lower extremity weakness, concern for spinal compression. Bowel and bladder incontinence, recurrent falls. EXAM: MRI THORACIC AND LUMBAR SPINE WITHOUT CONTRAST TECHNIQUE: Multiplanar and multiecho pulse sequences of the thoracic and lumbar spine were obtained without intravenous contrast. Interpreting radiologist  requested gadolinium administration, however patient could not remain still, and discontinued the examination. COMPARISON:  None available for comparison at time of study interpretation. FINDINGS: MRI THORACIC SPINE FINDINGS ALIGNMENT: Maintenance of the thoracic kyphosis. No malalignment. VERTEBRAE/DISCS: Vertebral bodies are intact. Intervertebral disc morphology's and signal are normal. CORD: Thoracic spinal cord is normal morphology and signal characteristics to the level the conus medullaris which terminates at T12-L1. PREVERTEBRAL AND PARASPINAL SOFT TISSUES: Bilateral dependent atelectasis. Partially imaged LEFT supraclavicular lymphadenopathy measuring up to 14 mm short axis. DEGENERATIVE CHANGE: No degenerative change for age. No neurocompression. MRI LUMBAR SPINE FINDINGS OSSEOUS STRUCTURES: Lumbar vertebral bodies intact. Maintenance of lumbar lordosis. Grade 1 L4-5 anterolisthesis without spondylolysis. Mild L4-5 disc height loss. Decreased T2 signal within all lumbar discs compatible with mild desiccation. No suspicious bone marrow signal. SPINAL CORD: Conus medullaris terminates at L1-2 and demonstrates normal morphology and signal characteristics. At least 4 intermediate T1 and T2 intradural, extramedullary nodules at the conus medullaris and cauda equina from L1-2 at L3, largest measuring 14 mm. SOFT TISSUES: Included prevertebral and paraspinal soft tissues are nonacute. 13 mm RIGHT adrenal nodule. LEVEL BY LEVEL EVALUATION: (severely motion degraded axial T1 limits evaluation) L1 to in L2-3: No disc bulge, canal stenosis nor neural foraminal narrowing. L3-4: Annular bulging. Moderate facet arthropathy and ligamentum flavum redundancy without canal stenosis or neural foraminal narrowing. L4-5: Anterolisthesis. Small broad-based disc bulge. Severe facet arthropathy and ligamentum flavum redundancy with trace facet effusions which are likely reactive. Moderate to severe canal stenosis. Mild RIGHT,  moderate LEFT neural foraminal narrowing. L5-S1: Annular bulging. Severe facet arthropathy and ligamentum flavum redundancy without canal stenosis. Mild RIGHT, moderate LEFT neural foraminal narrowing. IMPRESSION: MR THORACIC SPINE IMPRESSION Normal MRI of the thoracic spine. Partially imaged LEFT supraclavicular lymphadenopathy highly concerning for metastatic disease in patient with history of breast cancer. MR LUMBAR SPINE IMPRESSION At least 4 intradural, extramedullary nodules highly concerning for drop metastasis, incompletely assessed on this motion degraded noncontrast examination. Grade 1 L4-5 anterolisthesis without spondylolysis. Moderate to severe canal stenosis at L4-5. Moderate LEFT L4-5 and L5-S1 neural foraminal narrowing. 13 mm RIGHT adrenal nodule. Recommend comparison with prior imaging to assess stability (none available at present time) ; in patient with history of cancer, metastasis not excluded. Electronically Signed   By: Elon Alas M.D.   On: 10/18/2015 00:57   Mr Lumbar Spine Wo Contrast  10/18/2015  CLINICAL DATA:  Weakness for 2 days. History of breast cancer and suspected brain metastasis. Worsening lower extremity weakness, concern for spinal compression. Bowel and bladder incontinence, recurrent falls. EXAM: MRI THORACIC AND LUMBAR SPINE WITHOUT CONTRAST TECHNIQUE: Multiplanar and multiecho pulse sequences of the thoracic and lumbar spine were obtained without intravenous contrast. Interpreting radiologist requested gadolinium administration, however patient could not remain still, and discontinued the examination. COMPARISON:  None available for comparison at time of study interpretation. FINDINGS: MRI THORACIC SPINE FINDINGS ALIGNMENT: Maintenance of the thoracic kyphosis. No malalignment. VERTEBRAE/DISCS: Vertebral bodies are intact. Intervertebral disc morphology's and signal are normal. CORD: Thoracic  spinal cord is normal morphology and signal characteristics to the  level the conus medullaris which terminates at T12-L1. PREVERTEBRAL AND PARASPINAL SOFT TISSUES: Bilateral dependent atelectasis. Partially imaged LEFT supraclavicular lymphadenopathy measuring up to 14 mm short axis. DEGENERATIVE CHANGE: No degenerative change for age. No neurocompression. MRI LUMBAR SPINE FINDINGS OSSEOUS STRUCTURES: Lumbar vertebral bodies intact. Maintenance of lumbar lordosis. Grade 1 L4-5 anterolisthesis without spondylolysis. Mild L4-5 disc height loss. Decreased T2 signal within all lumbar discs compatible with mild desiccation. No suspicious bone marrow signal. SPINAL CORD: Conus medullaris terminates at L1-2 and demonstrates normal morphology and signal characteristics. At least 4 intermediate T1 and T2 intradural, extramedullary nodules at the conus medullaris and cauda equina from L1-2 at L3, largest measuring 14 mm. SOFT TISSUES: Included prevertebral and paraspinal soft tissues are nonacute. 13 mm RIGHT adrenal nodule. LEVEL BY LEVEL EVALUATION: (severely motion degraded axial T1 limits evaluation) L1 to in L2-3: No disc bulge, canal stenosis nor neural foraminal narrowing. L3-4: Annular bulging. Moderate facet arthropathy and ligamentum flavum redundancy without canal stenosis or neural foraminal narrowing. L4-5: Anterolisthesis. Small broad-based disc bulge. Severe facet arthropathy and ligamentum flavum redundancy with trace facet effusions which are likely reactive. Moderate to severe canal stenosis. Mild RIGHT, moderate LEFT neural foraminal narrowing. L5-S1: Annular bulging. Severe facet arthropathy and ligamentum flavum redundancy without canal stenosis. Mild RIGHT, moderate LEFT neural foraminal narrowing. IMPRESSION: MR THORACIC SPINE IMPRESSION Normal MRI of the thoracic spine. Partially imaged LEFT supraclavicular lymphadenopathy highly concerning for metastatic disease in patient with history of breast cancer. MR LUMBAR SPINE IMPRESSION At least 4 intradural, extramedullary  nodules highly concerning for drop metastasis, incompletely assessed on this motion degraded noncontrast examination. Grade 1 L4-5 anterolisthesis without spondylolysis. Moderate to severe canal stenosis at L4-5. Moderate LEFT L4-5 and L5-S1 neural foraminal narrowing. 13 mm RIGHT adrenal nodule. Recommend comparison with prior imaging to assess stability (none available at present time) ; in patient with history of cancer, metastasis not excluded. Electronically Signed   By: Elon Alas M.D.   On: 10/18/2015 00:57    Francile Woolford, DO  [facility] Pager [phone]  If 7PM-7AM, please contact night-coverage [email or web address] Password [facility] 10/19/2015, 6:19 PM

## 2015-10-19 NOTE — Care Management Note (Signed)
Case Management Note  Patient Details  Name: IANTHE FRADY MRN: [id number] Date of Birth: 08/14/1945  Subjective/Objective: 70 y.o. F admitted 10/17/2015 for progressive weakness related to her Metastatic Stage IV  Breast Cancer. Difficult to talk to the pt about plans as she has Mets to the brain and now spine. Dr Tat has consulted (6/21) with Palliative Care to assist this family with GOC as it is clearly documented she is unable to tolerate additional chemotherapy. Palliative Radiation has been completed in the past and is again offered as an option, but of course can be done on an outpt basis. CM is available to assist this family with Hospice at Home or assist with referral to SW for a Hospice facility if this meets the immediate needs/desires of the patient.                   Action/Plan: CM will continue to follow for disposition.    Expected Discharge Date:                  Expected Discharge Plan:     In-House Referral:     Discharge planning Services  CM Consult  Post Acute Care Choice:    Choice offered to:     DME Arranged:    DME Agency:     HH Arranged:    HH Agency:     Status of Service:  In process, will continue to follow  If discussed at Long Length of Stay Meetings, dates discussed:    Additional Comments:  Delrae Sawyers, RN 10/19/2015, 2:00 PM

## 2015-10-20 ENCOUNTER — Telehealth: Payer: Self-pay

## 2015-10-20 DIAGNOSIS — Z515 Encounter for palliative care: Secondary | ICD-10-CM

## 2015-10-20 DIAGNOSIS — Z7189 Other specified counseling: Secondary | ICD-10-CM

## 2015-10-20 MED ORDER — DEXAMETHASONE 4 MG PO TABS: 4.0000 mg | ORAL_TABLET | Freq: Two times a day (BID) | ORAL | Status: AC

## 2015-10-20 MED ORDER — HEPARIN SOD (PORK) LOCK FLUSH 100 UNIT/ML IV SOLN
500.0000 [IU] | INTRAVENOUS | Status: AC | PRN
  Administered 2015-10-20: 500 [IU]

## 2015-10-20 NOTE — Progress Notes (Addendum)
Physical Therapy Treatment Patient Details Name: Robin Cantrell MRN: [id number] DOB: 01/23/1946 Today's Date: 10/20/2015    History of Present Illness Patient is a 70 y/o female with hx of breast ca with mets to the liver, bones, HTN and CVA presents with BLE weakness, incontinence, and falls. Concern for cauda equina syndrome. 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  Non surgical management and hospice care.      PT Comments    Pt progressing well with mobility increasing her gait distance with bil hand held assist today.  Legs continue to show signs of weakness with fatigue and pt would benefit from further therapy acutely. She plans to d/c home with home hospice, ambulance transport and family's assist.    Follow Up Recommendations  Other (comment) (going home with hospice)     Equipment Recommendations  Wheelchair (measurements PT);Wheelchair cushion (measurements PT)    Recommendations for Other Services   NA     Precautions / Restrictions Precautions Precautions: Fall    Mobility  Bed Mobility               General bed mobility comments: OOB in chair  Transfers Overall transfer level: Needs assistance Equipment used: 2 person hand held assist Transfers: Sit to/from Stand Sit to Stand: +2 safety/equipment;Min assist         General transfer comment: Two person for safety during transitions.  Verbal and tactile cues for safe hand placement on armrests of chair.   Ambulation/Gait Ambulation/Gait assistance: +2 physical assistance;Mod assist Ambulation Distance (Feet): 110 Feet Assistive device: 2 person hand held assist Gait Pattern/deviations: Shuffle;Step-through pattern;Decreased stance time - right;Staggering right (increased knee flexion with  fatigue bil) Gait velocity: too fast to be safe   General Gait Details: Pt needed mod hand held assist for balance and stability of trunk over weak legs.  At times her trunk would get ahead of her feet producing an anterior LOB moment as well as leaning to the right.  Chair to follow for increased safety and to encourage increased gait distance.           Balance Overall balance assessment: Needs assistance Sitting-balance support: Feet supported;No upper extremity supported Sitting balance-Leahy Scale: Good     Standing balance support: Bilateral upper extremity supported Standing balance-Leahy Scale: Poor                      Cognition Arousal/Alertness: Awake/alert Behavior During Therapy: WFL for tasks assessed/performed Overall Cognitive Status: Impaired/Different from baseline Area of Impairment: Safety/judgement;Awareness;Problem solving         Safety/Judgement: Decreased awareness of safety;Decreased awareness of deficits   Problem Solving: Difficulty sequencing;Requires tactile cues;Requires verbal cues General Comments: Seemed aware of her hospice and end of life situation.  Tearful at end of session.  Quick to move at times and not completely aware of her fall risk and deficits.            Pertinent Vitals/Pain Pain Assessment: No/denies pain           PT Goals (current goals can now be found in the care plan section) Acute Rehab PT Goals Patient Stated Goal: none stated Progress towards PT goals: Progressing toward goals    Frequency  Min 3X/week    PT Plan Current plan remains appropriate  End of Session Equipment Utilized During Treatment: Gait belt Activity Tolerance: Patient limited by fatigue Patient left: in chair;with chair alarm set;with call bell/phone within reach     Time: 1545-1602 PT Time Calculation (min) (ACUTE ONLY): 17 min  Charges:  $Gait Training: 8-22 mins                      Pierson Vantol B. [place], [place],  DPT [phone]   10/20/2015, 4:39 PM

## 2015-10-20 NOTE — Discharge Summary (Signed)
Physician Discharge Summary  Robin Cantrell [id number] DOB: 14-Apr-1946 DOA: 10/17/2015  PCP: Pcp Not In System  Admit date: 10/17/2015 Discharge date: 10/20/2015  Admitted From: home Disposition:  Home with hospice with focus on comfort care    Home Health:Home hospice--[facility]  Discharge Condition:stable CODE STATUS:Comfort Care Diet recommendation: regular  Brief/Interim Summary: 70 y.o. female with a past medical history significant for breast cancer T4b N0i+M1 ER/PR neg, HTN and hx of stroke who presents with falls and incontinence. Pt is a poor historian. [place] chemo 07/18/15 to 08/14/15. The patient presents with two-week history of increasing lower extremity weakness with inability to get up out of bed. In addition, the patient has had new bowel or bladder incontinence. The patient is two-day history of gait instability and falls. Dr. Charlesetta Garibaldi office was contacted who was concerned aboutconcerned about cauda equina or progression of brain metastasis and recommended hospital evaluation. MRI of lumbar spine revealed intradural extramedullary nodules concerning for drop metastasis adherent to the cauda equina.  Discharge Diagnoses:  Cauda equina syndrome:  -Dr. Loleta Books reviewed Imaging reviewed with Radiology. 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 dependently seeded metastasis from elsewhere in the neuraxis, given her diffuse cerebral metastatic disease, and could be consistent with a cauda equina syndrome.  -Patient is not a surgical candidate and no other emergent treatments are appropriate. -case discussed with Dr. Clayborne Dana good candidate for chemo -consulted palliative medicine for GOC--case discussed with Dr. 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 No history of bleeding or melena.    Discharge Instructions  Discharge Instructions    Diet - low sodium heart healthy    Complete by:  As directed      Increase activity slowly    Complete by:  As directed             Medication List    STOP taking these medications        aspirin EC 81 MG tablet     simvastatin 10 MG tablet  Commonly known as:  ZOCOR      TAKE these medications        dexamethasone 4 MG tablet  Commonly known as:  DECADRON  Take 1 tablet (4 mg total) by mouth 2 (two) times daily with a meal.     feeding supplement (ENSURE ENLIVE) Liqd  Take 237 mLs by mouth 2 (two) times daily between meals. Or alternative formulary at facility        No Known Allergies  Consultations:  Radiation onc-[facility]  Med onc--[facility]  Palliative--[facility]   Procedures/Studies: Dg Chest 2 View  10/17/2015  CLINICAL DATA:  Fatigue for 1 week, history of left breast cancer with mastectomy EXAM: CHEST  2 VIEW COMPARISON:  None. FINDINGS: Cardiomediastinal silhouette is unremarkable. No acute infiltrate or  pleural effusion. No pulmonary edema. There is right subclavian Port-A-Cath with tip in SVC. The patient is status post left mastectomy and left axillary lymph node dissection. No pneumothorax. IMPRESSION: No active cardiopulmonary disease. Right Port-A-Cath in  place. No pneumothorax. Electronically Signed   By: Lahoma Crocker M.D.   On: 10/17/2015 20:18   Ct Head Wo Contrast  10/17/2015  CLINICAL DATA:  Confusion.  Mental status change. EXAM: CT HEAD WITHOUT CONTRAST TECHNIQUE: Contiguous axial images were obtained from the base of the skull through the vertex without intravenous contrast. COMPARISON:  None. FINDINGS: The ventricles are in the midline without mass effect or shift. No extra-axial fluid collections are identified. There is fairly extensive, age advanced, periventricular white matter disease which is likely microvascular ischemic change. Probable remote cortical infarct involving the left frontal parietal area and also the right occipital area. No findings for acute hemispheric infarction or intracranial hemorrhage. The brainstem and cerebellum are grossly normal. There is a calcified extra-axial mass in the right parietal area which measures 3 cm. This is most likely a benign meningioma. The bony structures are intact. The paranasal sinuses and mastoid air cells are clear. The globes are intact. IMPRESSION: Severe age advanced periventricular white matter disease and probable remote infarcts involving the left frontal parietal area and right occipital area. 3 cm calcified extra-axial mass in the right parietal area, most likely a benign meningioma. Electronically Signed   By: Marijo Sanes M.D.   On: 10/17/2015 20:55   Mr Thoracic Spine Wo Contrast  10/18/2015  CLINICAL DATA:  Weakness for 2 days. History of breast cancer and suspected brain metastasis. Worsening lower extremity weakness, concern for spinal compression. Bowel and bladder incontinence, recurrent falls. EXAM: MRI THORACIC AND LUMBAR SPINE WITHOUT CONTRAST TECHNIQUE: Multiplanar and multiecho pulse sequences of the thoracic and lumbar spine were obtained without intravenous contrast. Interpreting radiologist requested gadolinium administration, however patient could not remain still, and  discontinued the examination. COMPARISON:  None available for comparison at time of study interpretation. FINDINGS: MRI THORACIC SPINE FINDINGS ALIGNMENT: Maintenance of the thoracic kyphosis. No malalignment. VERTEBRAE/DISCS: Vertebral bodies are intact. Intervertebral disc morphology's and signal are normal. CORD: Thoracic spinal cord is normal morphology and signal characteristics to the level the conus medullaris which terminates at T12-L1. PREVERTEBRAL AND PARASPINAL SOFT TISSUES: Bilateral dependent atelectasis. Partially imaged LEFT supraclavicular lymphadenopathy measuring up to 14 mm short axis. DEGENERATIVE CHANGE: No degenerative change for age. No neurocompression. MRI LUMBAR SPINE FINDINGS OSSEOUS STRUCTURES: Lumbar vertebral bodies intact. Maintenance of lumbar lordosis. Grade 1 L4-5 anterolisthesis without spondylolysis. Mild L4-5 disc height loss. Decreased T2 signal within all lumbar discs compatible with mild desiccation. No suspicious bone marrow signal. SPINAL CORD: Conus medullaris terminates at L1-2 and demonstrates normal morphology and signal characteristics. At least 4 intermediate T1 and T2 intradural, extramedullary nodules at the conus medullaris and cauda equina from L1-2 at L3, largest measuring 14 mm. SOFT TISSUES: Included prevertebral and paraspinal soft tissues are nonacute. 13 mm RIGHT adrenal nodule. LEVEL BY LEVEL EVALUATION: (severely motion degraded axial T1 limits evaluation) L1 to in L2-3: No disc bulge, canal stenosis nor neural foraminal narrowing. L3-4: Annular bulging. Moderate facet arthropathy and ligamentum flavum redundancy without canal stenosis or neural foraminal narrowing. L4-5: Anterolisthesis. Small broad-based disc bulge. Severe facet arthropathy and ligamentum flavum redundancy with trace facet effusions which are likely reactive. Moderate to severe canal stenosis. Mild RIGHT, moderate LEFT neural foraminal narrowing. L5-S1: Annular bulging. Severe facet  arthropathy and ligamentum flavum redundancy  without canal stenosis. Mild RIGHT, moderate LEFT neural foraminal narrowing. IMPRESSION: MR THORACIC SPINE IMPRESSION Normal MRI of the thoracic spine. Partially imaged LEFT supraclavicular lymphadenopathy highly concerning for metastatic disease in patient with history of breast cancer. MR LUMBAR SPINE IMPRESSION At least 4 intradural, extramedullary nodules highly concerning for drop metastasis, incompletely assessed on this motion degraded noncontrast examination. Grade 1 L4-5 anterolisthesis without spondylolysis. Moderate to severe canal stenosis at L4-5. Moderate LEFT L4-5 and L5-S1 neural foraminal narrowing. 13 mm RIGHT adrenal nodule. Recommend comparison with prior imaging to assess stability (none available at present time) ; in patient with history of cancer, metastasis not excluded. Electronically Signed   By: Elon Alas M.D.   On: 10/18/2015 00:57   Mr Lumbar Spine Wo Contrast  10/18/2015  CLINICAL DATA:  Weakness for 2 days. History of breast cancer and suspected brain metastasis. Worsening lower extremity weakness, concern for spinal compression. Bowel and bladder incontinence, recurrent falls. EXAM: MRI THORACIC AND LUMBAR SPINE WITHOUT CONTRAST TECHNIQUE: Multiplanar and multiecho pulse sequences of the thoracic and lumbar spine were obtained without intravenous contrast. Interpreting radiologist requested gadolinium administration, however patient could not remain still, and discontinued the examination. COMPARISON:  None available for comparison at time of study interpretation. FINDINGS: MRI THORACIC SPINE FINDINGS ALIGNMENT: Maintenance of the thoracic kyphosis. No malalignment. VERTEBRAE/DISCS: Vertebral bodies are intact. Intervertebral disc morphology's and signal are normal. CORD: Thoracic spinal cord is normal morphology and signal characteristics to the level the conus medullaris which terminates at T12-L1. PREVERTEBRAL AND PARASPINAL  SOFT TISSUES: Bilateral dependent atelectasis. Partially imaged LEFT supraclavicular lymphadenopathy measuring up to 14 mm short axis. DEGENERATIVE CHANGE: No degenerative change for age. No neurocompression. MRI LUMBAR SPINE FINDINGS OSSEOUS STRUCTURES: Lumbar vertebral bodies intact. Maintenance of lumbar lordosis. Grade 1 L4-5 anterolisthesis without spondylolysis. Mild L4-5 disc height loss. Decreased T2 signal within all lumbar discs compatible with mild desiccation. No suspicious bone marrow signal. SPINAL CORD: Conus medullaris terminates at L1-2 and demonstrates normal morphology and signal characteristics. At least 4 intermediate T1 and T2 intradural, extramedullary nodules at the conus medullaris and cauda equina from L1-2 at L3, largest measuring 14 mm. SOFT TISSUES: Included prevertebral and paraspinal soft tissues are nonacute. 13 mm RIGHT adrenal nodule. LEVEL BY LEVEL EVALUATION: (severely motion degraded axial T1 limits evaluation) L1 to in L2-3: No disc bulge, canal stenosis nor neural foraminal narrowing. L3-4: Annular bulging. Moderate facet arthropathy and ligamentum flavum redundancy without canal stenosis or neural foraminal narrowing. L4-5: Anterolisthesis. Small broad-based disc bulge. Severe facet arthropathy and ligamentum flavum redundancy with trace facet effusions which are likely reactive. Moderate to severe canal stenosis. Mild RIGHT, moderate LEFT neural foraminal narrowing. L5-S1: Annular bulging. Severe facet arthropathy and ligamentum flavum redundancy without canal stenosis. Mild RIGHT, moderate LEFT neural foraminal narrowing. IMPRESSION: MR THORACIC SPINE IMPRESSION Normal MRI of the thoracic spine. Partially imaged LEFT supraclavicular lymphadenopathy highly concerning for metastatic disease in patient with history of breast cancer. MR LUMBAR SPINE IMPRESSION At least 4 intradural, extramedullary nodules highly concerning for drop metastasis, incompletely assessed on this  motion degraded noncontrast examination. Grade 1 L4-5 anterolisthesis without spondylolysis. Moderate to severe canal stenosis at L4-5. Moderate LEFT L4-5 and L5-S1 neural foraminal narrowing. 13 mm RIGHT adrenal nodule. Recommend comparison with prior imaging to assess stability (none available at present time) ; in patient with history of cancer, metastasis not excluded. Electronically Signed   By: Elon Alas M.D.   On: 10/18/2015 00:57  Discharge Exam: Filed Vitals:   10/20/15 0500 10/20/15 1009  BP: 115/77 129/79  Pulse: 75 70  Temp: 98.1 F (36.7 C) 97.5 F (36.4 C)  Resp: 16 16   Filed Vitals:   10/19/15 2134 10/20/15 0106 10/20/15 0500 10/20/15 1009  BP: 126/84 132/74 115/77 129/79  Pulse:  75 75 70  Temp: 98.3 F (36.8 C) 98 F (36.7 C) 98.1 F (36.7 C) 97.5 F (36.4 C)  TempSrc: Oral Oral Oral Oral  Resp: 18 18 16 16   Height:      Weight:      SpO2: 98% 97% 98% 99%    General: Pt is alert, awake, not in acute distress Cardiovascular: RRR, S1/S2 +, no rubs, no gallops Respiratory: CTA bilaterally, no wheezing, no rhonchi Abdominal: Soft, NT, ND, bowel sounds + Extremities: no edema, no cyanosis   The results of significant diagnostics from this hospitalization (including imaging, microbiology, ancillary and laboratory) are listed below for reference.    Significant Diagnostic Studies: Dg Chest 2 View  10/17/2015  CLINICAL DATA:  Fatigue for 1 week, history of left breast cancer with mastectomy EXAM: CHEST  2 VIEW COMPARISON:  None. FINDINGS: Cardiomediastinal silhouette is unremarkable. No acute infiltrate or pleural effusion. No pulmonary edema. There is right subclavian Port-A-Cath with tip in SVC. The patient is status post left mastectomy and left axillary lymph node dissection. No pneumothorax. IMPRESSION: No active cardiopulmonary disease. Right Port-A-Cath in place. No pneumothorax. Electronically Signed   By: Lahoma Crocker M.D.   On: 10/17/2015  20:18   Ct Head Wo Contrast  10/17/2015  CLINICAL DATA:  Confusion.  Mental status change. EXAM: CT HEAD WITHOUT CONTRAST TECHNIQUE: Contiguous axial images were obtained from the base of the skull through the vertex without intravenous contrast. COMPARISON:  None. FINDINGS: The ventricles are in the midline without mass effect or shift. No extra-axial fluid collections are identified. There is fairly extensive, age advanced, periventricular white matter disease which is likely microvascular ischemic change. Probable remote cortical infarct involving the left frontal parietal area and also the right occipital area. No findings for acute hemispheric infarction or intracranial hemorrhage. The brainstem and cerebellum are grossly normal. There is a calcified extra-axial mass in the right parietal area which measures 3 cm. This is most likely a benign meningioma. The bony structures are intact. The paranasal sinuses and mastoid air cells are clear. The globes are intact. IMPRESSION: Severe age advanced periventricular white matter disease and probable remote infarcts involving the left frontal parietal area and right occipital area. 3 cm calcified extra-axial mass in the right parietal area, most likely a benign meningioma. Electronically Signed   By: Marijo Sanes M.D.   On: 10/17/2015 20:55   Mr Thoracic Spine Wo Contrast  10/18/2015  CLINICAL DATA:  Weakness for 2 days. History of breast cancer and suspected brain metastasis. Worsening lower extremity weakness, concern for spinal compression. Bowel and bladder incontinence, recurrent falls. EXAM: MRI THORACIC AND LUMBAR SPINE WITHOUT CONTRAST TECHNIQUE: Multiplanar and multiecho pulse sequences of the thoracic and lumbar spine were obtained without intravenous contrast. Interpreting radiologist requested gadolinium administration, however patient could not remain still, and discontinued the examination. COMPARISON:  None available for comparison at time of  study interpretation. FINDINGS: MRI THORACIC SPINE FINDINGS ALIGNMENT: Maintenance of the thoracic kyphosis. No malalignment. VERTEBRAE/DISCS: Vertebral bodies are intact. Intervertebral disc morphology's and signal are normal. CORD: Thoracic spinal cord is normal morphology and signal characteristics to the level the conus medullaris which terminates at  T12-L1. PREVERTEBRAL AND PARASPINAL SOFT TISSUES: Bilateral dependent atelectasis. Partially imaged LEFT supraclavicular lymphadenopathy measuring up to 14 mm short axis. DEGENERATIVE CHANGE: No degenerative change for age. No neurocompression. MRI LUMBAR SPINE FINDINGS OSSEOUS STRUCTURES: Lumbar vertebral bodies intact. Maintenance of lumbar lordosis. Grade 1 L4-5 anterolisthesis without spondylolysis. Mild L4-5 disc height loss. Decreased T2 signal within all lumbar discs compatible with mild desiccation. No suspicious bone marrow signal. SPINAL CORD: Conus medullaris terminates at L1-2 and demonstrates normal morphology and signal characteristics. At least 4 intermediate T1 and T2 intradural, extramedullary nodules at the conus medullaris and cauda equina from L1-2 at L3, largest measuring 14 mm. SOFT TISSUES: Included prevertebral and paraspinal soft tissues are nonacute. 13 mm RIGHT adrenal nodule. LEVEL BY LEVEL EVALUATION: (severely motion degraded axial T1 limits evaluation) L1 to in L2-3: No disc bulge, canal stenosis nor neural foraminal narrowing. L3-4: Annular bulging. Moderate facet arthropathy and ligamentum flavum redundancy without canal stenosis or neural foraminal narrowing. L4-5: Anterolisthesis. Small broad-based disc bulge. Severe facet arthropathy and ligamentum flavum redundancy with trace facet effusions which are likely reactive. Moderate to severe canal stenosis. Mild RIGHT, moderate LEFT neural foraminal narrowing. L5-S1: Annular bulging. Severe facet arthropathy and ligamentum flavum redundancy without canal stenosis. Mild RIGHT, moderate  LEFT neural foraminal narrowing. IMPRESSION: MR THORACIC SPINE IMPRESSION Normal MRI of the thoracic spine. Partially imaged LEFT supraclavicular lymphadenopathy highly concerning for metastatic disease in patient with history of breast cancer. MR LUMBAR SPINE IMPRESSION At least 4 intradural, extramedullary nodules highly concerning for drop metastasis, incompletely assessed on this motion degraded noncontrast examination. Grade 1 L4-5 anterolisthesis without spondylolysis. Moderate to severe canal stenosis at L4-5. Moderate LEFT L4-5 and L5-S1 neural foraminal narrowing. 13 mm RIGHT adrenal nodule. Recommend comparison with prior imaging to assess stability (none available at present time) ; in patient with history of cancer, metastasis not excluded. Electronically Signed   By: Elon Alas M.D.   On: 10/18/2015 00:57   Mr Lumbar Spine Wo Contrast  10/18/2015  CLINICAL DATA:  Weakness for 2 days. History of breast cancer and suspected brain metastasis. Worsening lower extremity weakness, concern for spinal compression. Bowel and bladder incontinence, recurrent falls. EXAM: MRI THORACIC AND LUMBAR SPINE WITHOUT CONTRAST TECHNIQUE: Multiplanar and multiecho pulse sequences of the thoracic and lumbar spine were obtained without intravenous contrast. Interpreting radiologist requested gadolinium administration, however patient could not remain still, and discontinued the examination. COMPARISON:  None available for comparison at time of study interpretation. FINDINGS: MRI THORACIC SPINE FINDINGS ALIGNMENT: Maintenance of the thoracic kyphosis. No malalignment. VERTEBRAE/DISCS: Vertebral bodies are intact. Intervertebral disc morphology's and signal are normal. CORD: Thoracic spinal cord is normal morphology and signal characteristics to the level the conus medullaris which terminates at T12-L1. PREVERTEBRAL AND PARASPINAL SOFT TISSUES: Bilateral dependent atelectasis. Partially imaged LEFT supraclavicular  lymphadenopathy measuring up to 14 mm short axis. DEGENERATIVE CHANGE: No degenerative change for age. No neurocompression. MRI LUMBAR SPINE FINDINGS OSSEOUS STRUCTURES: Lumbar vertebral bodies intact. Maintenance of lumbar lordosis. Grade 1 L4-5 anterolisthesis without spondylolysis. Mild L4-5 disc height loss. Decreased T2 signal within all lumbar discs compatible with mild desiccation. No suspicious bone marrow signal. SPINAL CORD: Conus medullaris terminates at L1-2 and demonstrates normal morphology and signal characteristics. At least 4 intermediate T1 and T2 intradural, extramedullary nodules at the conus medullaris and cauda equina from L1-2 at L3, largest measuring 14 mm. SOFT TISSUES: Included prevertebral and paraspinal soft tissues are nonacute. 13 mm RIGHT adrenal nodule. LEVEL BY LEVEL EVALUATION: (severely motion  degraded axial T1 limits evaluation) L1 to in L2-3: No disc bulge, canal stenosis nor neural foraminal narrowing. L3-4: Annular bulging. Moderate facet arthropathy and ligamentum flavum redundancy without canal stenosis or neural foraminal narrowing. L4-5: Anterolisthesis. Small broad-based disc bulge. Severe facet arthropathy and ligamentum flavum redundancy with trace facet effusions which are likely reactive. Moderate to severe canal stenosis. Mild RIGHT, moderate LEFT neural foraminal narrowing. L5-S1: Annular bulging. Severe facet arthropathy and ligamentum flavum redundancy without canal stenosis. Mild RIGHT, moderate LEFT neural foraminal narrowing. IMPRESSION: MR THORACIC SPINE IMPRESSION Normal MRI of the thoracic spine. Partially imaged LEFT supraclavicular lymphadenopathy highly concerning for metastatic disease in patient with history of breast cancer. MR LUMBAR SPINE IMPRESSION At least 4 intradural, extramedullary nodules highly concerning for drop metastasis, incompletely assessed on this motion degraded noncontrast examination. Grade 1 L4-5 anterolisthesis without  spondylolysis. Moderate to severe canal stenosis at L4-5. Moderate LEFT L4-5 and L5-S1 neural foraminal narrowing. 13 mm RIGHT adrenal nodule. Recommend comparison with prior imaging to assess stability (none available at present time) ; in patient with history of cancer, metastasis not excluded. Electronically Signed   By: Elon Alas M.D.   On: 10/18/2015 00:57     Microbiology: No results found for this or any previous visit (from the past 240 hour(s)).   Labs: Basic Metabolic Panel:  Recent Labs Lab 10/17/15 2144 10/18/15 0520 10/19/15 0425  NA 136  --  136  K 4.2  --  4.5  CL 107  --  105  CO2 19*  --  20*  GLUCOSE 97  --  150*  BUN 16  --  20  CREATININE 0.91 0.59 0.84  CALCIUM 9.4  --  9.2   Liver Function Tests: No results for input(s): AST, ALT, ALKPHOS, BILITOT, PROT, ALBUMIN in the last 168 hours. No results for input(s): LIPASE, AMYLASE in the last 168 hours. No results for input(s): AMMONIA in the last 168 hours. CBC:  Recent Labs Lab 10/17/15 2144 10/18/15 0520 10/18/15 0848 10/19/15 0425  WBC 11.6* 8.6  --  11.2*  HGB 8.7* 6.7* 9.0* 8.7*  HCT 26.9* 20.6* 27.8* 27.3*  MCV 83.8 85.5  --  84.0  PLT 574* 400  --  586*   Cardiac Enzymes: No results for input(s): CKTOTAL, CKMB, CKMBINDEX, TROPONINI in the last 168 hours. BNP: Invalid input(s): POCBNP CBG: No results for input(s): GLUCAP in the last 168 hours.  Time coordinating discharge:  Greater than 30 minutes  Signed:  Merland Holness, DO [facility] Pager: [phone] 10/20/2015, 11:59 AM

## 2015-10-20 NOTE — Progress Notes (Signed)
D/c instructions reviewed with patient and family. [facility] transported patient to home.

## 2015-10-20 NOTE — Care Management Important Message (Signed)
Important Message  Patient Details  Name: Robin Cantrell MRN: [id number] Date of Birth: [birth year]   Medicare Important Message Given:  Yes    [name] 10/20/2015, 9:51 AM

## 2015-10-20 NOTE — Progress Notes (Signed)
Met with patient and her family.  She is not interested in further radiation therapy.  She would like to pursue discharge home with hospice support.  Family has prior experience with [facility].  Expressed preference for [facility].  Will ask care mgt to present options and confirm.  Would recommend continuing steroids on discharge in addition to medications as needed for comfort.  Full note to follow.  Gene Freeman, MD [facility] [phone]  

## 2015-10-20 NOTE — Progress Notes (Signed)
Pt going home w [facility]

## 2015-10-20 NOTE — Care Management Note (Addendum)
Case Management Note  Patient Details  Name: Robin Cantrell MRN: [id number] Date of Birth: Jun 09, 1945  Subjective/Objective:    Pt to discharge home with hospice, provided list of agencies and [place] was chosen.  Documentation faxed.                  Expected Discharge Plan:  [place]  Discharge planning Services  CM Consult  Post Acute Care Choice:  Hospice Choice offered to:  Adult Children  HH Arranged:  RN [place] Agency:  [facility]  Status of Service:  Completed, signed off  Girard Cooter, [place] 10/20/2015, 1:17 PM

## 2015-10-20 NOTE — Telephone Encounter (Signed)
Returned call to [facility].  Pt dc from hospital today.  Hospice MD will take over as attending.

## 2015-10-22 DIAGNOSIS — Z7189 Other specified counseling: Secondary | ICD-10-CM | POA: Insufficient documentation

## 2015-10-22 DIAGNOSIS — Z515 Encounter for palliative care: Secondary | ICD-10-CM | POA: Insufficient documentation

## 2015-10-22 LAB — TYPE AND SCREEN
ABO/RH(D): O POS
ANTIBODY SCREEN: POSITIVE
DAT, IGG: NEGATIVE
DONOR AG TYPE: NEGATIVE
Donor AG Type: NEGATIVE
PT AG Type: NEGATIVE
UNIT DIVISION: 0
Unit division: 0

## 2015-10-22 NOTE — Progress Notes (Signed)
Consultation Note Date: 10/22/2015   Patient Name: Robin Cantrell  DOB: 28-Dec-1945  MRN: [id number]  Age / Sex: 70 y.o., female  PCP: Pcp Not In System Referring Physician: No att. providers found  Reason for Consultation: Establishing goals of care  HPI/Patient Profile: 70 y.o. female  with past medical history of breast cancer T4b N0i+M1 ER/PR neg, HTN and hx of stroke admitted on 10/17/2015 with Cauda equina syndrome.   Clinical Assessment and Goals of Care: Met today with Ms. Ebbs, her niece, her son, and her daughter-in-law.  We talked about what is most important to Ms. Alford Highland. She reports this is her family, her faith, feeling as well as possible, and being at home.  We also talked at length about her clinical course this point in time. We discussed her cancer, CNS metastases, and development of cauda equina syndrome.  She and her family are aware that this is not a curable condition and that she is approaching end-of-life.  We discussed the purpose of disease modifying therapy to be adding time in quality to her life, and the fact that she is not benefiting from this does not mean that there is no care left to give. Discussed that this means we need to focus her care on her feeling as well as possible as this is how she is would live better and longer. We talked about options moving forward including pursuing radiation therapy for palliative purpose versus continuing with current treatment with steroids and pursuing placement home with the assistance of hospice.  SUMMARY OF RECOMMENDATIONS   - Patient is DNR/DNI - She is clear in her wishes not to pursue radiation therapy - She would like to pursue placement home with hospice support - Recommend continue steroids and discharge  Code Status/Advance Care Planning:  DNR    Symptom Management:   Lower extremity weakness: Continue  steroids On discharge, would recommend scripts for: - Morphine Concentrate 77m/0.5ml: 517m(0.2526msublingual every 1 hour as needed for pain or shortness of breath: Disp 60m20mLorazepam 2mg/31mconcentrated solution: 1mg (38mml) s40mingual every 4 hours as needed for anxiety: Disp 60ml - 21mol 2mg/ml s51mtion: 0.5mg (0.2521m subl40mal every 4 hours as needed for agitation or nausea: Disp 60ml    Pal27mive Prophylaxis:   Bowel Regimen and Frequent Pain Assessment  Additional Recommendations (Limitations, Scope, Preferences):  Full Comfort Care with plan to transition home with hospice support  Psycho-social/Spiritual:   Desire for further Chaplaincy support:no  Additional Recommendations: Education on Hospice  Prognosis:   < 6 weeks  Discharge Planning: Home with Hospice      Primary Diagnoses: Present on Admission:  . Cauda equina syndrome (HCC) . [place] neoplasm of overlapping sites of left female breast (HCC) . 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 BrainChardontastasis (HCC)  I haveWinslow Westviewed the medical record, interviewed the patient and family, and examined the patient. The following aspects are pertinent.  Past Medical History  Diagnosis Date  . Stroke (HCC) 2/11/17[place] Acute  Ischemic attack  . Cancer [facility])     mestatic breast   Social History   Social History  . Marital Status: Single    Spouse Name: N/A  . Number of Children: N/A  . Years of Education: N/A   Social History Main Topics  . Smoking status: Former Smoker    Types: Cigarettes    Quit date: 06/04/1993  . Smokeless tobacco: None  . Alcohol Use: No  . Drug Use: No  . Sexual Activity: Not Asked   Other Topics Concern  . None   Social History Narrative   History reviewed. No pertinent family history. Scheduled Meds: Continuous Infusions: PRN Meds:. Medications Prior to Admission:  Prior to Admission medications   Medication Sig Start Date End Date Taking? Authorizing  Provider  feeding supplement, ENSURE ENLIVE, (ENSURE ENLIVE) LIQD Take 237 mLs by mouth 2 (two) times daily between meals. Or alternative formulary at facility 06/14/15  Yes Albertine Patricia, MD  dexamethasone (DECADRON) 4 MG tablet Take 1 tablet (4 mg total) by mouth 2 (two) times daily with a meal. 10/20/15   Orson Eva, MD   No Known Allergies Review of Systems  Constitutional: Positive for activity change, appetite change, fatigue and unexpected weight change.  Gastrointestinal: Positive for diarrhea.  Neurological: Positive for weakness and numbness.    Physical Exam  General: Pt is alert, awake, not in acute distress Cardiovascular: RRR, S1/S2 +, no rubs, no gallops Respiratory: CTA bilaterally, no wheezing, no rhonchi Abdominal: Soft, NT, ND, bowel sounds + Extremities: no edema, no cyanosis  Vital Signs: BP 129/81 mmHg  Pulse 68  Temp(Src) 97.7 F (36.5 C) (Oral)  Resp 16  Ht 5' 8"  (1.727 m)  Wt 64.6 kg (142 lb 6.7 oz)  BMI 21.66 kg/m2  SpO2 99% Pain Assessment: No/denies pain   Pain Score: 0-No pain   SpO2: SpO2: 99 % O2 Device:SpO2: 99 % O2 Flow Rate: .   IO: Intake/output summary: No intake or output data in the 24 hours ending 10/22/15 1718  LBM: Last BM Date: 10/19/15 Baseline Weight: Weight: 65.772 kg (145 lb) Most recent weight: Weight: 64.6 kg (142 lb 6.7 oz)     Palliative Assessment/Data: 40%   Flowsheet Rows        Most Recent Value   Intake Tab    Referral Department  Hospitalist   Unit at Time of Referral  Med/Surg Unit   Palliative Care Primary Diagnosis  Cancer   Date Notified  10/18/15   Palliative Care Type  New Palliative care   Reason for referral  Clarify Goals of Care   Date of Admission  10/17/15   # of days IP prior to Palliative referral  1   Clinical Assessment    Psychosocial & Spiritual Assessment    Palliative Care Outcomes       Time In: 1020 Time Out: 1140 Time Total: 80 Greater than 50%  of this time was spent  counseling and coordinating care related to the above assessment and plan.  Signed by: Micheline Rough, MD   Please contact Palliative Medicine Team phone at [phone] for questions and concerns.  For individual provider: See Shea Evans

## 2015-10-30 ENCOUNTER — Other Ambulatory Visit: Payer: Self-pay | Admitting: Nurse Practitioner

## 2015-11-08 ENCOUNTER — Ambulatory Visit: Payer: Self-pay | Admitting: Hematology and Oncology

## 2016-02-28 DEATH — deceased

## 2016-12-15 IMAGING — DX DG CHEST 2V
2 series · 2 of 2 positions shown · non-contrast
Comparison: None.

CLINICAL DATA: Fatigue for 1 week, history of left breast cancer
with mastectomy

EXAM:
CHEST  2 VIEW

[chest lat]
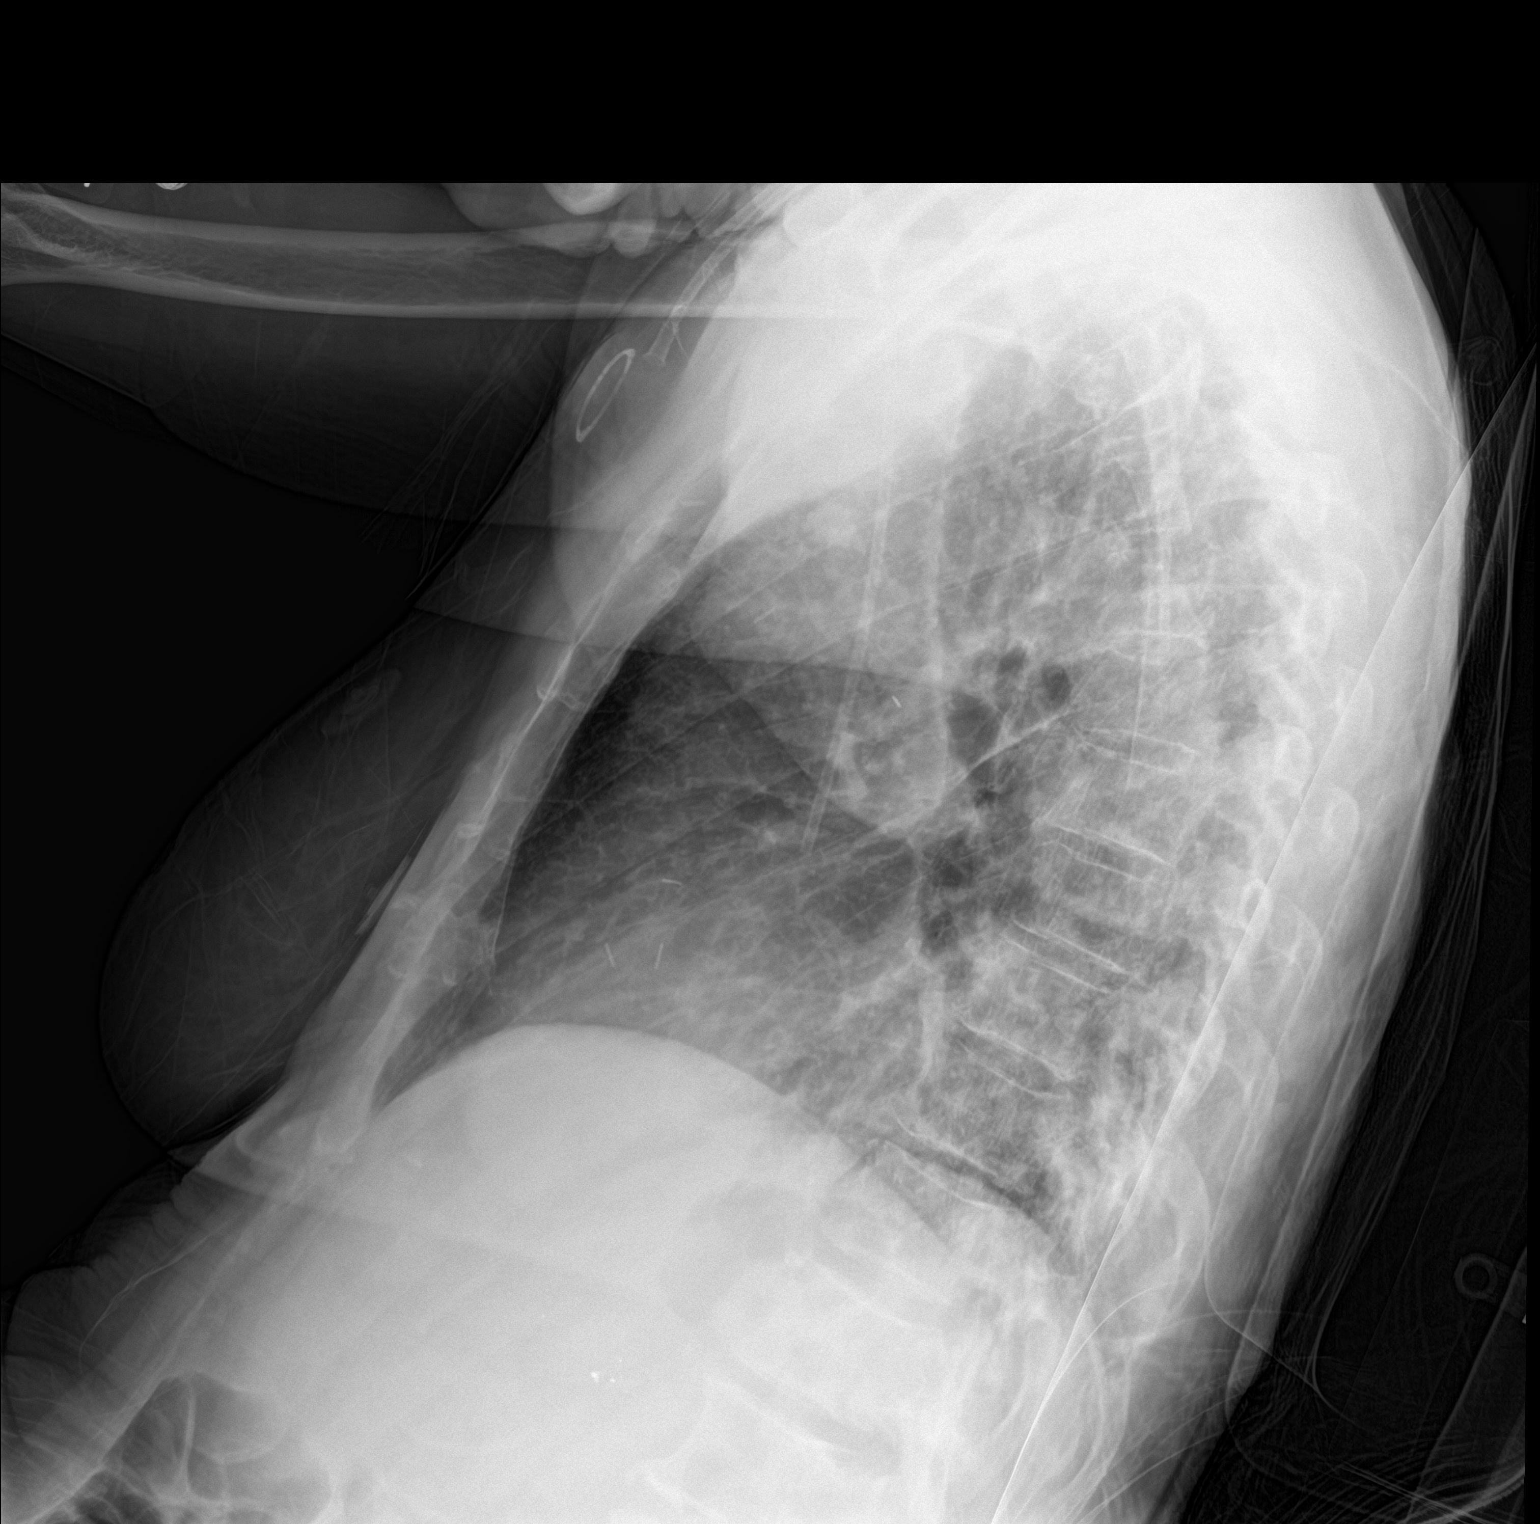

[chest ap]
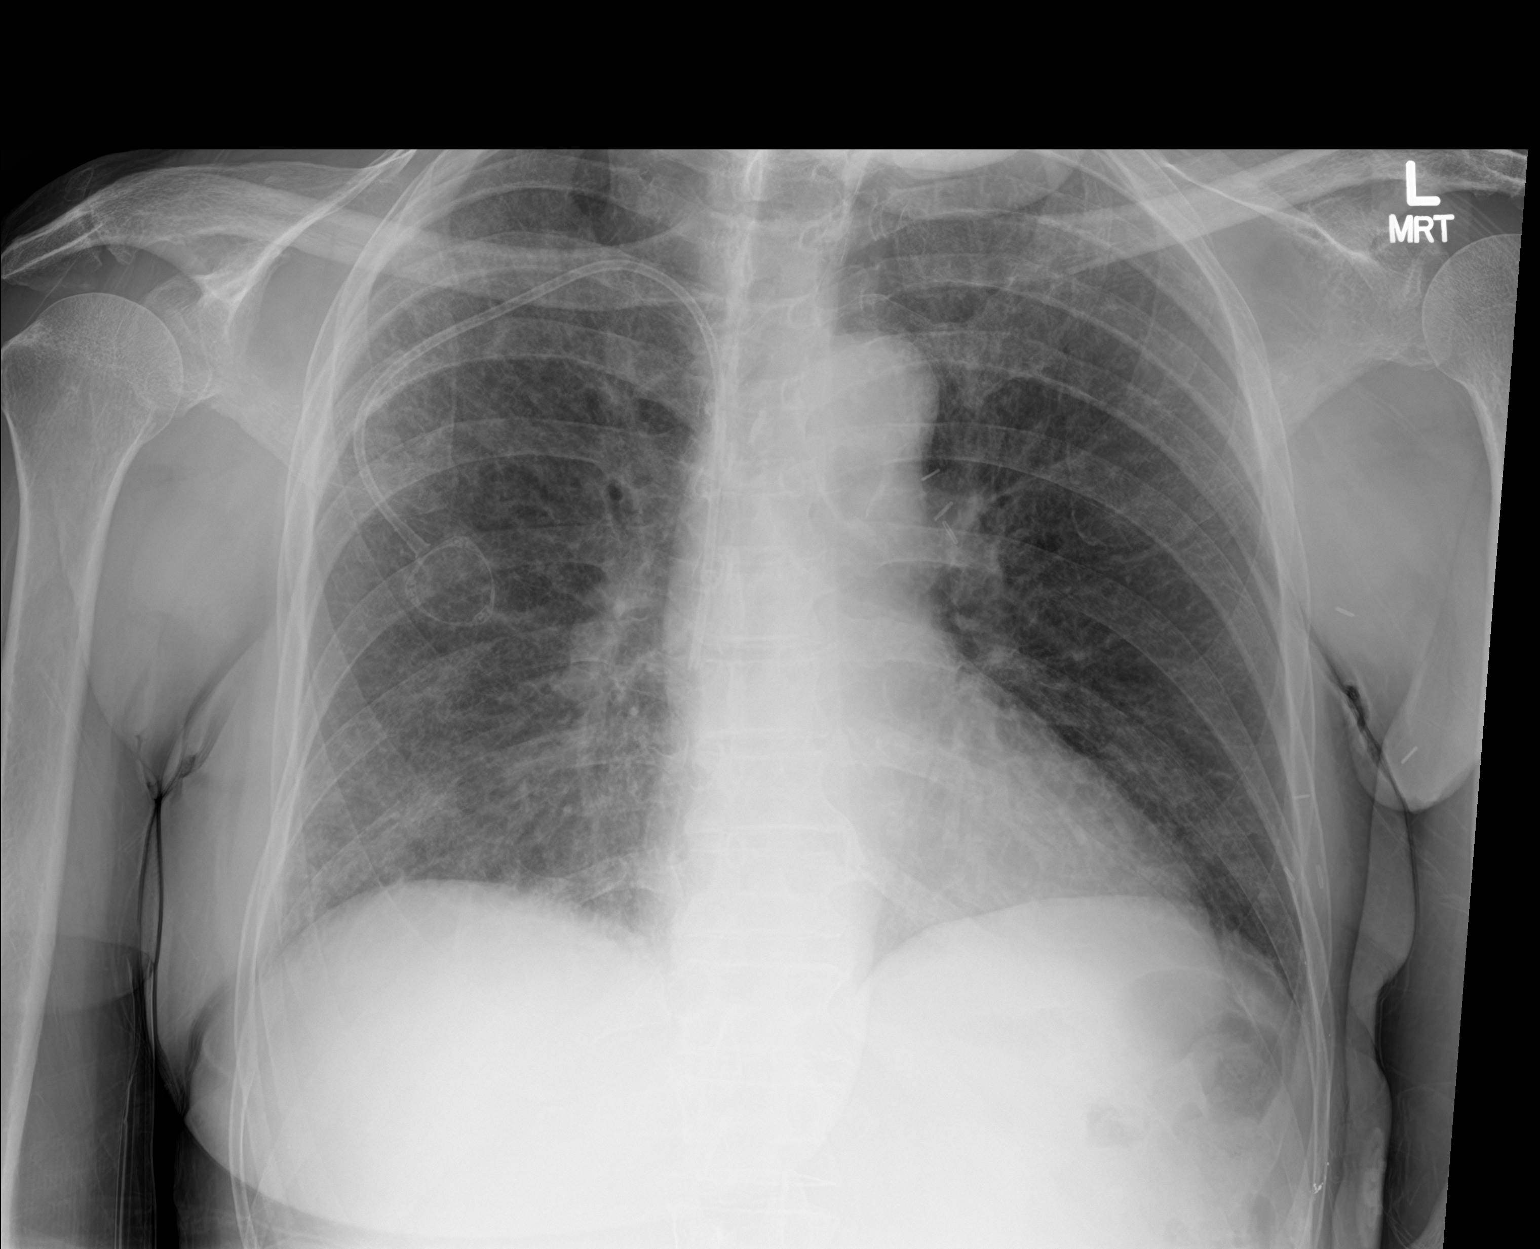

[2 of 2 positions shown; findings below may reference images not displayed]

FINDINGS: Cardiomediastinal silhouette is unremarkable. No acute infiltrate or
pleural effusion. No pulmonary edema. There is right subclavian
Port-A-Cath with tip in SVC. The patient is status post left
mastectomy and left axillary lymph node dissection. No pneumothorax.
IMPRESSION: No active cardiopulmonary disease. Right Port-A-Cath in place. No
pneumothorax.

## 2016-12-15 IMAGING — CT CT HEAD W/O CM
3 of 4 series · 16 of 47 positions shown, 19 images · non-contrast
Comparison: None.

CLINICAL DATA: Confusion.  Mental status change.

EXAM:
CT HEAD WITHOUT CONTRAST
TECHNIQUE: Contiguous axial images were obtained from the base of the skull
through the vertex without intravenous contrast.

[Series 201: head w/o, idose (1) · axial · non-contrast · 0.49mm/px · z∈[+1240,+1370]mm · 10 of 32 slices shown, 13 images]
[im 3/32  brain]
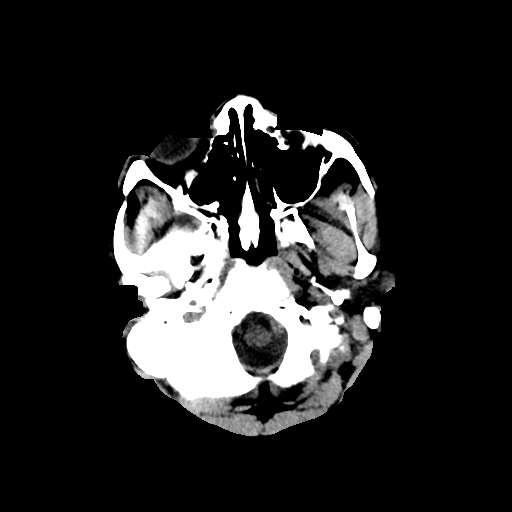
[im 3/32  bone]
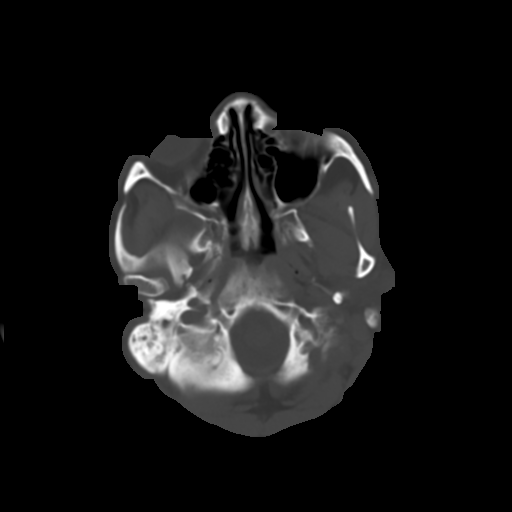
[im 5/32  brain]
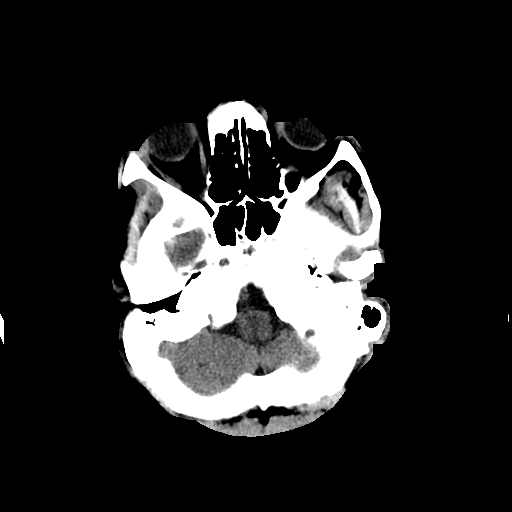
[im 9/32  brain]
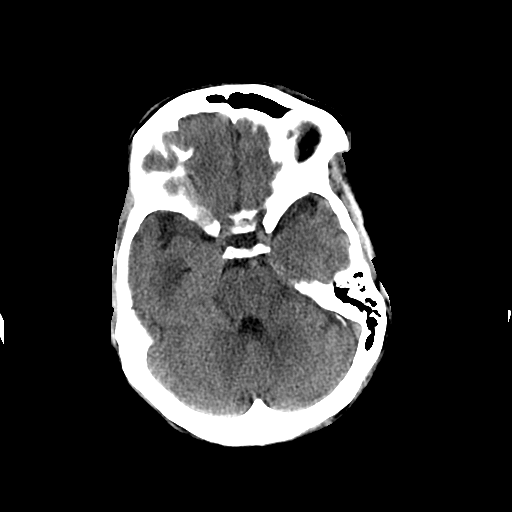
[im 12/32  brain]
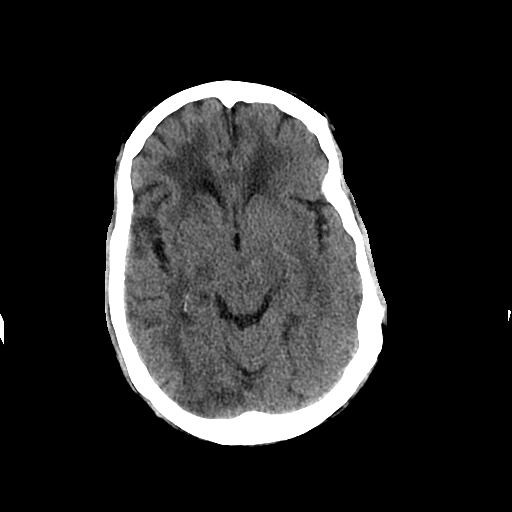
[im 14/32  brain]
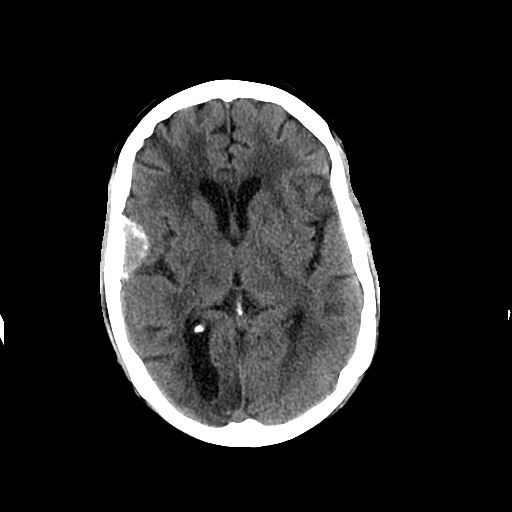
[im 14/32  bone]
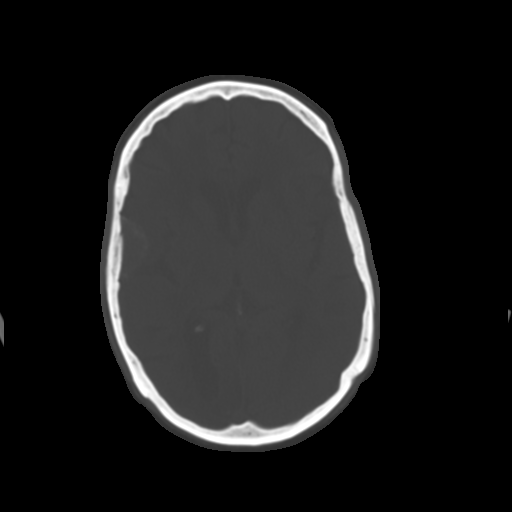
[im 18/32  brain]
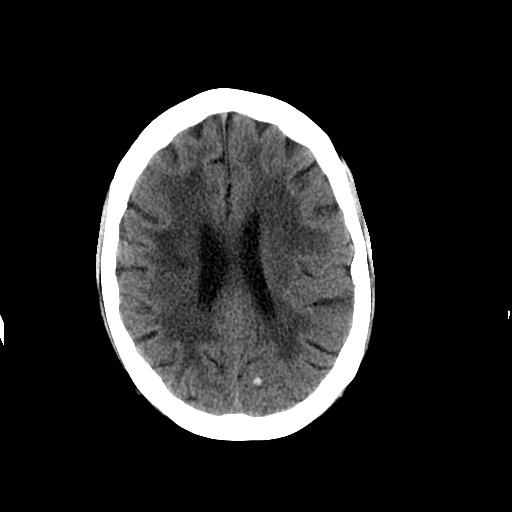
[im 20/32  brain]
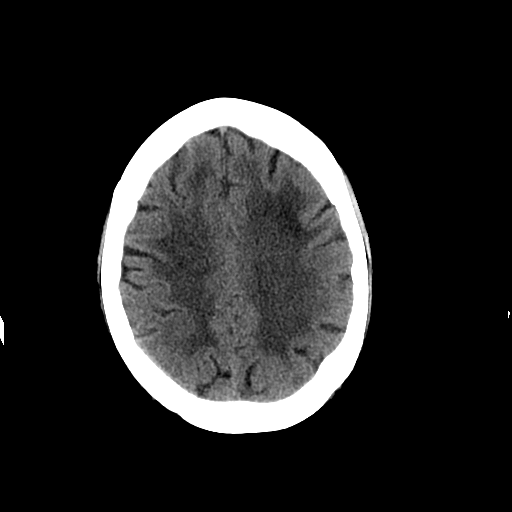
[im 23/32  brain]
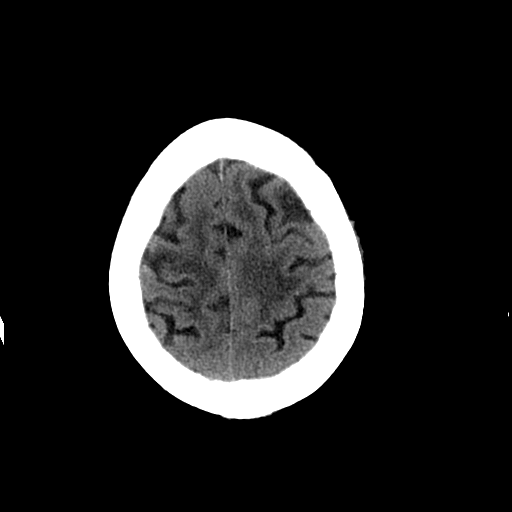
[im 27/32  brain]
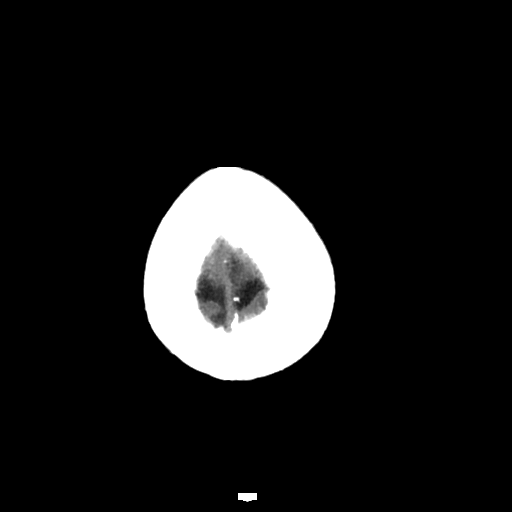
[im 27/32  bone]
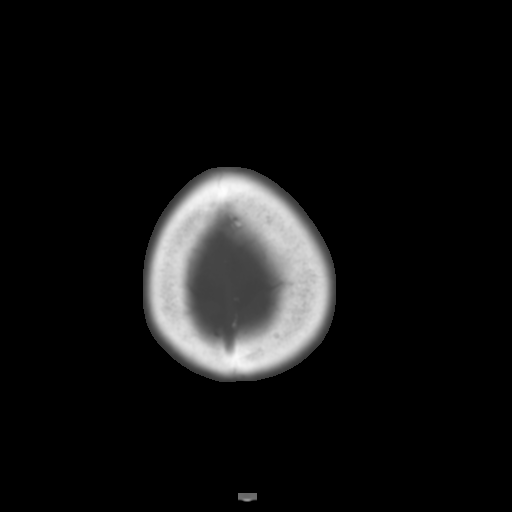
[im 29/32  brain]
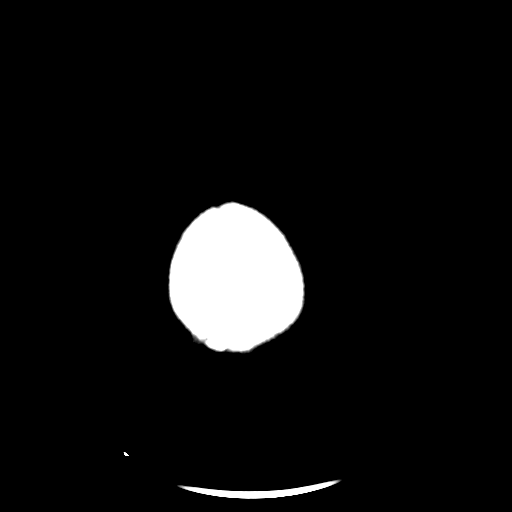

[Series 203: coronal st, idose (1) · coronal · 0.40mm/px · 3 of 71 slices shown]
[im 24/71  brain]
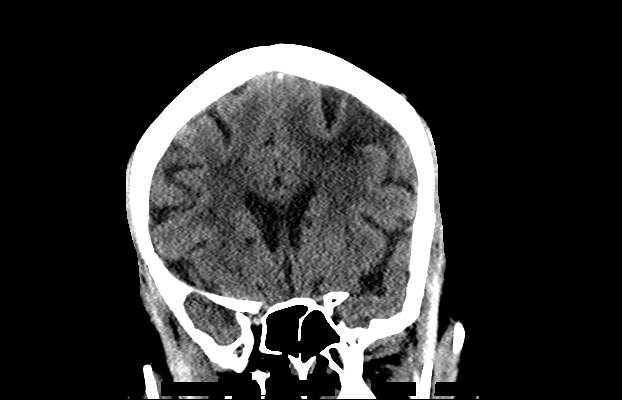
[im 32/71  brain]
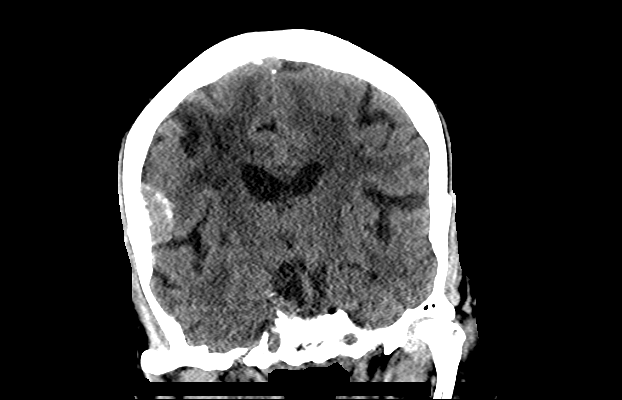
[im 39/71  brain]
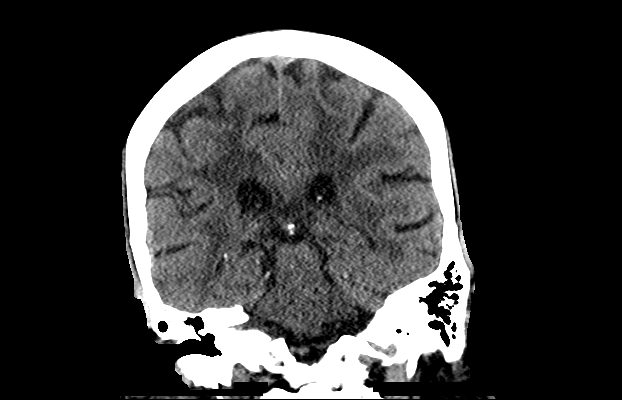

[Series 204: sagittal st, idose (1) · sagittal · 0.40mm/px · 3 of 83 slices shown]
[im 28/83  brain]
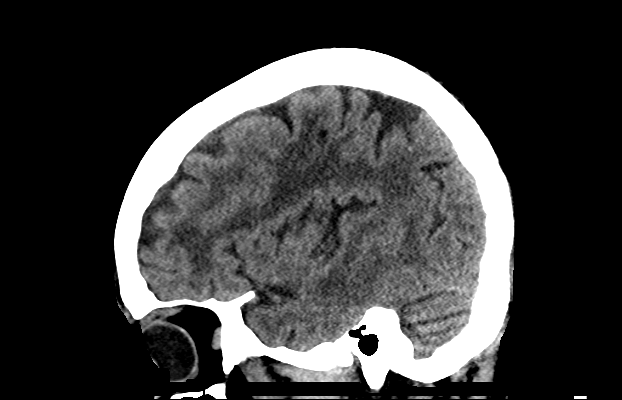
[im 42/83  brain]
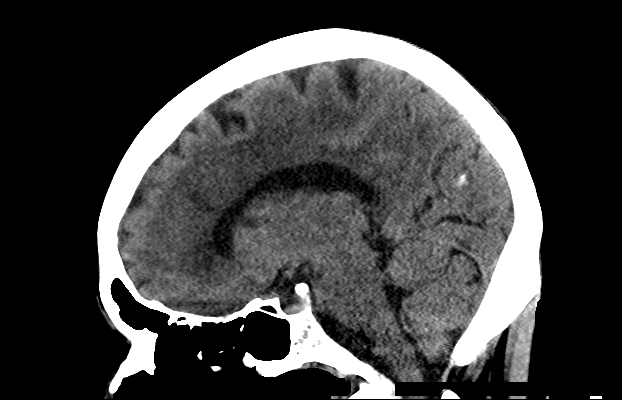
[im 55/83  brain]
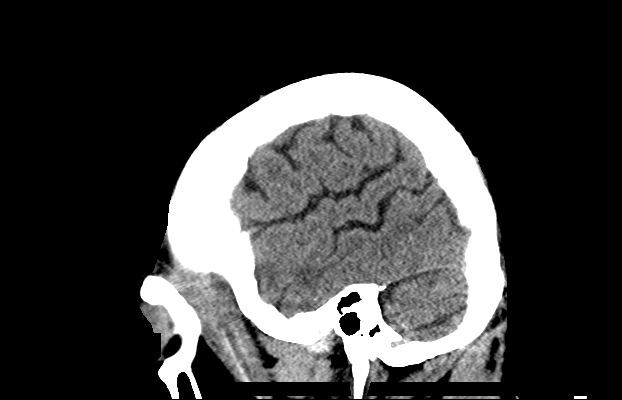

[16 of 47 positions shown; findings below may reference images not displayed]

FINDINGS: The ventricles are in the midline without mass effect or shift. No
extra-axial fluid collections are identified. There is fairly
extensive, age advanced, periventricular white matter disease which
is likely microvascular ischemic change. Probable remote cortical
infarct involving the left frontal parietal area and also the right
occipital area. No findings for acute hemispheric infarction or
intracranial hemorrhage. The brainstem and cerebellum are grossly
normal. There is a calcified extra-axial mass in the right parietal
area which measures 3 cm. This is most likely a benign meningioma.

The bony structures are intact. The paranasal sinuses and mastoid
air cells are clear. The globes are intact.
IMPRESSION: Severe age advanced periventricular white matter disease and
probable remote infarcts involving the left frontal parietal area
and right occipital area.

3 cm calcified extra-axial mass in the right parietal area, most
likely a benign meningioma.
